# Patient Record
Sex: Male | Born: 1935 | Race: White | Hispanic: No | Marital: Married | Smoking: Never smoker
Health system: Southern US, Community
[De-identification: ages and names within clinical notes are randomized; demographics above are authoritative.]

## PROBLEM LIST (undated history)

## (undated) DIAGNOSIS — M1990 Unspecified osteoarthritis, unspecified site: Secondary | ICD-10-CM

## (undated) DIAGNOSIS — D649 Anemia, unspecified: Secondary | ICD-10-CM

## (undated) DIAGNOSIS — I82409 Acute embolism and thrombosis of unspecified deep veins of unspecified lower extremity: Secondary | ICD-10-CM

## (undated) DIAGNOSIS — N189 Chronic kidney disease, unspecified: Secondary | ICD-10-CM

## (undated) DIAGNOSIS — I679 Cerebrovascular disease, unspecified: Secondary | ICD-10-CM

## (undated) DIAGNOSIS — Z87442 Personal history of urinary calculi: Secondary | ICD-10-CM

## (undated) DIAGNOSIS — R7301 Impaired fasting glucose: Secondary | ICD-10-CM

## (undated) DIAGNOSIS — Z7901 Long term (current) use of anticoagulants: Secondary | ICD-10-CM

## (undated) DIAGNOSIS — I2699 Other pulmonary embolism without acute cor pulmonale: Secondary | ICD-10-CM

## (undated) DIAGNOSIS — I482 Chronic atrial fibrillation, unspecified: Secondary | ICD-10-CM

## (undated) DIAGNOSIS — E785 Hyperlipidemia, unspecified: Secondary | ICD-10-CM

## (undated) DIAGNOSIS — I639 Cerebral infarction, unspecified: Secondary | ICD-10-CM

## (undated) DIAGNOSIS — K219 Gastro-esophageal reflux disease without esophagitis: Secondary | ICD-10-CM

## (undated) DIAGNOSIS — I499 Cardiac arrhythmia, unspecified: Secondary | ICD-10-CM

## (undated) DIAGNOSIS — I10 Essential (primary) hypertension: Secondary | ICD-10-CM

## (undated) DIAGNOSIS — B029 Zoster without complications: Secondary | ICD-10-CM

## (undated) HISTORY — DX: Anemia, unspecified: D64.9

## (undated) HISTORY — DX: Cerebrovascular disease, unspecified: I67.9

## (undated) HISTORY — PX: EYE SURGERY: SHX253

## (undated) HISTORY — PX: HEMORRHOID SURGERY: SHX153

## (undated) HISTORY — PX: JOINT REPLACEMENT: SHX530

## (undated) HISTORY — DX: Impaired fasting glucose: R73.01

## (undated) HISTORY — DX: Long term (current) use of anticoagulants: Z79.01

## (undated) HISTORY — PX: RENAL ARTERY STENT: SHX2321

## (undated) HISTORY — DX: Chronic kidney disease, unspecified: N18.9

## (undated) HISTORY — PX: HIP FRACTURE SURGERY: SHX118

## (undated) HISTORY — DX: Essential (primary) hypertension: I10

## (undated) HISTORY — PX: APPENDECTOMY: SHX54

## (undated) HISTORY — PX: CHOLECYSTECTOMY: SHX55

## (undated) HISTORY — DX: Chronic atrial fibrillation, unspecified: I48.20

## (undated) HISTORY — PX: COLONOSCOPY: SHX174

## (undated) HISTORY — PX: WISDOM TOOTH EXTRACTION: SHX21

## (undated) HISTORY — DX: Hyperlipidemia, unspecified: E78.5

## (undated) HISTORY — DX: Zoster without complications: B02.9

---

## 2001-03-09 ENCOUNTER — Emergency Department (HOSPITAL_COMMUNITY): Admission: EM | Admit: 2001-03-09 | Discharge: 2001-03-09 | Payer: Self-pay | Admitting: Emergency Medicine

## 2001-03-12 ENCOUNTER — Emergency Department (HOSPITAL_COMMUNITY): Admission: EM | Admit: 2001-03-12 | Discharge: 2001-03-12 | Payer: Self-pay | Admitting: Internal Medicine

## 2001-03-17 ENCOUNTER — Emergency Department (HOSPITAL_COMMUNITY): Admission: EM | Admit: 2001-03-17 | Discharge: 2001-03-17 | Payer: Self-pay | Admitting: *Deleted

## 2001-03-22 ENCOUNTER — Emergency Department (HOSPITAL_COMMUNITY): Admission: EM | Admit: 2001-03-22 | Discharge: 2001-03-22 | Payer: Self-pay | Admitting: *Deleted

## 2002-08-01 ENCOUNTER — Encounter: Payer: Self-pay | Admitting: Orthopedic Surgery

## 2002-08-06 ENCOUNTER — Inpatient Hospital Stay (HOSPITAL_COMMUNITY): Admission: RE | Admit: 2002-08-06 | Discharge: 2002-08-08 | Payer: Self-pay | Admitting: Orthopedic Surgery

## 2002-09-27 ENCOUNTER — Other Ambulatory Visit: Admission: RE | Admit: 2002-09-27 | Discharge: 2002-09-27 | Payer: Self-pay | Admitting: Dermatology

## 2002-11-27 ENCOUNTER — Ambulatory Visit (HOSPITAL_COMMUNITY): Admission: RE | Admit: 2002-11-27 | Discharge: 2002-11-27 | Payer: Self-pay | Admitting: Orthopedic Surgery

## 2002-11-27 ENCOUNTER — Encounter: Payer: Self-pay | Admitting: Orthopedic Surgery

## 2003-02-27 ENCOUNTER — Encounter: Payer: Self-pay | Admitting: Neurosurgery

## 2003-03-01 ENCOUNTER — Inpatient Hospital Stay (HOSPITAL_COMMUNITY): Admission: RE | Admit: 2003-03-01 | Discharge: 2003-03-02 | Payer: Self-pay | Admitting: Neurosurgery

## 2003-03-01 ENCOUNTER — Encounter: Payer: Self-pay | Admitting: Neurosurgery

## 2003-03-21 ENCOUNTER — Encounter: Payer: Self-pay | Admitting: Neurosurgery

## 2003-03-21 ENCOUNTER — Ambulatory Visit (HOSPITAL_COMMUNITY): Admission: RE | Admit: 2003-03-21 | Discharge: 2003-03-21 | Payer: Self-pay | Admitting: Neurosurgery

## 2003-05-01 ENCOUNTER — Inpatient Hospital Stay (HOSPITAL_COMMUNITY): Admission: RE | Admit: 2003-05-01 | Discharge: 2003-05-06 | Payer: Self-pay | Admitting: Orthopedic Surgery

## 2003-05-06 ENCOUNTER — Inpatient Hospital Stay (HOSPITAL_COMMUNITY)
Admission: RE | Admit: 2003-05-06 | Discharge: 2003-05-13 | Payer: Self-pay | Admitting: Physical Medicine & Rehabilitation

## 2003-07-01 ENCOUNTER — Encounter (HOSPITAL_COMMUNITY): Admission: RE | Admit: 2003-07-01 | Discharge: 2003-07-31 | Payer: Self-pay | Admitting: Orthopedic Surgery

## 2003-09-16 ENCOUNTER — Ambulatory Visit (HOSPITAL_COMMUNITY): Admission: RE | Admit: 2003-09-16 | Discharge: 2003-09-16 | Payer: Self-pay | Admitting: *Deleted

## 2003-10-03 ENCOUNTER — Ambulatory Visit (HOSPITAL_COMMUNITY): Admission: RE | Admit: 2003-10-03 | Discharge: 2003-10-03 | Payer: Self-pay | Admitting: *Deleted

## 2003-10-27 ENCOUNTER — Emergency Department (HOSPITAL_COMMUNITY): Admission: EM | Admit: 2003-10-27 | Discharge: 2003-10-27 | Payer: Self-pay | Admitting: Emergency Medicine

## 2003-11-05 ENCOUNTER — Ambulatory Visit (HOSPITAL_COMMUNITY): Admission: RE | Admit: 2003-11-05 | Discharge: 2003-11-05 | Payer: Self-pay | Admitting: Ophthalmology

## 2003-12-26 ENCOUNTER — Ambulatory Visit (HOSPITAL_COMMUNITY): Admission: RE | Admit: 2003-12-26 | Discharge: 2003-12-26 | Payer: Self-pay | Admitting: Internal Medicine

## 2004-03-16 ENCOUNTER — Ambulatory Visit (HOSPITAL_BASED_OUTPATIENT_CLINIC_OR_DEPARTMENT_OTHER): Admission: RE | Admit: 2004-03-16 | Discharge: 2004-03-16 | Payer: Self-pay | Admitting: Urology

## 2004-05-18 ENCOUNTER — Ambulatory Visit: Payer: Self-pay | Admitting: *Deleted

## 2004-06-15 ENCOUNTER — Ambulatory Visit: Payer: Self-pay | Admitting: Internal Medicine

## 2004-07-01 ENCOUNTER — Ambulatory Visit: Payer: Self-pay | Admitting: *Deleted

## 2004-07-20 ENCOUNTER — Inpatient Hospital Stay (HOSPITAL_COMMUNITY): Admission: RE | Admit: 2004-07-20 | Discharge: 2004-07-24 | Payer: Self-pay | Admitting: Orthopedic Surgery

## 2004-07-23 ENCOUNTER — Ambulatory Visit: Payer: Self-pay | Admitting: Physical Medicine & Rehabilitation

## 2004-07-24 ENCOUNTER — Inpatient Hospital Stay
Admission: RE | Admit: 2004-07-24 | Discharge: 2004-08-05 | Payer: Self-pay | Admitting: Physical Medicine & Rehabilitation

## 2004-08-18 ENCOUNTER — Ambulatory Visit: Payer: Self-pay | Admitting: *Deleted

## 2004-09-02 ENCOUNTER — Ambulatory Visit: Payer: Self-pay | Admitting: *Deleted

## 2004-09-17 ENCOUNTER — Ambulatory Visit: Payer: Self-pay | Admitting: *Deleted

## 2004-10-16 ENCOUNTER — Ambulatory Visit: Payer: Self-pay | Admitting: Cardiovascular Disease

## 2004-11-16 ENCOUNTER — Ambulatory Visit: Payer: Self-pay | Admitting: *Deleted

## 2004-12-01 ENCOUNTER — Ambulatory Visit: Payer: Self-pay | Admitting: Internal Medicine

## 2004-12-11 ENCOUNTER — Ambulatory Visit: Payer: Self-pay | Admitting: *Deleted

## 2005-01-08 ENCOUNTER — Ambulatory Visit: Payer: Self-pay | Admitting: *Deleted

## 2005-01-25 ENCOUNTER — Ambulatory Visit: Payer: Self-pay | Admitting: *Deleted

## 2005-03-03 ENCOUNTER — Ambulatory Visit: Payer: Self-pay | Admitting: *Deleted

## 2005-03-31 ENCOUNTER — Ambulatory Visit: Payer: Self-pay | Admitting: *Deleted

## 2005-04-28 ENCOUNTER — Ambulatory Visit: Payer: Self-pay | Admitting: *Deleted

## 2005-05-31 ENCOUNTER — Ambulatory Visit: Payer: Self-pay | Admitting: Cardiology

## 2005-06-29 ENCOUNTER — Ambulatory Visit: Payer: Self-pay | Admitting: *Deleted

## 2005-07-22 ENCOUNTER — Encounter (HOSPITAL_COMMUNITY): Admission: RE | Admit: 2005-07-22 | Discharge: 2005-08-21 | Payer: Self-pay | Admitting: Orthopedic Surgery

## 2005-08-03 ENCOUNTER — Ambulatory Visit: Payer: Self-pay | Admitting: *Deleted

## 2005-08-31 ENCOUNTER — Ambulatory Visit: Payer: Self-pay | Admitting: *Deleted

## 2005-10-05 ENCOUNTER — Ambulatory Visit: Payer: Self-pay | Admitting: *Deleted

## 2005-11-04 ENCOUNTER — Ambulatory Visit: Payer: Self-pay | Admitting: *Deleted

## 2005-12-07 ENCOUNTER — Ambulatory Visit: Payer: Self-pay | Admitting: *Deleted

## 2006-01-05 ENCOUNTER — Ambulatory Visit: Payer: Self-pay | Admitting: Cardiology

## 2006-02-08 ENCOUNTER — Ambulatory Visit: Payer: Self-pay | Admitting: *Deleted

## 2006-02-18 ENCOUNTER — Ambulatory Visit: Payer: Self-pay | Admitting: Cardiology

## 2006-02-21 ENCOUNTER — Inpatient Hospital Stay (HOSPITAL_COMMUNITY): Admission: RE | Admit: 2006-02-21 | Discharge: 2006-02-25 | Payer: Self-pay | Admitting: Orthopedic Surgery

## 2006-02-21 ENCOUNTER — Ambulatory Visit: Payer: Self-pay | Admitting: Internal Medicine

## 2006-03-03 ENCOUNTER — Encounter (INDEPENDENT_AMBULATORY_CARE_PROVIDER_SITE_OTHER): Payer: Self-pay | Admitting: *Deleted

## 2006-03-03 LAB — CONVERTED CEMR LAB
BUN: 17 mg/dL
CO2: 27 meq/L
Calcium: 8.8 mg/dL
Chloride: 100 meq/L
Creatinine, Ser: 0.95 mg/dL
Glucose, Bld: 95 mg/dL
Potassium: 4.1 meq/L
Sodium: 139 meq/L

## 2006-03-24 ENCOUNTER — Ambulatory Visit: Payer: Self-pay | Admitting: Cardiology

## 2006-04-07 ENCOUNTER — Ambulatory Visit: Payer: Self-pay | Admitting: Cardiology

## 2006-04-21 ENCOUNTER — Ambulatory Visit: Payer: Self-pay | Admitting: Cardiology

## 2006-05-05 ENCOUNTER — Emergency Department (HOSPITAL_COMMUNITY): Admission: EM | Admit: 2006-05-05 | Discharge: 2006-05-05 | Payer: Self-pay | Admitting: Emergency Medicine

## 2006-05-24 ENCOUNTER — Ambulatory Visit: Payer: Self-pay | Admitting: Cardiovascular Disease

## 2006-06-23 ENCOUNTER — Ambulatory Visit: Payer: Self-pay | Admitting: Cardiology

## 2006-07-28 ENCOUNTER — Ambulatory Visit: Payer: Self-pay | Admitting: Cardiology

## 2006-09-02 ENCOUNTER — Ambulatory Visit: Payer: Self-pay | Admitting: Cardiology

## 2006-09-13 ENCOUNTER — Inpatient Hospital Stay (HOSPITAL_COMMUNITY): Admission: AD | Admit: 2006-09-13 | Discharge: 2006-09-19 | Payer: Self-pay | Admitting: Pulmonary Disease

## 2006-09-14 ENCOUNTER — Ambulatory Visit: Payer: Self-pay | Admitting: Cardiology

## 2006-09-20 ENCOUNTER — Encounter (HOSPITAL_COMMUNITY): Admission: RE | Admit: 2006-09-20 | Discharge: 2006-10-20 | Payer: Self-pay | Admitting: Pulmonary Disease

## 2006-09-20 ENCOUNTER — Ambulatory Visit (HOSPITAL_COMMUNITY): Payer: Self-pay | Admitting: Pulmonary Disease

## 2006-09-29 ENCOUNTER — Ambulatory Visit: Payer: Self-pay | Admitting: Cardiology

## 2006-10-07 ENCOUNTER — Ambulatory Visit: Payer: Self-pay | Admitting: Cardiology

## 2006-11-04 ENCOUNTER — Ambulatory Visit: Payer: Self-pay | Admitting: Cardiology

## 2006-11-18 ENCOUNTER — Ambulatory Visit: Payer: Self-pay | Admitting: Internal Medicine

## 2006-12-20 ENCOUNTER — Ambulatory Visit: Payer: Self-pay | Admitting: Cardiology

## 2007-01-03 ENCOUNTER — Ambulatory Visit: Payer: Self-pay | Admitting: Cardiovascular Disease

## 2007-01-20 ENCOUNTER — Emergency Department (HOSPITAL_COMMUNITY): Admission: EM | Admit: 2007-01-20 | Discharge: 2007-01-20 | Payer: Self-pay | Admitting: Emergency Medicine

## 2007-02-03 ENCOUNTER — Ambulatory Visit: Payer: Self-pay | Admitting: Cardiology

## 2007-03-07 ENCOUNTER — Ambulatory Visit: Payer: Self-pay | Admitting: Cardiology

## 2007-04-04 ENCOUNTER — Ambulatory Visit: Payer: Self-pay | Admitting: Cardiovascular Disease

## 2007-05-08 ENCOUNTER — Ambulatory Visit: Payer: Self-pay | Admitting: Cardiology

## 2007-06-05 ENCOUNTER — Ambulatory Visit: Payer: Self-pay | Admitting: Cardiology

## 2007-07-03 ENCOUNTER — Ambulatory Visit: Payer: Self-pay | Admitting: Cardiology

## 2007-08-01 ENCOUNTER — Ambulatory Visit: Payer: Self-pay | Admitting: Cardiology

## 2007-08-15 ENCOUNTER — Ambulatory Visit: Payer: Self-pay | Admitting: Cardiovascular Disease

## 2007-08-23 ENCOUNTER — Ambulatory Visit (HOSPITAL_COMMUNITY): Admission: RE | Admit: 2007-08-23 | Discharge: 2007-08-24 | Payer: Self-pay | Admitting: General Surgery

## 2007-08-28 ENCOUNTER — Emergency Department (HOSPITAL_COMMUNITY): Admission: EM | Admit: 2007-08-28 | Discharge: 2007-08-28 | Payer: Self-pay | Admitting: Emergency Medicine

## 2007-09-05 ENCOUNTER — Ambulatory Visit: Payer: Self-pay | Admitting: Cardiology

## 2007-10-03 ENCOUNTER — Ambulatory Visit: Payer: Self-pay | Admitting: Cardiology

## 2007-11-01 ENCOUNTER — Ambulatory Visit: Payer: Self-pay | Admitting: Cardiology

## 2007-11-29 ENCOUNTER — Ambulatory Visit: Payer: Self-pay | Admitting: Cardiology

## 2008-01-01 ENCOUNTER — Ambulatory Visit: Payer: Self-pay | Admitting: Cardiology

## 2008-01-26 ENCOUNTER — Ambulatory Visit: Payer: Self-pay | Admitting: Cardiology

## 2008-02-22 ENCOUNTER — Ambulatory Visit: Payer: Self-pay | Admitting: Cardiology

## 2008-03-07 ENCOUNTER — Ambulatory Visit: Payer: Self-pay | Admitting: Cardiology

## 2008-03-28 ENCOUNTER — Ambulatory Visit: Payer: Self-pay | Admitting: Cardiology

## 2008-04-11 ENCOUNTER — Ambulatory Visit: Payer: Self-pay | Admitting: Cardiology

## 2008-04-25 ENCOUNTER — Ambulatory Visit: Payer: Self-pay | Admitting: Cardiology

## 2008-05-27 ENCOUNTER — Ambulatory Visit: Payer: Self-pay | Admitting: Cardiology

## 2008-06-24 ENCOUNTER — Ambulatory Visit: Payer: Self-pay | Admitting: Cardiology

## 2008-06-27 ENCOUNTER — Inpatient Hospital Stay (HOSPITAL_COMMUNITY): Admission: EM | Admit: 2008-06-27 | Discharge: 2008-07-02 | Payer: Self-pay | Admitting: Emergency Medicine

## 2008-07-01 ENCOUNTER — Ambulatory Visit: Payer: Self-pay | Admitting: Internal Medicine

## 2008-07-02 ENCOUNTER — Ambulatory Visit: Payer: Self-pay | Admitting: Gastroenterology

## 2008-07-03 ENCOUNTER — Ambulatory Visit (HOSPITAL_COMMUNITY): Admission: RE | Admit: 2008-07-03 | Discharge: 2008-07-03 | Payer: Self-pay | Admitting: Gastroenterology

## 2008-07-04 ENCOUNTER — Ambulatory Visit: Payer: Self-pay | Admitting: Gastroenterology

## 2008-07-18 ENCOUNTER — Ambulatory Visit: Payer: Self-pay | Admitting: Cardiology

## 2008-07-25 ENCOUNTER — Ambulatory Visit: Payer: Self-pay | Admitting: Cardiology

## 2008-07-26 ENCOUNTER — Ambulatory Visit: Payer: Self-pay | Admitting: Cardiology

## 2008-08-22 ENCOUNTER — Ambulatory Visit: Payer: Self-pay | Admitting: Cardiology

## 2008-09-23 ENCOUNTER — Ambulatory Visit: Payer: Self-pay | Admitting: Cardiology

## 2008-10-21 ENCOUNTER — Ambulatory Visit: Payer: Self-pay | Admitting: Cardiology

## 2008-11-07 ENCOUNTER — Ambulatory Visit: Payer: Self-pay | Admitting: Cardiology

## 2008-12-05 ENCOUNTER — Ambulatory Visit: Payer: Self-pay | Admitting: Cardiology

## 2008-12-24 LAB — CONVERTED CEMR LAB
ALT: 13 units/L
AST: 17 units/L
Albumin: 3.7 g/dL
Alkaline Phosphatase: 105 units/L
BUN: 21 mg/dL
CO2: 28 meq/L
Calcium: 9.2 mg/dL
Chloride: 104 meq/L
Creatinine, Ser: 1.57 mg/dL
Glucose, Bld: 86 mg/dL
HCT: 33.6 %
Hemoglobin: 11.3 g/dL
MCV: 96.8 fL
Platelets: 152 10*3/uL
Potassium: 4.3 meq/L
Sodium: 143 meq/L
Total Protein: 6.4 g/dL
WBC: 6.7 10*3/uL

## 2009-01-02 ENCOUNTER — Ambulatory Visit: Payer: Self-pay | Admitting: Cardiology

## 2009-02-05 ENCOUNTER — Encounter: Payer: Self-pay | Admitting: Cardiology

## 2009-02-05 ENCOUNTER — Ambulatory Visit: Payer: Self-pay | Admitting: Cardiology

## 2009-02-10 ENCOUNTER — Encounter: Payer: Self-pay | Admitting: *Deleted

## 2009-02-19 ENCOUNTER — Ambulatory Visit: Payer: Self-pay

## 2009-02-19 LAB — CONVERTED CEMR LAB: POC INR: 2.9

## 2009-03-19 ENCOUNTER — Ambulatory Visit: Payer: Self-pay | Admitting: Cardiology

## 2009-03-19 LAB — CONVERTED CEMR LAB: POC INR: 3.2

## 2009-04-17 ENCOUNTER — Ambulatory Visit: Payer: Self-pay | Admitting: Cardiology

## 2009-04-17 LAB — CONVERTED CEMR LAB: POC INR: 1.5

## 2009-04-30 ENCOUNTER — Ambulatory Visit: Payer: Self-pay | Admitting: Cardiology

## 2009-04-30 LAB — CONVERTED CEMR LAB: POC INR: 3

## 2009-05-29 ENCOUNTER — Ambulatory Visit: Payer: Self-pay | Admitting: Cardiology

## 2009-05-29 LAB — CONVERTED CEMR LAB: POC INR: 2.9

## 2009-06-16 DIAGNOSIS — N498 Inflammatory disorders of other specified male genital organs: Secondary | ICD-10-CM

## 2009-06-16 DIAGNOSIS — K449 Diaphragmatic hernia without obstruction or gangrene: Secondary | ICD-10-CM | POA: Insufficient documentation

## 2009-06-16 DIAGNOSIS — M109 Gout, unspecified: Secondary | ICD-10-CM

## 2009-06-17 ENCOUNTER — Encounter (INDEPENDENT_AMBULATORY_CARE_PROVIDER_SITE_OTHER): Payer: Self-pay | Admitting: *Deleted

## 2009-06-18 ENCOUNTER — Ambulatory Visit: Payer: Self-pay | Admitting: Cardiology

## 2009-06-18 DIAGNOSIS — B029 Zoster without complications: Secondary | ICD-10-CM | POA: Insufficient documentation

## 2009-06-18 LAB — CONVERTED CEMR LAB
ALT: 11 units/L (ref 0–53)
AST: 18 units/L (ref 0–37)
Albumin: 4.2 g/dL (ref 3.5–5.2)
Alkaline Phosphatase: 89 units/L (ref 39–117)
BUN: 24 mg/dL — ABNORMAL HIGH (ref 6–23)
Basophils Absolute: 0 10*3/uL (ref 0.0–0.1)
Basophils Relative: 0 % (ref 0–1)
CO2: 30 meq/L (ref 19–32)
Calcium: 9.8 mg/dL (ref 8.4–10.5)
Chloride: 103 meq/L (ref 96–112)
Creatinine, Ser: 1.46 mg/dL (ref 0.40–1.50)
Eosinophils Absolute: 0.1 10*3/uL (ref 0.0–0.7)
Eosinophils Relative: 2 % (ref 0–5)
Glucose, Bld: 70 mg/dL (ref 70–99)
HCT: 38.5 % — ABNORMAL LOW (ref 39.0–52.0)
Hemoglobin: 12.4 g/dL — ABNORMAL LOW (ref 13.0–17.0)
Lymphocytes Relative: 29 % (ref 12–46)
Lymphs Abs: 2.3 10*3/uL (ref 0.7–4.0)
MCHC: 32.2 g/dL (ref 30.0–36.0)
MCV: 100 fL (ref 78.0–100.0)
Monocytes Absolute: 0.9 10*3/uL (ref 0.1–1.0)
Monocytes Relative: 11 % (ref 3–12)
Neutro Abs: 4.6 10*3/uL (ref 1.7–7.7)
Neutrophils Relative %: 58 % (ref 43–77)
Platelets: 186 10*3/uL (ref 150–400)
Potassium: 4.8 meq/L (ref 3.5–5.3)
RBC: 3.85 M/uL — ABNORMAL LOW (ref 4.22–5.81)
RDW: 14.8 % (ref 11.5–15.5)
Sodium: 143 meq/L (ref 135–145)
Total Bilirubin: 0.5 mg/dL (ref 0.3–1.2)
Total Protein: 7 g/dL (ref 6.0–8.3)
WBC: 7.9 10*3/uL (ref 4.0–10.5)

## 2009-06-19 ENCOUNTER — Encounter (INDEPENDENT_AMBULATORY_CARE_PROVIDER_SITE_OTHER): Payer: Self-pay | Admitting: *Deleted

## 2009-06-24 ENCOUNTER — Encounter (INDEPENDENT_AMBULATORY_CARE_PROVIDER_SITE_OTHER): Payer: Self-pay | Admitting: *Deleted

## 2009-06-24 LAB — CONVERTED CEMR LAB
OCCULT 1: NEGATIVE
OCCULT 2: NEGATIVE
OCCULT 3: NEGATIVE

## 2009-06-28 DIAGNOSIS — B029 Zoster without complications: Secondary | ICD-10-CM

## 2009-06-28 HISTORY — DX: Zoster without complications: B02.9

## 2009-06-30 ENCOUNTER — Ambulatory Visit: Payer: Self-pay | Admitting: Cardiology

## 2009-06-30 LAB — CONVERTED CEMR LAB: POC INR: 1.9

## 2009-07-01 ENCOUNTER — Encounter (INDEPENDENT_AMBULATORY_CARE_PROVIDER_SITE_OTHER): Payer: Self-pay | Admitting: *Deleted

## 2009-07-28 ENCOUNTER — Ambulatory Visit: Payer: Self-pay | Admitting: Cardiology

## 2009-07-28 LAB — CONVERTED CEMR LAB: POC INR: 2.2

## 2009-08-25 ENCOUNTER — Ambulatory Visit: Payer: Self-pay | Admitting: Cardiology

## 2009-08-25 LAB — CONVERTED CEMR LAB: POC INR: 2.4

## 2009-09-22 ENCOUNTER — Ambulatory Visit: Payer: Self-pay | Admitting: Cardiology

## 2009-09-22 ENCOUNTER — Telehealth (INDEPENDENT_AMBULATORY_CARE_PROVIDER_SITE_OTHER): Payer: Self-pay | Admitting: *Deleted

## 2009-09-22 LAB — CONVERTED CEMR LAB: POC INR: 2.1

## 2009-10-20 ENCOUNTER — Ambulatory Visit: Payer: Self-pay | Admitting: Cardiology

## 2009-10-20 LAB — CONVERTED CEMR LAB: POC INR: 2.1

## 2009-11-17 ENCOUNTER — Ambulatory Visit: Payer: Self-pay | Admitting: Cardiology

## 2009-11-17 LAB — CONVERTED CEMR LAB: POC INR: 2.5

## 2009-12-15 ENCOUNTER — Ambulatory Visit: Payer: Self-pay | Admitting: Cardiovascular Disease

## 2010-01-19 ENCOUNTER — Ambulatory Visit: Payer: Self-pay | Admitting: Cardiology

## 2010-01-19 LAB — CONVERTED CEMR LAB: POC INR: 3.3

## 2010-02-16 ENCOUNTER — Ambulatory Visit: Payer: Self-pay | Admitting: Cardiology

## 2010-03-16 ENCOUNTER — Ambulatory Visit: Payer: Self-pay | Admitting: Cardiology

## 2010-04-13 ENCOUNTER — Ambulatory Visit: Payer: Self-pay | Admitting: Cardiology

## 2010-04-13 LAB — CONVERTED CEMR LAB: POC INR: 2.8

## 2010-05-11 ENCOUNTER — Ambulatory Visit: Payer: Self-pay | Admitting: Cardiology

## 2010-05-11 LAB — CONVERTED CEMR LAB: POC INR: 2.6

## 2010-06-08 ENCOUNTER — Ambulatory Visit: Payer: Self-pay | Admitting: Cardiology

## 2010-06-09 ENCOUNTER — Encounter: Payer: Self-pay | Admitting: Adult Health

## 2010-07-06 ENCOUNTER — Ambulatory Visit: Admission: RE | Admit: 2010-07-06 | Discharge: 2010-07-06 | Payer: Self-pay | Source: Home / Self Care

## 2010-07-06 LAB — CONVERTED CEMR LAB: POC INR: 2.3

## 2010-07-28 NOTE — Progress Notes (Signed)
  Phone Note Call from Patient   Caller: Patient Summary of Call: pt states that after he takes his fluid pill and lays back down in the morning, he has alot of difficulty voiding, it takes a long time to start a stream but then he goes about 800cc, it help if he stands up too.  He stated he saw Dr. Rito Ehrlich years ago for a cyst.  He was taking a whole 40mg  of furosemide but we had cut it in half last year but it was not documented in the December ov.  He is not having a problem with fluid just flow. Initial call taken by: Teressa Lower RN,  September 22, 2009 10:43 AM  Follow-up for Phone Call        Needs evaluation by PMD or Urology.  Goodman Bing, M.D.  Follow-up by: Kathlen Brunswick, MD, [facility],  September 24, 2009 9:08 AM  Additional Follow-up for Phone Call Additional follow up Details #1::        Gave pt information, he verbalized understanding. He has an appt next week with Dr. Juanetta Gosling Additional Follow-up by: Teressa Lower RN,  September 24, 2009 9:22 AM    New/Updated Medications: FUROSEMIDE 40 MG TABS (FUROSEMIDE) take 1/2 tablet by mouth daily

## 2010-07-28 NOTE — Medication Information (Signed)
Summary: ccr-lr  Anticoagulant Therapy  Managed by: Vashti Hey, RN PCP: Dr. Shaune Pollack Supervising MD: Dietrich Pates MD, Molly Maduro Indication 1: Atrial Fibrillation (ICD-427.31) Indication 2: Pulmonary Embolism and Infarction (ICD-415.1) Lab Used: [facility] [place] Site: [place] INR POC 2.4  Dietary changes: no    Health status changes: no    Bleeding/hemorrhagic complications: no    Recent/future hospitalizations: no    Any changes in medication regimen? no    Recent/future dental: no  Any missed doses?: no       Is patient compliant with meds? yes       Allergies: No Known Drug Allergies  Anticoagulation Management History:      The patient is taking warfarin and comes in today for a routine follow up visit.  Positive risk factors for bleeding include an age of 75 years or older.  The bleeding index is 'intermediate risk'.  Positive CHADS2 values include History of HTN and Age > 60 years old.  The start date was 10/21/2003.  Anticoagulation responsible provider: Dietrich Pates MD, Molly Maduro.  INR POC: 2.4.  Cuvette Lot#: [id number].  Exp: 10/11.    Anticoagulation Management Assessment/Plan:      The patient's current anticoagulation dose is Coumadin 5 mg tabs: TAKE AS DIRECTED.  The target INR is 2 - 3.  The next INR is due 09/22/2009.  Anticoagulation instructions were given to patient.  Results were reviewed/authorized by Vashti Hey, RN.  He was notified by Vashti Hey RN.         Prior Anticoagulation Instructions: INR 2.2 Continue coumadin 2.5mg  once daily except 5mg  on Mondays  Current Anticoagulation Instructions: INR 2.4 Continue coumadin 2.5mg  once daily except 5mg  on Mondays

## 2010-07-28 NOTE — Medication Information (Signed)
Summary: ccr-lr  Anticoagulant Therapy  Managed by: Vashti Hey, RN PCP: Dr. Shaune Pollack Supervising MD: Dietrich Pates MD, Molly Maduro Indication 1: Atrial Fibrillation (ICD-427.31) Indication 2: Pulmonary Embolism and Infarction (ICD-415.1) Lab Used: [facility] [place] Site: [place] INR POC 2.2  Dietary changes: no    Health status changes: no    Bleeding/hemorrhagic complications: no    Recent/future hospitalizations: no    Any changes in medication regimen? no    Recent/future dental: no  Any missed doses?: no       Is patient compliant with meds? yes       Allergies: No Known Drug Allergies  Anticoagulation Management History:      The patient is taking warfarin and comes in today for a routine follow up visit.  Positive risk factors for bleeding include an age of 75 years or older.  The bleeding index is 'intermediate risk'.  Positive CHADS2 values include History of HTN and Age > 108 years old.  The start date was 10/21/2003.  Anticoagulation responsible provider: Dietrich Pates MD, Molly Maduro.  INR POC: 2.2.  Cuvette Lot#: [id number].  Exp: 10/11.    Anticoagulation Management Assessment/Plan:      The patient's current anticoagulation dose is Coumadin 5 mg tabs: TAKE AS DIRECTED.  The target INR is 2 - 3.  The next INR is due 08/25/2009.  Anticoagulation instructions were given to patient.  Results were reviewed/authorized by Vashti Hey, RN.  He was notified by Vashti Hey RN.         Prior Anticoagulation Instructions: INR 1.9 Take coumadin 1 1/2 tablets today then resume 1/2 tablet once daily except 1 tablet on Mondays  Current Anticoagulation Instructions: INR 2.2 Continue coumadin 2.5mg  once daily except 5mg  on Mondays

## 2010-07-28 NOTE — Miscellaneous (Signed)
Summary: stool cards 06/24/2009  Clinical Lists Changes  Observations: Added new observation of HEMOCCULT 3: neg (06/24/2009 10:45) Added new observation of HEMOCCULT 2: neg (06/24/2009 10:45) Added new observation of HEMOCCULT 1: neg (06/24/2009 10:45)

## 2010-07-28 NOTE — Medication Information (Signed)
Summary: ccr-lr  Anticoagulant Therapy  Managed by: Weston Brass, PharmD PCP: Dr. Shaune Pollack Supervising MD: Eden Emms MD, Theron Arista Indication 1: Atrial Fibrillation (ICD-427.31) Indication 2: Pulmonary Embolism and Infarction (ICD-415.1) Lab Used: [facility] [place] Site:  INR POC 2.7 INR RANGE 2.0-3.0  Dietary changes: no    Health status changes: no    Bleeding/hemorrhagic complications: no    Recent/future hospitalizations: no    Any changes in medication regimen? no    Recent/future dental: no  Any missed doses?: no       Is patient compliant with meds? yes       Allergies: No Known Drug Allergies  Anticoagulation Management History:      The patient is taking warfarin and comes in today for a routine follow up visit.  Positive risk factors for bleeding include an age of 75 years or older.  The bleeding index is 'intermediate risk'.  Positive CHADS2 values include History of HTN and Age > 1 years old.  The start date was 10/21/2003.  Anticoagulation responsible provider: Eden Emms MD, Theron Arista.  INR POC: 2.7.  Cuvette Lot#: [id number].  Exp: 01/2011.    Anticoagulation Management Assessment/Plan:      The patient's current anticoagulation dose is Coumadin 5 mg tabs: TAKE AS DIRECTED.  The target INR is 2 - 3.  The next INR is due 01/19/2010.  Anticoagulation instructions were given to patient.  Results were reviewed/authorized by Weston Brass, PharmD.  He was notified by Weston Brass PharmD.         Prior Anticoagulation Instructions: INR 2.5 Continue coumadin 2.5mg  once daily except 5mg  on Mondays  Current Anticoagulation Instructions: INR 2.7  Continue same dose of 1/2 tablet every day except 1 tablet on Monday.

## 2010-07-28 NOTE — Medication Information (Signed)
Summary: ccr-lr  Anticoagulant Therapy  Managed by: Vashti Hey, RN PCP: Dr. Shaune Pollack Supervising MD: Dietrich Pates MD, Molly Maduro Indication 1: Atrial Fibrillation (ICD-427.31) Indication 2: Pulmonary Embolism and Infarction (ICD-415.1) Lab Used: [facility] [place] Site: [place] INR POC 2.5  Dietary changes: no    Health status changes: no    Bleeding/hemorrhagic complications: no    Recent/future hospitalizations: no    Any changes in medication regimen? no    Recent/future dental: no  Any missed doses?: no       Is patient compliant with meds? yes       Allergies: No Known Drug Allergies  Anticoagulation Management History:      The patient is taking warfarin and comes in today for a routine follow up visit.  Positive risk factors for bleeding include an age of 75 years or older.  The bleeding index is 'intermediate risk'.  Positive CHADS2 values include History of HTN and Age > 40 years old.  The start date was 10/21/2003.  Anticoagulation responsible provider: Dietrich Pates MD, Molly Maduro.  INR POC: 2.5.  Cuvette Lot#: [id number].  Exp: 10/11.    Anticoagulation Management Assessment/Plan:      The patient's current anticoagulation dose is Coumadin 5 mg tabs: TAKE AS DIRECTED.  The target INR is 2 - 3.  The next INR is due 12/15/2009.  Anticoagulation instructions were given to patient.  Results were reviewed/authorized by Vashti Hey, RN.  He was notified by Vashti Hey RN.         Prior Anticoagulation Instructions: INR 2.1 Continue coumadin 2.5mg  once daily except 5mg  on Mondays  Current Anticoagulation Instructions: INR 2.5 Continue coumadin 2.5mg  once daily except 5mg  on Mondays

## 2010-07-28 NOTE — Medication Information (Signed)
Summary: rov/sp  Anticoagulant Therapy  Managed by: Vashti Hey, RN PCP: Dr. Shaune Pollack Supervising MD: Dietrich Pates MD, Molly Maduro Indication 1: Atrial Fibrillation (ICD-427.31) Indication 2: Pulmonary Embolism and Infarction (ICD-415.1) Lab Used: [facility]  Site: [place] INR POC 3.3 INR RANGE 2.0-3.0  Dietary changes: no    Health status changes: no    Bleeding/hemorrhagic complications: no    Recent/future hospitalizations: no    Any changes in medication regimen? no    Recent/future dental: no  Any missed doses?: no       Is patient compliant with meds? yes       Allergies: No Known Drug Allergies  Anticoagulation Management History:      The patient is taking warfarin and comes in today for a routine follow up visit.  Positive risk factors for bleeding include an age of 19 years or older.  The bleeding index is 'intermediate risk'.  Positive CHADS2 values include History of HTN and Age > 76 years old.  The start date was 10/21/2003.  Anticoagulation responsible provider: Dietrich Pates MD, Molly Maduro.  INR POC: 3.3.  Cuvette Lot#: [id number].  Exp: 01/2011.    Anticoagulation Management Assessment/Plan:      The patient's current anticoagulation dose is Coumadin 5 mg tabs: TAKE AS DIRECTED.  The target INR is 2 - 3.  The next INR is due 02/16/2010.  Anticoagulation instructions were given to patient.  Results were reviewed/authorized by Vashti Hey, RN.  He was notified by Vashti Hey RN.         Prior Anticoagulation Instructions: INR 2.7  Continue same dose of 1/2 tablet every day except 1 tablet on Monday.   Current Anticoagulation Instructions: INR 3.3 Hold coumadin tonight then resume 1/2 tablet once daily except 1 tablet on Mondays

## 2010-07-28 NOTE — Medication Information (Signed)
Summary: ccr-lr  Anticoagulant Therapy  Managed by: Vashti Hey, RN PCP: Dr. Shaune Pollack Supervising MD: Daleen Squibb MD, Maisie Fus Indication 1: Atrial Fibrillation (ICD-427.31) Indication 2: Pulmonary Embolism and Infarction (ICD-415.1) Lab Used: [facility] [place] Site: [place] INR POC 2.7 INR RANGE 2.0-3.0  Dietary changes: no    Health status changes: no    Bleeding/hemorrhagic complications: no    Recent/future hospitalizations: no    Any changes in medication regimen? no    Recent/future dental: no  Any missed doses?: no       Is patient compliant with meds? yes       Allergies: No Known Drug Allergies  Anticoagulation Management History:      The patient is taking warfarin and comes in today for a routine follow up visit.  Positive risk factors for bleeding include an age of 75 years or older.  The bleeding index is 'intermediate risk'.  Positive CHADS2 values include History of HTN and Age > 51 years old.  The start date was 10/21/2003.  Anticoagulation responsible provider: Daleen Squibb MD, Maisie Fus.  INR POC: 2.7.  Cuvette Lot#: [id number].  Exp: 01/2011.    Anticoagulation Management Assessment/Plan:      The patient's current anticoagulation dose is Coumadin 5 mg tabs: TAKE AS DIRECTED.  The target INR is 2 - 3.  The next INR is due 03/16/2010.  Anticoagulation instructions were given to patient.  Results were reviewed/authorized by Vashti Hey, RN.  He was notified by Vashti Hey RN.         Prior Anticoagulation Instructions: INR 3.3 Hold coumadin tonight then resume 1/2 tablet once daily except 1 tablet on Mondays  Current Anticoagulation Instructions: INR 2.7 Continue coumadin 2.5mg  once daily except 5mg  on Mondays

## 2010-07-28 NOTE — Medication Information (Signed)
Summary: ccr-lr  Anticoagulant Therapy  Managed by: Vashti Hey, RN PCP: Dr. Shaune Pollack Supervising MD: Diona Browner MD, Remi Deter Indication 1: Atrial Fibrillation (ICD-427.31) Indication 2: Pulmonary Embolism and Infarction (ICD-415.1) Lab Used: [facility] [place] Site: [place] INR POC 2.8 INR RANGE 2.0-3.0  Dietary changes: no    Health status changes: no    Bleeding/hemorrhagic complications: no    Recent/future hospitalizations: no    Any changes in medication regimen? no    Recent/future dental: no  Any missed doses?: yes     Details: missed 1/2 dose coumadin 2 weeks ago  Is patient compliant with meds? yes       Allergies: No Known Drug Allergies  Anticoagulation Management History:      The patient is taking warfarin and comes in today for a routine follow up visit.  Positive risk factors for bleeding include an age of 74 years or older.  The bleeding index is 'intermediate risk'.  Positive CHADS2 values include History of HTN and Age > 56 years old.  The start date was 10/21/2003.  Anticoagulation responsible provider: Diona Browner MD, Remi Deter.  INR POC: 2.8.  Cuvette Lot#: [id number].  Exp: 01/2011.    Anticoagulation Management Assessment/Plan:      The patient's current anticoagulation dose is Coumadin 5 mg tabs: TAKE AS DIRECTED.  The target INR is 2 - 3.  The next INR is due 05/11/2010.  Anticoagulation instructions were given to patient.  Results were reviewed/authorized by Vashti Hey, RN.  He was notified by Vashti Hey RN.         Prior Anticoagulation Instructions: INR 2.8 Continue coumadin 2.5mg  once daily except 5mg  on Mondays  Current Anticoagulation Instructions: Same as Prior Instructions.

## 2010-07-28 NOTE — Medication Information (Signed)
Summary: ccr-lr  Anticoagulant Therapy  Managed by: Vashti Hey, RN PCP: Dr. Shaune Pollack Supervising MD: Dietrich Pates MD, Molly Maduro Indication 1: Atrial Fibrillation (ICD-427.31) Indication 2: Pulmonary Embolism and Infarction (ICD-415.1) Lab Used: [facility] [place] Site: [place] INR POC 2.8 INR RANGE 2.0-3.0  Dietary changes: no    Health status changes: no    Bleeding/hemorrhagic complications: no    Recent/future hospitalizations: no    Any changes in medication regimen? no    Recent/future dental: no  Any missed doses?: no       Is patient compliant with meds? yes       Allergies: No Known Drug Allergies  Anticoagulation Management History:      The patient is taking warfarin and comes in today for a routine follow up visit.  Positive risk factors for bleeding include an age of 75 years or older.  The bleeding index is 'intermediate risk'.  Positive CHADS2 values include History of HTN and Age > 22 years old.  The start date was 10/21/2003.  Anticoagulation responsible provider: Dietrich Pates MD, Molly Maduro.  INR POC: 2.8.  Cuvette Lot#: [id number].  Exp: 01/2011.    Anticoagulation Management Assessment/Plan:      The patient's current anticoagulation dose is Coumadin 5 mg tabs: TAKE AS DIRECTED.  The target INR is 2 - 3.  The next INR is due 04/13/2010.  Anticoagulation instructions were given to patient.  Results were reviewed/authorized by Vashti Hey, RN.  He was notified by Vashti Hey RN.         Prior Anticoagulation Instructions: INR 2.7 Continue coumadin 2.5mg  once daily except 5mg  on Mondays  Current Anticoagulation Instructions: INR 2.8 Continue coumadin 2.5mg  once daily except 5mg  on Mondays

## 2010-07-28 NOTE — Medication Information (Signed)
Summary: ccr-lr  Anticoagulant Therapy  Managed by: Vashti Hey, RN PCP: Dr. Shaune Pollack Supervising MD: Diona Browner MD, Remi Deter Indication 1: Atrial Fibrillation (ICD-427.31) Indication 2: Pulmonary Embolism and Infarction (ICD-415.1) Lab Used: [facility] [place] Site: [place] INR POC 1.9  Dietary changes: no    Health status changes: no    Bleeding/hemorrhagic complications: no    Recent/future hospitalizations: no    Any changes in medication regimen? no    Recent/future dental: no  Any missed doses?: no       Is patient compliant with meds? yes       Allergies: No Known Drug Allergies  Anticoagulation Management History:      The patient is taking warfarin and comes in today for a routine follow up visit.  Positive risk factors for bleeding include an age of 75 years or older.  The bleeding index is 'intermediate risk'.  Positive CHADS2 values include History of HTN and Age > 80 years old.  The start date was 10/21/2003.  Anticoagulation responsible provider: Diona Browner MD, Remi Deter.  INR POC: 1.9.  Cuvette Lot#: [id number].  Exp: 10/11.    Anticoagulation Management Assessment/Plan:      The patient's current anticoagulation dose is Coumadin 5 mg tabs: TAKE AS DIRECTED.  The target INR is 2 - 3.  The next INR is due 07/28/2009.  Anticoagulation instructions were given to patient.  Results were reviewed/authorized by Vashti Hey, RN.  He was notified by Vashti Hey RN.         Prior Anticoagulation Instructions: INR 2.9 Continue coumadin 2.5mg  once daily except 5mg  on Mondays  Current Anticoagulation Instructions: INR 1.9 Take coumadin 1 1/2 tablets today then resume 1/2 tablet once daily except 1 tablet on Mondays

## 2010-07-28 NOTE — Medication Information (Signed)
Summary: ccr-lr  Anticoagulant Therapy  Managed by: Vashti Hey, RN PCP: Dr. Shaune Pollack Supervising MD: Diona Browner MD, Remi Deter Indication 1: Atrial Fibrillation (ICD-427.31) Indication 2: Pulmonary Embolism and Infarction (ICD-415.1) Lab Used: [facility] [place] Site: [place] INR POC 2.1  Dietary changes: no    Health status changes: no    Bleeding/hemorrhagic complications: no    Recent/future hospitalizations: no    Any changes in medication regimen? no    Recent/future dental: no  Any missed doses?: no       Is patient compliant with meds? yes       Allergies: No Known Drug Allergies  Anticoagulation Management History:      The patient is taking warfarin and comes in today for a routine follow up visit.  Positive risk factors for bleeding include an age of 75 years or older.  The bleeding index is 'intermediate risk'.  Positive CHADS2 values include History of HTN and Age > 39 years old.  The start date was 10/21/2003.  Anticoagulation responsible provider: Diona Browner MD, Remi Deter.  INR POC: 2.1.  Cuvette Lot#: [id number].  Exp: 10/11.    Anticoagulation Management Assessment/Plan:      The patient's current anticoagulation dose is Coumadin 5 mg tabs: TAKE AS DIRECTED.  The target INR is 2 - 3.  The next INR is due 11/17/2009.  Anticoagulation instructions were given to patient.  Results were reviewed/authorized by Vashti Hey, RN.  He was notified by Vashti Hey RN.         Prior Anticoagulation Instructions: INR 2.1 Continue coumadin 2.5mg  once daily except 5mg  on Mondays  Current Anticoagulation Instructions: Same as Prior Instructions.

## 2010-07-28 NOTE — Medication Information (Signed)
Summary: ccr-lr  Anticoagulant Therapy  Managed by: Vashti Hey, RN PCP: Dr. Shaune Pollack Supervising MD: Dietrich Pates MD, Molly Maduro Indication 1: Atrial Fibrillation (ICD-427.31) Indication 2: Pulmonary Embolism and Infarction (ICD-415.1) Lab Used: [facility] [place] Site: [place] INR POC 2.6 INR RANGE 2.0-3.0  Dietary changes: no    Health status changes: no    Bleeding/hemorrhagic complications: no    Recent/future hospitalizations: no    Any changes in medication regimen? no    Recent/future dental: no  Any missed doses?: no       Is patient compliant with meds? yes       Allergies: No Known Drug Allergies  Anticoagulation Management History:      The patient is taking warfarin and comes in today for a routine follow up visit.  Positive risk factors for bleeding include an age of 75 years or older.  The bleeding index is 'intermediate risk'.  Positive CHADS2 values include History of HTN and Age > 2 years old.  The start date was 10/21/2003.  Anticoagulation responsible provider: Dietrich Pates MD, Molly Maduro.  INR POC: 2.6.  Cuvette Lot#: [id number].  Exp: 01/2011.    Anticoagulation Management Assessment/Plan:      The patient's current anticoagulation dose is Coumadin 5 mg tabs: TAKE AS DIRECTED.  The target INR is 2 - 3.  The next INR is due 06/08/2010.  Anticoagulation instructions were given to patient.  Results were reviewed/authorized by Vashti Hey, RN.  He was notified by Vashti Hey RN.         Prior Anticoagulation Instructions: INR 2.8 Continue coumadin 2.5mg  once daily except 5mg  on Mondays  Current Anticoagulation Instructions: INR 2.6 Continue coumadin 2.5mg  once daily except 5mg  on Mondays

## 2010-07-28 NOTE — Medication Information (Signed)
Summary: ccr-lr  Anticoagulant Therapy  Managed by: Vashti Hey, RN PCP: Dr. Shaune Pollack Supervising MD: Dietrich Pates MD, Molly Maduro Indication 1: Atrial Fibrillation (ICD-427.31) Indication 2: Pulmonary Embolism and Infarction (ICD-415.1) Lab Used: [facility] [place] Site: [place] INR POC 2.1  Dietary changes: no    Health status changes: no    Bleeding/hemorrhagic complications: no    Recent/future hospitalizations: no    Any changes in medication regimen? no    Recent/future dental: no  Any missed doses?: no       Is patient compliant with meds? yes       Allergies: No Known Drug Allergies  Anticoagulation Management History:      The patient is taking warfarin and comes in today for a routine follow up visit.  Positive risk factors for bleeding include an age of 75 years or older.  The bleeding index is 'intermediate risk'.  Positive CHADS2 values include History of HTN and Age > 90 years old.  The start date was 10/21/2003.  Anticoagulation responsible provider: Dietrich Pates MD, Molly Maduro.  INR POC: 2.1.  Cuvette Lot#: [id number].  Exp: 10/11.    Anticoagulation Management Assessment/Plan:      The patient's current anticoagulation dose is Coumadin 5 mg tabs: TAKE AS DIRECTED.  The target INR is 2 - 3.  The next INR is due 10/20/2009.  Anticoagulation instructions were given to patient.  Results were reviewed/authorized by Vashti Hey, RN.  He was notified by Vashti Hey RN.         Prior Anticoagulation Instructions: INR 2.4 Continue coumadin 2.5mg  once daily except 5mg  on Mondays  Current Anticoagulation Instructions: INR 2.1 Continue coumadin 2.5mg  once daily except 5mg  on Mondays

## 2010-07-30 NOTE — Medication Information (Signed)
Summary: ccr-lr  Anticoagulant Therapy  Managed by: Vashti Hey, RN PCP: Dr. Shaune Pollack Supervising MD: Dietrich Pates MD, Molly Maduro Indication 1: Atrial Fibrillation (ICD-427.31) Indication 2: Pulmonary Embolism and Infarction (ICD-415.1) Lab Used: [facility] [place] Site: [place] INR POC 2.3 INR RANGE 2.0-3.0  Dietary changes: no    Health status changes: no    Bleeding/hemorrhagic complications: no    Recent/future hospitalizations: no    Any changes in medication regimen? no    Recent/future dental: no  Any missed doses?: no       Is patient compliant with meds? yes       Allergies: No Known Drug Allergies  Anticoagulation Management History:      The patient is taking warfarin and comes in today for a routine follow up visit.  Positive risk factors for bleeding include an age of 75 years or older.  The bleeding index is 'intermediate risk'.  Positive CHADS2 values include History of HTN and Age > 15 years old.  The start date was 10/21/2003.  Anticoagulation responsible provider: Dietrich Pates MD, Molly Maduro.  INR POC: 2.3.  Cuvette Lot#: [id number].  Exp: 01/2011.    Anticoagulation Management Assessment/Plan:      The patient's current anticoagulation dose is Coumadin 5 mg tabs: TAKE AS DIRECTED.  The target INR is 2 - 3.  The next INR is due 08/03/2010.  Anticoagulation instructions were given to patient.  Results were reviewed/authorized by Vashti Hey, RN.  He was notified by Vashti Hey RN.         Prior Anticoagulation Instructions: INR 2.3 Continue coumadin 2.5mg  once daily exccept 5mg  on Mondays  Current Anticoagulation Instructions: INR 2.3 Continue coumadin 2.5mg  once daily except 5mg  on Mondays

## 2010-07-30 NOTE — Medication Information (Signed)
Summary: ccr-lr  Anticoagulant Therapy  Managed by: Vashti Hey, RN PCP: Dr. Shaune Pollack Supervising MD: Daleen Squibb MD, Maisie Fus Indication 1: Atrial Fibrillation (ICD-427.31) Indication 2: Pulmonary Embolism and Infarction (ICD-415.1) Lab Used: [facility] [place] Site: [place] INR POC 2.3 INR RANGE 2.0-3.0  Dietary changes: no    Health status changes: no    Bleeding/hemorrhagic complications: no    Recent/future hospitalizations: no    Any changes in medication regimen? no    Recent/future dental: no  Any missed doses?: no       Is patient compliant with meds? yes       Allergies: No Known Drug Allergies  Anticoagulation Management History:      The patient is taking warfarin and comes in today for a routine follow up visit.  Positive risk factors for bleeding include an age of 75 years or older.  The bleeding index is 'intermediate risk'.  Positive CHADS2 values include History of HTN and Age > 74 years old.  The start date was 10/21/2003.  Anticoagulation responsible provider: Daleen Squibb MD, Maisie Fus.  INR POC: 2.3.  Cuvette Lot#: [id number].  Exp: 01/2011.    Anticoagulation Management Assessment/Plan:      The patient's current anticoagulation dose is Coumadin 5 mg tabs: TAKE AS DIRECTED.  The target INR is 2 - 3.  The next INR is due 07/06/2010.  Anticoagulation instructions were given to patient.  Results were reviewed/authorized by Vashti Hey, RN.  He was notified by Vashti Hey RN.         Prior Anticoagulation Instructions: INR 2.6 Continue coumadin 2.5mg  once daily except 5mg  on Mondays  Current Anticoagulation Instructions: INR 2.3 Continue coumadin 2.5mg  once daily exccept 5mg  on Mondays

## 2010-07-30 NOTE — Assessment & Plan Note (Signed)
Summary: 1 YR F/U PER CHECKOUT ON 06/18/09/TG   Visit Type:  Follow-up Primary [place]:  Dr. Shaune Pollack  CC:  no cardiology complaints.  History of Present Illness: [name] is a pleasant 77 y/o CM we are following for annual evaluation of permanent atrial fibrillation, hypercholesterolemia.  He is without cardiac complaint at this time.  He is followed in our coumadin clinic in [place].  He states shortly after seeing Dr. Dietrich Pates one year ago, he was hospitalized for a GI bleed requiring GI eval. The source of the bleeding was not found, but his wife states that he may have ruptured a blood vessel.  He has had no new allergies or diagnosis since last being seen.  Current Medications (verified): 1)  Metoprolol Tartrate 50 Mg Tabs (Metoprolol Tartrate) .... Take 1 Tab Two Times A Day 2)  Theophylline Cr 100 Mg Xr12h-Tab (Theophylline) .... Take 2 Tabs Two Times A Day 3)  Daily Multiple Vitamins  Tabs (Multiple Vitamin) .... Take 1 Tab Daily 4)  Lisinopril 40 Mg Tabs (Lisinopril) .... Take 1 Tab Daily 5)  Potassium Chloride Cr 10 Meq Cr-Caps (Potassium Chloride) .... Take 1 Tab Daily 6)  Simvastatin 40 Mg Tabs (Simvastatin) .... Take 1 Tab Daily 7)  Prilosec 20 Mg Cpdr (Omeprazole) .... Take 1 Tab Two Times A Day 8)  Ocuvite Preservision  Tabs (Multiple Vitamins-Minerals) .... Take 1 Tab Daily 9)  Doxazosin Mesylate 8 Mg Tabs (Doxazosin Mesylate) .... Take 1/2 Tab Daily 10)  Furosemide 40 Mg Tabs (Furosemide) .... Take 1/2 Tablet By Mouth Daily 11)  Coumadin 5 Mg Tabs (Warfarin Sodium) .... Take As Directed 12)  Slow Fe 160 (50 Fe) Mg Cr-Tabs (Ferrous Sulfate Dried) .... Take 1 Tab Daily 13)  Allopurinol 300 Mg Tabs (Allopurinol) .... Take 1 Tab Daily 14)  Polyethylene Glycol 8000  Powd (Polyethylene Glycol 8000) .... Take As Directed  Allergies: No Known Drug Allergies  Comments:  Nurse/Medical Assistant: no meds no list patient states all meds are correct from previous ov  [facility] is patients pharmacy  Past History:  Past medical, surgical, family and social histories (including risk factors) reviewed, and no changes noted (except as noted below).  Past Medical History: Reviewed history from 06/18/2009 and no changes required. ATRIAL FIBRILLATION, CHRONIC (ICD-427.31)since 2005; anticoagulation; pharmacologic stress nuclear negative in 4/05; ejection fraction of 66% ANEMIA (ICD-285.9)-H./H. of 10/30 CHRONIC KIDNEY DISEASE STAGE I (ICD-585.1) DYSLIPIDEMIA (ICD-272.4) HYPERTENSION (ICD-40 SCROTAL ABSCESS (ICD-608.4) History of pulmonary embolism-1991 GOUT, UNSPECIFIED (ICD-274.9) Gastroesophageal reflux disease-hiatal hernia Degenerative joint disease  Past Surgical History: Reviewed history from 06/18/2009 and no changes required. Renal stenosis repair-2009 ORIF for hip fracture-1996 cataract surgery appendectomy cholecystectomy  Family History: Reviewed history from 06/16/2009 and no changes required. Father:deceased age 20 natural causes Mother:deceased age 24 breast cancer Siblings:4 brothers deceased cause unknown  2 brothers alive   Social History: Reviewed history from 06/16/2009 and no changes required. Retired  Married  Tobacco Use - No.  Alcohol Use - no Regular Exercise - no Drug Use - no  Review of Systems       All other systems have been reviewed and are negative unless stated above.   Vital Signs:  Patient profile:   77 year old male Weight:      206 pounds BMI:     34.40 O2 Sat:      92 % on Room air Pulse rate:   77 / minute BP sitting:   135 / 78  (right arm)  Vitals Entered By: James Saa, CNA (June 08, 2010 1:11 PM)  O2 Flow:  Room air  Physical Exam  General:  normal appearance and healthy appearing.   Head:  normocephalic and atraumatic Eyes:  PERRLA/EOM intact; conjunctiva and lids normal. Lungs:  Clear bilaterally to auscultation and percussion. Heart:  Irregular with 1/6  systolic murmur without Rubs or Gallops. Abdomen:  Bowel sounds positive; abdomen soft and non-tender without masses, organomegaly, or hernias noted. No hepatosplenomegaly.Obese Msk:  Back normal, normal gait. Muscle strength and tone normal. Pulses:  pulses normal in all 4 extremities Extremities:  No clubbing or cyanosis. Neurologic:  Alert and oriented x 3. Psych:  Normal affect.   EKG  Procedure date:  06/08/2010  Findings:      Atrial fibrillation with a controlled ventricular response rate of: 70 bpm  Impression & Recommendations:  Problem # 1:  ATRIAL FIBRILLATION, CHRONIC (ICD-427.31) He is stable from a cardiac standpoint.  He is rate controlled and follows the coumadin regime.  He has had no further bleedling since Dec of 2010.  His updated medication list for this problem includes:    Metoprolol Tartrate 50 Mg Tabs (Metoprolol tartrate) .Marland Kitchen... Take 1 tab two times a day    Coumadin 5 Mg Tabs (Warfarin sodium) .Marland Kitchen... Take as directed  Problem # 2:  DYSLIPIDEMIA (ICD-272.4) He is followed by prmary care physician for this and will continue to do so.  No complaints of myalgias. His updated medication list for this problem includes:    Simvastatin 40 Mg Tabs (Simvastatin) .Marland Kitchen... Take 1 tab daily  Patient Instructions: 1)  Your physician recommends that you schedule a follow-up appointment in: 1 year 2)  Your physician recommends that you continue on your current medications as directed. Please refer to the Current Medication list given to you today.  Appended Document: 1 YR F/U PER CHECKOUT ON 06/18/09/TG  Reviewed Juanito Doom, MD

## 2010-08-03 ENCOUNTER — Encounter: Payer: Self-pay | Admitting: Cardiology

## 2010-08-03 ENCOUNTER — Encounter (INDEPENDENT_AMBULATORY_CARE_PROVIDER_SITE_OTHER): Payer: BC Managed Care – PPO

## 2010-08-03 DIAGNOSIS — Z7901 Long term (current) use of anticoagulants: Secondary | ICD-10-CM

## 2010-08-03 DIAGNOSIS — I4891 Unspecified atrial fibrillation: Secondary | ICD-10-CM

## 2010-08-03 LAB — CONVERTED CEMR LAB: POC INR: 2.4

## 2010-08-13 NOTE — Medication Information (Signed)
Summary: ccr-lr [place]  Anticoagulant Therapy Managed by: Vashti Hey, RN Patient Assessment Part 2:  Have you MISSED ANY DOSES or CHANGED TABLETS?  0  Have you had any BRUISING or BLEEDING ( nose or gum bleeds,blood in urine or stool)?  Have you STARTED or STOPPED any MEDICATIONS, including OTC meds,herbals or supplements?  Have you CHANGED your DIET, especially green vegetables,or ALCOHOL intake?  Have you had any ILLNESSES or HOSPITALIZATIONS?  Have you had any signs of CLOTTING?(chest discomfort,dizziness,shortness of breath,arms tingling,slurred speech,swelling or redness in leg)       Regimen Out:    Total Weekly: 20 mg mg  Next INR Due: 09/02/2010      Allergies: No Known Drug Allergies  Anticoagulant Therapy  Managed by: Vashti Hey, RN PCP: Dr. Shaune Pollack Supervising MD: Dietrich Pates MD, Molly Maduro Indication 1: Atrial Fibrillation (ICD-427.31) Indication 2: Pulmonary Embolism and Infarction (ICD-415.1) Lab Used: [facility] [place] Site: [place] INR POC 2.4 INR RANGE 2.0-3.0  Dietary changes: no    Health status changes: no    Bleeding/hemorrhagic complications: no    Recent/future hospitalizations: no    Any changes in medication regimen? no    Recent/future dental: no  Any missed doses?: no       Is patient compliant with meds? yes         Anticoagulation Management History:      The patient is taking warfarin and comes in today for a routine follow up visit.  Positive risk factors for bleeding include an age of 75 years or older.  The bleeding index is 'intermediate risk'.  Positive CHADS2 values include History of HTN and Age > 70 years old.  The start date was 10/21/2003.  Anticoagulation responsible provider: Dietrich Pates MD, Molly Maduro.  INR POC: 2.4.  Cuvette Lot#: [id number].  Exp: 01/2011.    Anticoagulation Management Assessment/Plan:      The patient's current anticoagulation dose is Coumadin 5 mg tabs: TAKE AS DIRECTED.   The target INR is 2 - 3.  The next INR is due 09/02/2010.  Anticoagulation instructions were given to patient.  Results were reviewed/authorized by Vashti Hey, RN.  He was notified by Vashti Hey RN.         Prior Anticoagulation Instructions: INR 2.3 Continue coumadin 2.5mg  once daily except 5mg  on Mondays  Current Anticoagulation Instructions: INR 2.4 Continue coumadin 2.5mg  once daily except 5mg  on Mondays

## 2010-09-02 ENCOUNTER — Encounter: Payer: Self-pay | Admitting: Cardiovascular Disease

## 2010-09-02 ENCOUNTER — Encounter (INDEPENDENT_AMBULATORY_CARE_PROVIDER_SITE_OTHER): Payer: BC Managed Care – PPO

## 2010-09-02 DIAGNOSIS — I4891 Unspecified atrial fibrillation: Secondary | ICD-10-CM

## 2010-09-02 DIAGNOSIS — Z7901 Long term (current) use of anticoagulants: Secondary | ICD-10-CM

## 2010-09-02 LAB — CONVERTED CEMR LAB: POC INR: 2.9

## 2010-09-08 NOTE — Medication Information (Signed)
Summary: ccr-lr  Anticoagulant Therapy  Managed by: Vashti Hey, RN PCP: Dr. Shaune Pollack Supervising MD: Eden Emms MD, Theron Arista Indication 1: Atrial Fibrillation (ICD-427.31) Indication 2: Pulmonary Embolism and Infarction (ICD-415.1) Lab Used: [facility] [place] Site: [place] INR POC 2.9 INR RANGE 2.0-3.0  Dietary changes: no    Health status changes: no    Bleeding/hemorrhagic complications: no    Recent/future hospitalizations: no    Any changes in medication regimen? no    Recent/future dental: no  Any missed doses?: no       Is patient compliant with meds? yes       Allergies: No Known Drug Allergies  Anticoagulation Management History:      The patient is taking warfarin and comes in today for a routine follow up visit.  Positive risk factors for bleeding include an age of 75 years or older.  The bleeding index is 'intermediate risk'.  Positive CHADS2 values include History of HTN and Age > 75 years old.  The start date was 10/21/2003.  Anticoagulation responsible provider: Eden Emms MD, Theron Arista.  INR POC: 2.9.  Exp: 01/2011.    Anticoagulation Management Assessment/Plan:      The patient's current anticoagulation dose is Coumadin 5 mg tabs: TAKE AS DIRECTED.  The target INR is 2 - 3.  The next INR is due 09/30/2010.  Anticoagulation instructions were given to patient.  Results were reviewed/authorized by Vashti Hey, RN.  He was notified by Vashti Hey RN.         Prior Anticoagulation Instructions: INR 2.4 Continue coumadin 2.5mg  once daily except 5mg  on Mondays  Current Anticoagulation Instructions: INR 2.9 Continue coumadin 2.5mg  once daily except 5mg  on Mondays

## 2010-09-29 ENCOUNTER — Encounter: Payer: Self-pay | Admitting: Cardiology

## 2010-09-29 DIAGNOSIS — Z7901 Long term (current) use of anticoagulants: Secondary | ICD-10-CM

## 2010-09-29 DIAGNOSIS — I4891 Unspecified atrial fibrillation: Secondary | ICD-10-CM

## 2010-09-30 ENCOUNTER — Ambulatory Visit (INDEPENDENT_AMBULATORY_CARE_PROVIDER_SITE_OTHER): Payer: BC Managed Care – PPO | Admitting: *Deleted

## 2010-09-30 DIAGNOSIS — I4891 Unspecified atrial fibrillation: Secondary | ICD-10-CM

## 2010-09-30 DIAGNOSIS — Z7901 Long term (current) use of anticoagulants: Secondary | ICD-10-CM

## 2010-10-12 LAB — CBC
HCT: 23.9 % — ABNORMAL LOW (ref 39.0–52.0)
HCT: 27.4 % — ABNORMAL LOW (ref 39.0–52.0)
HCT: 29.7 % — ABNORMAL LOW (ref 39.0–52.0)
Hemoglobin: 9.6 g/dL — ABNORMAL LOW (ref 13.0–17.0)
Hemoglobin: 9.8 g/dL — ABNORMAL LOW (ref 13.0–17.0)
MCHC: 32.9 g/dL (ref 30.0–36.0)
MCHC: 34.3 g/dL (ref 30.0–36.0)
MCV: 93.6 fL (ref 78.0–100.0)
MCV: 94.6 fL (ref 78.0–100.0)
MCV: 95 fL (ref 78.0–100.0)
Platelets: 120 10*3/uL — ABNORMAL LOW (ref 150–400)
Platelets: 123 10*3/uL — ABNORMAL LOW (ref 150–400)
Platelets: 124 10*3/uL — ABNORMAL LOW (ref 150–400)
RBC: 3.02 MIL/uL — ABNORMAL LOW (ref 4.22–5.81)
RBC: 3.07 MIL/uL — ABNORMAL LOW (ref 4.22–5.81)
RDW: 20.5 % — ABNORMAL HIGH (ref 11.5–15.5)
WBC: 7.6 10*3/uL (ref 4.0–10.5)
WBC: 9.6 10*3/uL (ref 4.0–10.5)
WBC: 9.6 10*3/uL (ref 4.0–10.5)

## 2010-10-12 LAB — BASIC METABOLIC PANEL
BUN: 14 mg/dL (ref 6–23)
BUN: 20 mg/dL (ref 6–23)
BUN: 28 mg/dL — ABNORMAL HIGH (ref 6–23)
CO2: 24 mEq/L (ref 19–32)
CO2: 26 mEq/L (ref 19–32)
Calcium: 8.7 mg/dL (ref 8.4–10.5)
Chloride: 107 mEq/L (ref 96–112)
Creatinine, Ser: 1.28 mg/dL (ref 0.4–1.5)
GFR calc Af Amer: >60 mL/min (ref >60)
GFR calc non Af Amer: 46 mL/min — ABNORMAL LOW (ref >60)
GFR calc non Af Amer: 54 mL/min — ABNORMAL LOW (ref >60)
Glucose, Bld: 101 mg/dL — ABNORMAL HIGH (ref 70–99)
Glucose, Bld: 135 mg/dL — ABNORMAL HIGH (ref 70–99)
Potassium: 3.8 mEq/L (ref 3.5–5.1)
Potassium: 3.9 mEq/L (ref 3.5–5.1)

## 2010-10-12 LAB — HEMOGLOBIN AND HEMATOCRIT, BLOOD
HCT: 28.8 % — ABNORMAL LOW (ref 39.0–52.0)
Hemoglobin: 8.6 g/dL — ABNORMAL LOW (ref 13.0–17.0)
Hemoglobin: 9.4 g/dL — ABNORMAL LOW (ref 13.0–17.0)

## 2010-10-12 LAB — DIFFERENTIAL
Basophils Absolute: 0 10*3/uL (ref 0.0–0.1)
Basophils Absolute: 0 10*3/uL (ref 0.0–0.1)
Basophils Relative: 0 % (ref 0–1)
Eosinophils Absolute: 0 10*3/uL (ref 0.0–0.7)
Eosinophils Relative: 0 % (ref 0–5)
Eosinophils Relative: 1 % (ref 0–5)
Lymphocytes Relative: 11 % — ABNORMAL LOW (ref 12–46)
Lymphocytes Relative: 17 % (ref 12–46)
Lymphs Abs: 1.1 10*3/uL (ref 0.7–4.0)
Lymphs Abs: 1.1 10*3/uL (ref 0.7–4.0)
Lymphs Abs: 1.3 10*3/uL (ref 0.7–4.0)
Monocytes Absolute: 1.1 10*3/uL — ABNORMAL HIGH (ref 0.1–1.0)
Monocytes Absolute: 1.3 10*3/uL — ABNORMAL HIGH (ref 0.1–1.0)
Monocytes Relative: 10 % (ref 3–12)
Monocytes Relative: 12 % (ref 3–12)
Monocytes Relative: 14 % — ABNORMAL HIGH (ref 3–12)
Neutro Abs: 7.4 10*3/uL (ref 1.7–7.7)
Neutrophils Relative %: 69 % (ref 43–77)

## 2010-10-12 LAB — URINALYSIS, ROUTINE W REFLEX MICROSCOPIC
Glucose, UA: NEGATIVE mg/dL
Ketones, ur: NEGATIVE mg/dL
Protein, ur: NEGATIVE mg/dL

## 2010-10-12 LAB — PROTIME-INR
INR: 1.7 — ABNORMAL HIGH (ref 0.00–1.49)
Prothrombin Time: 20.6 seconds — ABNORMAL HIGH (ref 11.6–15.2)

## 2010-10-28 ENCOUNTER — Ambulatory Visit (INDEPENDENT_AMBULATORY_CARE_PROVIDER_SITE_OTHER): Payer: BC Managed Care – PPO | Admitting: *Deleted

## 2010-10-28 DIAGNOSIS — I4891 Unspecified atrial fibrillation: Secondary | ICD-10-CM

## 2010-10-28 DIAGNOSIS — Z7901 Long term (current) use of anticoagulants: Secondary | ICD-10-CM

## 2010-11-10 NOTE — Op Note (Signed)
NAME:  James [name], VOILES NO.:  [id number]   MEDICAL RECORD NO.:  [id number]          PATIENT TYPE:  INP   LOCATION:  A313                          FACILITY:  [facility]   PHYSICIAN:  R. Roetta Sessions, M.D. DATE OF BIRTH:  [birth year]   DATE OF PROCEDURE:  DATE OF DISCHARGE:                               OPERATIVE REPORT   INDICATIONS FOR PROCEDURE:  An 77 year old gentleman in the hospital  with acute on chronic anemia with black stool and blood per rectum in  the setting of ongoing diclofenac and Coumadin therapy.  He came in  coagulopathic at 3.7.  Over the past 4 days, he received 4 units of  packed red blood cells, and FFP. INR is down to 9.5.  Hemoglobin has  been stable in 9 range.  Last colonoscopy was slightly over 4-1/2 years  ago and found to have diverticulosis.  EEG has now been done.  Risks,  benefits, alternatives, and limitations have been reviewed and questions  were answered.  He is agreeable.  Please see the documentation in the  medical record.   PROCEDURE NOTE:  O2 saturation, blood pressure, pulse, and respirations  were monitored in the entire procedure.   CONSCIOUS SEDATION:  Versed 3 mg IV and Demerol 75 mg IV in divided  doses.   INSTRUMENT:  Pentax video chip system.  EGD.  Cetacaine spray for  topical pharyngeal anesthesia.   FINDINGS:  Examination of the tubular esophagus revealed noncritical  Schatzki ring, otherwise normal esophagus.  Normal EGD junction is  easily traversed.  Stomach:  Gastric cavity was emptied and insufflated  well with air.  Thorough examination of the gastric mucosa including the  retroflexed proximal stomach esophagogastric junction demonstrated only  a small hiatal hernia.  Pylorus was patent and easily traversed.  Examination of the bulb and second portion revealed no abnormalities.  Therapeutic/diagnostic maneuvers performed:  None.   The patient tolerated the procedure well and was reactive to Endoscopy.   IMPRESSION:  Noncritical Schatzki ring, not manipulated, normal  esophagus, small hiatal hernia, normal stomach, D1, D2.   RECOMMENDATIONS:  Since it has been the better part of 5 years since he  last had his lower GI tract image, we are going to offer him a  colonoscopy on July 02, 2008.  Risks, benefits, alternatives and  limitations have been reviewed.  Mr. Star understands Dr. Clarene Duke will  be performing the procedure.  If this study is unrevealing, we will  consider imaging of the small intestine.  Further recommendations to  follow.      Jonathon Bellows, M.D.  Electronically Signed     RMR/MEDQ  D:  07/01/2008  T:  07/02/2008  Job:  [id number]   cc:   Ramon Dredge L. Juanetta Gosling, M.D.  Fax: [phone]

## 2010-11-10 NOTE — Discharge Summary (Signed)
NAME:  James [name], James [name] NO.:  [id number]   MEDICAL RECORD NO.:  [id number]          PATIENT TYPE:  INP   LOCATION:  A313                          FACILITY:  [facility]   PHYSICIAN:  Edward L. Juanetta Gosling, M.D.DATE OF BIRTH:  1921/11/11   DATE OF ADMISSION:  06/27/2008  DATE OF DISCHARGE:  01/05/2010LH                               DISCHARGE SUMMARY   FINAL DISCHARGE DIAGNOSES:  1. Acute gastrointestinal bleeding, source unknown.  2. Anemia secondary to acute gastrointestinal bleeding.  3. Hypertension.  4. Severe osteoarthritis.  5. Atrial fibrillation.  6. Chronic Coumadin therapy.  7. Chronic obstructive pulmonary disease.  8. Gout.  9. Gastroesophageal reflux disease.  10.Hyperlipidemia.  11.Over-anticoagulation.   HISTORY:  James [name] is a 77 year old with a long known history of  severe arthritis, hypertension and atrial fibrillation who came to the  emergency room because of bleeding.  He had been on chronic Coumadin  therapy because of his atrial fibrillation.  He was noted to be anemic  and because of the amount of blood that he seemed to have, he was  admitted.  He has been on long-term Arthrotec for arthritis.  He has  been getting that from the [facility].  He has not had any abdominal  pain.  He has not had any other symptoms.  He has had no chest pain or  any other symptoms associated with his bleeding.   PHYSICAL EXAMINATION:  GENERAL:  On admission shows that he was  hemodynamically stable.  ABDOMEN:  Soft without masses.  His stool was grossly heme positive.  CHEST:  Fairly clear.  HEART:  Irregular with chronic atrial fibrillation.   HOSPITAL COURSE:  He was given IV fluids.  He was transfused and he  eventually got a total of 4 units of packed red blood cells.  His INR  was elevated at 3.4.  He received vitamin K and because he was bleeding,  he received fresh frozen plasma.  He stabilized over the next several  days.  Consultation was obtained  with the GI team and he underwent EGD  and colonoscopy neither of which showed a definite cause of his  bleeding.  It is felt that this may be related to his diclofenac, so he  is going to discontinue that.  He is going to be on iron over-the-  counter 1 daily.  His Prilosec, which has been 20 mg daily is going to  be 20 mg b.i.d. and he is to have a capsule study to see if another  source of bleeding could be identified since we did not identify  definite source during this admission.  He will be restarted on Coumadin  after we get a better idea of his bleeding source and after we are sure  that his hemoglobin is stable.  I discussed his situation after the  colonoscopy with Dr. Kassie Mends and she felt that he could be  discharged safely.  He is discharged home on potassium chloride 750 mg  daily, Altace it is listed as 40 mg, but I  believe the dose is  actually 10 mg daily, theophylline 200 mg b.i.d.,  Lopressor 50 mg b.i.d., Lasix 40 mg daily, allopurinol 300 mg daily,  Prilosec 20 mg b.i.d., Cardura 2 mg at bedtime, simvastatin 40 mg daily  and he is going to remain on his vitamin.  I will see back in my office  in several days.  He is going to follow with Dr. Cira Servant as well.      Edward L. Juanetta Gosling, M.D.  Electronically Signed     ELH/MEDQ  D:  07/02/2008  T:  07/03/2008  Job:  [id number]

## 2010-11-10 NOTE — Consult Note (Signed)
NAME:  James [name], James [name] NO.:  [id number]   MEDICAL RECORD NO.:  [id number]          PATIENT TYPE:  AMB   LOCATION:  DAY                           FACILITY:  [facility]   PHYSICIAN:  Noralyn Pick. Eden Emms, MD, FACCDATE OF BIRTH:  05/03/1922   DATE OF CONSULTATION:  08/23/2007  DATE OF DISCHARGE:                                 CONSULTATION   James [name] is an 77 year old patient we are asked to see for Dr. Malvin Johns  regarding atrial fibrillation and Coumadin therapy.   The patient has had problems with his bowel movements. He has had to use  suppositories chronically over the last month or two to have a bowel  movement. At one point, he could barely fit the suppository in.  He is  status post rectal surgery for anal stenosis.   The patient's Coumadin has been held prior to surgery.  The patient has  permanent atrial fibrillation.  He has had been on Coumadin for quite  some time.   There is also a history of DVT with recurrent pulmonary emboli in 1991  and 1996.  However, he has not had any complications being off his  Coumadin and has not had to be overlapped on Lovenox or heparin in the  past.   The patient currently is stable postop.  His intraoperative course has  been benign.  here is no hypotension, no chest pain, no palpitations.  Postoperatively, he has some anal pain but is not having any significant  shortness of breath, palpitations, PND, orthopnea.   He is resting comfortably in the PACU when I saw him.   REVIEW OF SYSTEMS:  Otherwise negative.   MEDICATIONS:  1. Potassium 20 a day.  2. Lisinopril 40 a day.  3. Theophylline 200 b.i.d.  4. Misoprostol 200 three times a day.  5. Diflucan 75 three times a day  6. Metoprolol 50 b.i.d.  7. Lasix 40 a day.  8. Allopurinol 300 a day,  9. Omeprazole 20 a day.  10.Doxazosin 4 mg a day.  11.Coumadin 2.5 a day and 5 on Friday.   ALLERGIES:  OXYCODONE and SUPRAX.   PAST MEDICAL HISTORY:  1. Atrial fibrillation  on Coumadin therapy.  2. Nonischemic Myoview in 2005.  He has good LV function with an EF of      50-55% by previous echocardiogram.  He has moderate MR.  3. COPD.  4. Mild renal insufficiency.  5. History of DVT with PE.  6. Osteoarthritis.  7. He is status post left total knee replacement.  8. He has hypertension.  9. Hiatal hernia.  10.Gout mild.  11.Mild chronic lower extremity edema.  12.History of hemorrhoids.   PAST SURGICAL HISTORY:  1. Appendectomy.  2. Cholecystectomy.  3. Previous back surgery.  4. Circumcision in 2005.   SOCIAL HISTORY:  The patient lives in [place] with his wife.  He has  four children, two daughters and a son who live with an hollering  distance. 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   He does not smoke or drink.  He is quite active in regards to riding a tractor and doing his garden.   FAMILY HISTORY:  Noncontributory.   PHYSICAL EXAMINATION:  GENERAL:  Remarkable for an elderly white male in  no distress recovering in the PACU.  VITAL SIGNS:  O2 saturation 100% on 2 liters.  He is in atrial  fibrillation at a rate of 70, respiratory rate 16, blood pressure  100/50, afebrile.  HEENT:  Unremarkable.  NECK:  Carotids normal upstroke without bruit.  No lymphadenopathy,  thyromegaly, JVP elevation.  LUNGS:  Clear.  Good diaphragmatic motion and no wheezing.  CARDIAC:  S1-S2 with an MR murmur at the apex.  PMI normal.  ABDOMEN:  Benign. Bowel sounds positive.  No AAA.  No hepatosplenomegaly  or hepatojugular reflux.  EXTREMITIES:  Distal pulses intact with no edema.  NEUROLOGIC:  Nonfocal.  SKIN:  Warm and dry.  MUSCULOSKELETAL:  No muscular weakness.  She is status post left total  knee replacement.  RECTAL:  Status post anal surgery.   His labs are currently remarkable for a hematocrit of 34.  BUN 39,  creatinine 1.8. INR 1.5.   IMPRESSION:  1. Atrial fibrillation, rate controlled.  Check EKG.  Continue current       medications. Lovenox to be started 12 hours after surgery. First      Coumadin dose to be given tomorrow.  2. Prerenal azotemia.  Hydrate the patient. This will hopefully take      care of his mild hyperkalemia as well.  There is a history of mild      renal insufficiency.  We will hold his potassium and lisinopril for      now and recheck his electrolytes in the morning.  3. Hypertension, currently well controlled, mildly hypotensive postop      likely due to anesthesia. Check hemoglobin and hematocrit in the      morning.  4. History of chronic obstructive pulmonary disease.  Saturation is      100% on 2 liters, currently breathing comfortably.  Check chest x-      ray in the morning, particularly given his history of previous      pulmonary embolus.   Overall, the patient appears stable postoperatively.  He will have  Lovenox started in 12 hours and Coumadin started in the morning.      Noralyn Pick. Eden Emms, MD, [facility]  Electronically Signed     PCN/MEDQ  D:  08/23/2007  T:  08/23/2007  Job:  [id number]

## 2010-11-10 NOTE — Group Therapy Note (Signed)
NAME:  James [name], James [name] NO.:  [id number]   MEDICAL RECORD NO.:  [id number]          PATIENT TYPE:  INP   LOCATION:  A313                          FACILITY:  [facility]   PHYSICIAN:  Edward L. Juanetta Gosling, M.D.DATE OF BIRTH:  05-Jul-1921   DATE OF PROCEDURE:  DATE OF DISCHARGE:                                 PROGRESS NOTE   James [name] is admitted with GI bleeding.  He seems more stable.  Although, he says he still had some dark stool.  This morning, his heart  rate has gone up, and yesterday I putting back on his metoprolol, but  not at full dose, because of his concern about lowering his blood  pressure.  His pulse rate and blood pressure are up now, so I think we  can go back to full dose.  I am also going to give him some Lasix,  because he has had significant amount of IV fluids.   PHYSICAL EXAMINATION:  VITAL SIGNS:  Today shows temperature is 98.2,  pulse 122, respirations 24, blood pressure 158/94, and O2 sats 97% on 3  L.  CHEST:  Relatively clear.  HEART:  Irregular.  He is in chronic atrial fibrillation, but he has  more rapid response now.  ABDOMEN:  Actually very soft and he looks comfortable.   His prothrombin time is 20.6 with an INR of 1.7.  I am still not ready  to get him back on Coumadin.  BMET shows his BUN is 20, creatinine is  1.28, and potassium is 3.8.  His CBC shows hemoglobin is 9.6.   ASSESSMENT:  He has more rapid atrial fibrillation, so I am going to  have him increase his metoprolol back to 50 mg b.i.d.  I am also going  to give him some Lasix since he has been getting substantial amounts of  intravenous fluids.  He is going to have a hemoglobin/hematocrit of  about 1600 and if his hemoglobin is less than 9, he will need another  unit of packed red blood cells.  Otherwise, plan to continue with his  treatments as before and he will have a Gastrointestinal consult on  July 02, 2007.      Edward L. Juanetta Gosling, M.D.  Electronically  Signed     ELH/MEDQ  D:  06/29/2008  T:  06/29/2008  Job:  [id number]

## 2010-11-10 NOTE — Group Therapy Note (Signed)
NAME:  James [name], MAFFEI                 ACCOUNT NO.:  [id number]   MEDICAL RECORD NO.:  [id number]          PATIENT TYPE:  OIB   LOCATION:  A314                          FACILITY:  [facility]   PHYSICIAN:  Edward L. Juanetta Gosling, M.D.DATE OF BIRTH:  01/09/1922   DATE OF PROCEDURE:  08/24/2007  DATE OF DISCHARGE:  08/24/2007                                 PROGRESS NOTE   Patient of Dr. Malvin Johns.   Mr. Molstad is better and says he wants to go home.  He has no new  complaints.   PHYSICAL EXAM:  Shows temperature is 97.9, pulse 88, respirations 20,  blood pressure 130/73, O2 sat 100% on room air.  His chest shows some rhonchi more on the right than on the left.  His heart is regular.  Abdomen is soft and he looks pretty comfortable.   Electrolytes were normal.  H&H 10.5 and 31.2 respectively.   ASSESSMENT:  I think he is doing well.   PLAN:  He may be able to go home.      Edward L. Juanetta Gosling, M.D.  Electronically Signed     ELH/MEDQ  D:  08/24/2007  T:  08/24/2007  Job:  [id number]

## 2010-11-10 NOTE — Letter (Signed)
July 26, 2008    Edward L. Juanetta Gosling, MD  [address]  [place], [place] [address]   RE:  James, [name]  MRN:  [id number]  /  DOB:  [birth year]   Dear Ed:   James [name] returns to the office early after recent admission to [facility] for substantial GI bleed requiring transfusion of  approximately 6 units of packed red blood cells.  He was found to have a  Schatzki ring, rectal fissure, and diverticular disease, but the source  of his major GI bleeding was not definitively identified.  He had  previously been treated with nonsteroidals, which were discontinued.  Coumadin was held for a week or two and then resume.  Since that time,  he has noted no melena and no bright red blood per rectum.   CURRENT MEDICATIONS:  Unchanged from his last visit except for  discontinuation of diclofenac.  He had been taking misoprostol, which  was also discontinued.  He continues on omeprazole.   PHYSICAL EXAMINATION:  GENERAL:  Pleasant older gentleman in no acute  distress.  VITAL SIGNS:  The weight is 189, 5 pounds less than in June.  Blood  pressure 125/70, heart rate 78 and somewhat irregular, respirations 12  and unlabored.  NECK:  No jugular venous distention; no carotid bruits.  LUNGS:  Clear.  CARDIAC:  Normal first and second heart sounds.  ABDOMEN:  Soft and nontender; no masses; no organomegaly.  EXTREMITIES:  Trace edema.   INR - 2.3.   IMPRESSION:  James [name] has had a life-threatening gastrointestinal  bleed, but has significant risk for thromboembolism related to chronic  atrial fibrillation.  He will require close monitoring of  anticoagulation and maintenance of an INR less than 3.  We will check a  CBC now and in 6 weeks as well as stool for Hemoccult testing.  I will  reassess this nice gentleman in 3 months.    Sincerely,      Gerrit Friends. Dietrich Pates, MD, [facility]  Electronically Signed    RMR/MedQ  DD: 07/26/2008  DT: 07/27/2008  Job #: [id number]   CC:    Kassie Mends, M.D.

## 2010-11-10 NOTE — Consult Note (Signed)
NAME:  James [name], James [name] NO.:  [id number]   MEDICAL RECORD NO.:  [id number]          PATIENT TYPE:  EMS   LOCATION:  ED                            FACILITY:  [facility]   PHYSICIAN:  Barbaraann Barthel, M.D. DATE OF BIRTH:  07-15-1921   DATE OF CONSULTATION:  08/28/2007  DATE OF DISCHARGE:                                 CONSULTATION   Surgery was asked to see this 77 year old white male who is  approximately a week or so status post left lateral sphincterotomy for  anal stenosis.  He came to the emergency room with a blood clot near his  anal area.  This patient is on Coumadin and was treated perioperatively  with Lovenox.  His clotting studies show an INR 1.8 and a PTT of 21.3.  He is doing no active bleeding at the present time.  His wound was  inspected.  The clot was evacuated.  There is no active bleeding.  I  have discussed with the family that he needs to use continuous warm  soaks as needed and dress this wound with Xeroform or Vaseline gauze and  ABD pad or Kotex dressing.  He needs to continue to follow with Dr.  Dietrich Pates regarding his clotting studies which seem adequate at the  present time and continue his stool softeners.  SITZ baths are to be  p.r.n. and after each bowel movement.  This was all explained to his  wife.  I will see again as needed perioperatively and we have already  made arrangements with the family to see him after surgery.  I urged the  patient to check in with Dr. Dietrich Pates today prior to his being  discharged from the emergency room to see if there are any instructions  from his point of view.      Barbaraann Barthel, M.D.  Electronically Signed     WB/MEDQ  D:  08/28/2007  T:  08/28/2007  Job:  [id number]   cc:   Ramon Dredge L. Juanetta Gosling, M.D.  Fax: [phone]   Gerrit Friends. Dietrich Pates, MD, [facility]  [address]  [place], [place] [address]

## 2010-11-10 NOTE — Op Note (Signed)
NAME:  James, [name] NO.:  [id number]   MEDICAL RECORD NO.:  [id number]          PATIENT TYPE:  INP   LOCATION:  A313                          FACILITY:  [facility]   PHYSICIAN:  Kassie Mends, M.D.      DATE OF BIRTH:  1921-10-03   DATE OF PROCEDURE:  07/02/2008  DATE OF DISCHARGE:                               OPERATIVE REPORT   REFERRING PHYSICIAN:  Edward L. Juanetta Gosling, M.D.   PROCEDURE:  Ileocolonoscopy.   INDICATION FOR EXAM:  [name] is an 77 year old male who presented  with black tarry stools and bright red blood per rectum.  He was on  diclofenac, Coumadin, and Prilosec.  He had an upper endoscopy yesterday  which revealed no source for his black tarry stools or his bright red  blood per rectum.  His initial hemoglobin was 7.7 and has been  9.2-9.8  since being transfused.  His INR is now normal.   FINDINGS:  1. Frequent sigmoid colon and descending colon diverticula.  No old      blood or fresh blood seen in the colon.  No polyps, masses,      inflammatory changes or AVMs seen.  2. Normal distal terminal ileum, approximately 10-20 cm visualized.      No old blood or fresh blood seen in the distal terminal ileum.  3. Small internal hemorrhoids.  Prominent anal papillae.  4. Anal stenosis with two anal fissures.   DIAGNOSES:  1. No source for melena identified.  2. Bright red blood per rectum, likely secondary to anal fissures and      hemorrhoids while on Coumadin with a supratherapeutic INR.   RECOMMENDATIONS:  1. Stop the diclofenac.  Hold his Coumadin until his GI evaluation is      complete.  Consider capsule endoscopy on Wednesday or Thursday of      this week.  2. Advance his diet.  3. Daily PPI.   MEDICATIONS:  1. Demerol 75 mg IV.  2. Versed 4 mg IV.   PROCEDURE TECHNIQUE:  Physical exam was performed.  Informed consent was  obtained from the patient after explaining the benefits, risks and  alternatives to the procedure.  The patient was  connected to the monitor  and placed in the left lateral position.  Continuous oxygen was provided  by nasal cannula.  The IV medicine administered through an indwelling  cannula.  After administration of sedation and rectal exam, the  patient's rectum was intubated  and the scope was advanced under direct visualization to the distal  terminal ileum.  The scope was removed slowly by carefully examining the  color, texture, anatomy and integrity of the mucosa on the way out.  The  patient was recovered in endoscopy and discharged to the floor in  satisfactory condition.      Kassie Mends, M.D.  Electronically Signed     SM/MEDQ  D:  07/02/2008  T:  07/02/2008  Job:  [id number]   cc:   Ramon Dredge L. Juanetta Gosling, M.D.  Fax: [phone]

## 2010-11-10 NOTE — Group Therapy Note (Signed)
NAME:  James [name], BAMBER NO.:  [id number]   MEDICAL RECORD NO.:  [id number]          PATIENT TYPE:  INP   LOCATION:  A313                          FACILITY:  [facility]   PHYSICIAN:  Edward L. Juanetta Gosling, M.D.DATE OF BIRTH:  10-01-1921   DATE OF PROCEDURE:  DATE OF DISCHARGE:                                 PROGRESS NOTE   Ms. Dissinger has not had another bowel movement since last night and had no  further bleeding.  He appears to be more stable.  He received 2 units of  packed red blood cells.  He inadvertently received a regular breakfast  this morning rather than liquids.   PHYSICAL EXAMINATION:  VITAL SIGNS:  Now shows his temperature is 97.9,  pulse 101, respirations 16, blood pressure 150/85, and O2 sats 94% on  room air.  CHEST:  Clear without wheezes.  HEART:  Regular.  ABDOMEN:  Very soft.   ASSESSMENT:  His gastrointestinal bleeding may be on the basis of  Voltaren.  He does have severe osteoarthritis.  He has atrial  fibrillation.  I am going to cautiously restart some of his medications  to control his atrial fibrillation.  I think, his gastrointestinal  bleeding probably has stopped.  His last hemoglobin and hematocrit 8.6  and 26 respectively and it was 8.2 earlier.  His renal function looks  better.  I suspect some of that was from his blood in the gut.  Assessment then, he has gastrointestinal bleeding, which I think is  stopped.  He has anemia from that.  I am going to give one more unit of  packed red blood cells.  He has atrial fibrillation and he is responsive  coming up a little bit, so I am going to re-add his Lopressor.  I do not  plan to change anything else at this point.  I may be able to advance  his diet tomorrow.  We will recheck his labs tomorrow.  Check his  hemoglobin 2 hours after the blood is given and if his hemoglobin level  is less than 9, I will probably give him one more unit at that point.  His PT/INR is still therapeutic, but I am  going to hold his Coumadin.      Edward L. Juanetta Gosling, M.D.  Electronically Signed     ELH/MEDQ  D:  06/28/2008  T:  06/28/2008  Job:  [id number]

## 2010-11-10 NOTE — Letter (Signed)
November 29, 2007    James [name], M.D.  [address]  [place], [place] [address]   RE:  James [name]  MRN:  [id number]  /  DOB:  03-02-1922   Dear Ed;   James [name] returns to the office as scheduled for continued assessment  treatment of chronic atrial fibrillation requiring anticoagulation.  Since I last saw him, he underwent surgery for repair of anal stenosis a  few months ago.  This proceeded without complications.  Otherwise he has  done generally well.  He reports excessive fatigue and impaired exercise  capacity.   CURRENT MEDICATIONS:  1. Omeprazole 20 mg daily.  2. Furosemide 40 mg daily.  3. Theophylline 100 mg b.i.d.  4. Allopurinol 300 mg daily.  5. Doxazosin 4 mg daily.  6. Warfarin as directed, with stable and therapeutic anticoagulation.  7. Simvastatin 40 mg daily.  8. Lisinopril 40 mg daily.  9. Metoprolol 50 mg b.i.d.  10.Kay Ciel 10 mEq daily.  11.Misoprostol 200 mcg t.i.d.  12.Diclofenac 75 mg b.i.d.  13.MiraLax 17 grams daily.   PHYSICAL EXAMINATION:  A very pleasant gentleman who is probably  somewhat hard of hearing and in no acute distress.  The weight is 194, 8  pounds less than last year.  Blood pressure 90/60, heart rate 66 and  irregular, respirations 15.  NECK:  No jugular venous distention; normal carotid upstrokes without  bruits.  LUNGS:  Clear.  CARDIAC:  Irregular rhythm; normal first and second heart sounds.  ABDOMEN:  Soft and nontender; no bruits; normal bowel sounds; no  organomegaly.  EXTREMITIES:  Trace edema; distal pulses intact.   INR -- 2.6.   Recent laboratories reveal mild chronic kidney disease with a creatinine  of 1.55 and a moderate anemia with hemoglobin of 10.2, hematocrit of  29.8 and a normal MCV of 98.   IMPRESSION:  James [name] is doing generally well.  His anemia may be  symptomatic.  We will check a TSH level as well as an anemia profile and  a repeat CBC.  I will send the results to you for further management  of  his anemia.  It could be related to renal insufficiency, but his  creatinine is fairly low to account for a hemoglobin at this level.  We  will check stool for hemoccult testing to rule out occult GI bleeding.  James [name] reports colonoscopy just 2-3 years ago, but has not had upper  endoscopy.  We will continue to manage warfarin dosing in the  anticoagulation clinic and plan a formal return visit in 1 year.    Sincerely,      Gerrit Friends. Dietrich Pates, MD, [facility]  Electronically Signed    RMR/MedQ  DD: 11/29/2007  DT: 11/29/2007  Job #: [id number]

## 2010-11-10 NOTE — Group Therapy Note (Signed)
NAME:  SALAH, NAKAMURA NO.:  [id number]   MEDICAL RECORD NO.:  [id number]          PATIENT TYPE:  INP   LOCATION:  A313                          FACILITY:  [facility]   PHYSICIAN:  Edward L. Juanetta Gosling, M.D.DATE OF BIRTH:  Aug 20, 1921   DATE OF PROCEDURE:  DATE OF DISCHARGE:                                 PROGRESS NOTE   Mr. Prats is I think a little better.  He is still complaining of being  weak.  He was concerned about a transfusion reaction yesterday, but he  appears not to have had a significant hemolytic transfusion reaction.   His exam this morning, he is awake and alert.  He looks more  comfortable.  His chest is clear.  Heart is still somewhat irregular.  Temperature is 98.7, pulse 101, respirations 16, and blood pressure  159/87.  He looks much more comfortable.  His abdomen is soft.  The rest  of his exam is he seems to be improving and he does not have any new  problems.  He has not had anymore blood per rectum by his history.   Lab work shows, white blood count 9700, hemoglobin is 9.8, and platelets  123.  His PT 18.6, with an INR of 1.5.      Edward L. Juanetta Gosling, M.D.  Electronically Signed     ELH/MEDQ  D:  07/01/2008  T:  07/01/2008  Job:  [id number]

## 2010-11-10 NOTE — Group Therapy Note (Signed)
NAME:  James [name], SWITZER NO.:  [id number]   MEDICAL RECORD NO.:  [id number]          PATIENT TYPE:  INP   LOCATION:  A313                          FAC[facility]:  [facility]   PHYSICIAN:  Edward L. Juanetta Gosling, M.D.DATE OF BIRTH:  1921/10/05   DATE OF PROCEDURE:  DATE OF DISCHARGE:                                 PROGRESS NOTE   Mr. Vasco is overall doing, I think, a little bit better.  He stated he  still does not feel as well as he would like.  He has no new complaints  except his throat is a little sore.   PHYSICAL EXAMINATION:  GENERAL:  He has what looks like some swelling in  the back of his throat.  He does not have any chest pain.  No more  episodes of elevated heart rate.  VITAL SIGNS:  Temperature is 98.7, pulse 93, respirations 18, blood  pressure 160/97, and O2 sats 96%.  THROAT:  His throat is slightly erythematous.  CHEST:  Pretty clear.   His hemoglobin dropped a bit to 9.2 and I am going to go ahead and give  him another unit of packed red blood cells.  I am going to hold him  n.p.o. except meds after midnight tonight and asked for GI consultation.  I suspect, his bleeding maybe the result of his anti-inflammatory  medications.  Then, we may need to start him back on Coumadin, but I am  going to wait until we see the results of his GI evaluation, before I  make any sort of decision about that.  He does state that he has had a  little bit of red blood in his stool today.      Edward L. Juanetta Gosling, M.D.  Electronically Signed     ELH/MEDQ  D:  06/30/2008  T:  06/30/2008  Job:  [id number]

## 2010-11-10 NOTE — Letter (Signed)
October 21, 2008    Edward L. Juanetta Gosling, M.D.  [address]  [place], [place] [address]   RE:  VIRGIL, LIGHTNER  MRN:  [id number]  /  DOB:  1922-01-01   Dear Ed:   Mr. Carandang returns to the office for continued assessment and treatment  of atrial fibrillation requiring anticoagulation and multiple additional  medical issues.  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  He has had pain with this, but  no significant pruritus.  He was just beginning to recover from his GI  bleed, but now again notes weakness, malaise, and fatigue.   Medications are unchanged from his last visit except for discontinuation  of bisoprolol and diclofenac and addition of Valtrex t.i.d.   Mr. Pickar notes no palpitations.  His chronic kidney disease is  asymptomatic.  His arthritis is not currently causing significant  difficulties.   On exam, pleasant overweight gentleman in no acute distress.  The weight  is 193, 4 pounds more than at his last visit.  Blood pressure 130/70,  heart rate 60 and fairly regular, respirations 14 and unlabored.  Neck:  No jugular venous distention; normal carotid upstrokes without bruits.  Lungs:  Clear.  Cardiac:  Irregular rhythm; normal first and second  heart sounds; modest systolic murmur over the left precordium.  Extremities:  Edema 1+.  Skin:  Crusting herpetic rash over the right  lower abdomen and flank and upper right thigh.   Rhythm strip:  Atrial fibrillation with controlled ventricular response;  heart rate is 80.   Most recent laboratories from last month.  Hemoglobin has improved from  a previous value of 9.8 in January to a current value of 11.3.  MCV is  normal.  Creatinine has also improved from a value of 1.86 in February  to a recent value of 1.42.   IMPRESSION:  Mr. Odriscoll is doing well from a cardiovascular standpoint.  His heart rate and atrial fibrillation is controlled, and he does not  appear to be  symptomatic from this condition.  His anticoagulation has  been variable with a previous INR of 4 and a current INR of 1.6.  We are  asking him to increase the amount of vitamin K in his diet, which  sometimes allows for more stable warfarin dosing.  Creatinine is  slightly improved relative to last year and substantially improved from  earlier this year.  We have adjusted his dose of warfarin and will  continue to follow him in anticoagulation clinic.    Sincerely,      Gerrit Friends. Dietrich Pates, MD, [facility]  Electronically Signed    RMR/MedQ  DD: 10/21/2008  DT: 10/22/2008  Job #: [id number]

## 2010-11-10 NOTE — Letter (Signed)
July 26, 2008    Edward L. Juanetta Gosling, M.D.  [address]  [place]  [place] [address]   RE:  James [name], James [name]  MRN:  [id number]  /  DOB:  12-Jul-1921   Dear Ed:   Mr. Yurchak returns to the office early after recent admission to [facility] with substantial GI bleeding.  He was transfused multiple  units and underwent a complete GI workup.  He was found to have a rectal  fissure.   INCOMPLETE DICTATION.    Sincerely,      Gerrit Friends. Dietrich Pates, MD, [facility]    RMR/MedQ  DD: 07/26/2008  DT: 07/27/2008  Job #: [id number]   CC:    A. Jeralene Peters., M.D.

## 2010-11-10 NOTE — H&P (Signed)
NAME:  James [name], STUEVE NO.:  [id number]   MEDICAL RECORD NO.:  [id number]          PATIENT TYPE:  INP   LOCATION:  A313                          FACILITY:  [facility]   PHYSICIAN:  Edward L. Juanetta Gosling, M.D.DATE OF BIRTH:  03-04-1922   DATE OF ADMISSION:  06/27/2008  DATE OF DISCHARGE:  LH                              HISTORY & PHYSICAL   REASON FOR ADMISSION:  GI bleeding.   HISTORY:  Mr. Whitter is an 77 year old who came to the emergency room  after having noted GI bleeding with bright red blood per rectum for the  last 24 hours or so.  He says he had been feeling well and he is not  having any abdominal pain or any other complaints.  He does not feel  particularly weak.  He just had some bleeding.  He is on Coumadin for  chronic atrial fibrillation.  When he came to the emergency room, his  INR had gone up from his last check, now at 30s, protime of 37, INR of  3.4.  He has not had any previous episode of GI bleeding.  He has had  another stool with blood in it this morning.   PAST MEDICAL HISTORY:  Positive for hypertension and history of  hemorrhoids.  He has had a hernia.  He has had atrial fibrillation and  he has had some fluid retention in the past.  He has also had an MRSA  infection of his scrotum.  He has had multiple orthopedic abnormalities  with multiple surgeries.  He has had a cholecystectomy, a  hemorrhoidectomy, a hip replacement and knee replacement, and a hip  fracture.   FAMILY HISTORY:  Positive for osteoarthritis.  He has had a brother who  had Down syndrome who died of complication of that.   SOCIAL HISTORY:  He lives at home with his wife.  He does not use  alcohol, tobacco, or illicit drugs.   LABORATORY DATA:  He is allergic to SUPRAX, ANTIBIOTIC, and OXYCONTIN.   MEDICATIONS AT HOME:  1. Potassium chloride 750 mg daily.  2. Altace as listed 40 mg daily, but I think that is incorrect.  3. Theophylline 200 mg b.i.d.  4. Misoprostol 200 mg  3 times a day.  5. Diclofenac 75 mg 3 times a day.  6. Lopressor 50 mg b.i.d.  7. Coumadin 5 mg daily.  8. Lasix 40 mg daily.  9. Allopurinol 300 mg daily.  10.Prilosec 20 mg daily.  11.Cardura 4 mg one-half tablet at bedtime.  12.Simvastatin 40 mg nightly.   REVIEW OF SYSTEMS:  Except as mentioned is negative.   PHYSICAL EXAMINATION:  VITAL SIGNS:  His blood pressure is in the 140/70  range and pulse is in the 70s.  GENERAL:  He looks fairly comfortable.  He is in no acute distress.  HEENT:  His pupils are reactive.  His mucous membranes are minimally  dry.  NECK:  Supple without masses.  No jugular venous distention.  CHEST:  Clear without wheezes, rales, or rhonchi.  HEART:  Regular without murmur, gallop, or rub.  ABDOMEN:  Soft.  No masses felt.  No tenderness.  EXTREMITIES:  No edema.  He has multiple surgical scars.  CENTRAL NERVOUS SYSTEM:  Grossly intact.   LABORATORY WORK:  His hemoglobin is 7.7, earlier it was 8.7.  He has  been receiving some IV fluids.  His PT 37 with an INR of 3.4 as  mentioned, metabolic profile shows BUN of 43, creatinine 1.83, glucose  of 102, and albumin is 3.4.   ASSESSMENT:  He has a lower gastrointestinal bleed, it could be a rapid  transit upper gastrointestinal bleed.  He is going to receive fresh  frozen plasma.  He is going to receive 2 units of packed red blood  cells.  We are going to try to stay 2 units ahead just in case.  He is  going to have q.4 h. hemoglobin and hematocrits for the next 24 hours.  I have discussed his case with Dr. Roetta Sessions.  Dr. Jena Gauss is a GI  consultant, but GI is not going to be available tomorrow, the first, the  second, or the third.  Dr. Jena Gauss suggests that we go ahead and give him  the fresh frozen plasma, give him the blood, give him some vitamin K,  and reassess him frequently if he continues to have significant  bleeding.  In particular if has any hemodynamic instability at all which  at this  point, he has none, we would suggest transfer to [facility].  I have discussed all of this with his family and with  Mr. Goth.      Edward L. Juanetta Gosling, M.D.  Electronically Signed     ELH/MEDQ  D:  06/27/2008  T:  06/28/2008  Job:  [id number]

## 2010-11-10 NOTE — Letter (Signed)
Nov 04, 2006    Ramon Dredge L. Juanetta Gosling, M.D.  [address]  [place], [place] [address]   RE:  IVAN, LACHER  MRN:  [id number]  /  DOB:  12/06/1921   Dear Renae Fickle,   Mr. Dasch returns to the office for continued assessment and treatment  of atrial fibrillation, requiring chronic anticoagulation.  As you know,  he was in the hospital some months ago for a scrotal abscess.  This  proved difficult to treat, but after multiple courses of antibiotics, he  has been discharged from the care of his urologist with apparent cure.  He reports no particular problems at present.  He has no dyspnea nor  chest pain.  He notes occasional palpitations.  He has had no light-  headedness or syncope.  He has chronic edema that is adequately managed  with conservative measures.   CURRENT MEDICATIONS INCLUDE:  1. Furosemide 40 mg daily.  2. Theophylline 200 mg b.i.d.  3. Diclofenac 75 mg t.i.d. p.r.n.  4. Doxazosin 4 mg daily.  5. Misoprostol 200 mg t.i.d. p.r.n.  6. Allopurinol 300 mg daily.  7. Warfarin as directed with somewhat variable anticoagulation.  8. Metoprolol 50 mg b.i.d.  9. Omeprazole 20 mg daily.  10.Lisinopril 40 mg daily.  11.KCL 10 mEq daily.  12.Simvastatin 40 mg daily.   ON EXAM:  A pleasant, older gentleman, in no acute distress.  The weight  is 202, eight pounds more than in August 2007.  Blood pressure 135/80,  heart rate 78 and irregular, respirations 16.  NECK:  No jugular venous distention, no carotid bruits.  LUNGS:  Clear.  CARDIAC:  Normal first and second heart sounds.  Grade 2/6 holosystolic  murmur, best heard at the apex.  ABDOMEN:  Soft and nontender.  No organomegaly.  EXTREMITIES:  One-plus pitting edema.  Distal pulses intact.   IMPRESSION:  Mr. Byington is doing generally well.  His atrial fibrillation  is asymptomatic.  We will continue anticoagulation and obtain a CBC and  stool for Hemoccult testing to monitor same.  His mitral valve disease  has also been  asymptomatic.  I will plan to obtain an echocardiogram at  his next visit to verify that left ventricular size is not increasing  nor function decreasing.  I will plan to see this nice gentleman again  in one year.    Sincerely,      Gerrit Friends. Dietrich Pates, MD, [facility]  Electronically Signed    RMR/MedQ  DD: 11/04/2006  DT: 11/04/2006  Job #: [id number]

## 2010-11-10 NOTE — Op Note (Signed)
NAME:  James [name], James [name] NO.:  [id number]   MEDICAL RECORD NO.:  [id number]          PATIENT TYPE:  AMB   LOCATION:  DAY                           FACILITY:  [facility]   PHYSICIAN:  Barbaraann Barthel, M.D. DATE OF BIRTH:  [birth year]   DATE OF PROCEDURE:  08/23/2007  DATE OF DISCHARGE:                               OPERATIVE REPORT   SURGEON:  Barbaraann Barthel, M.D.   PREOPERATIVE DIAGNOSIS:  Severe anal stenosis.   POSTOPERATIVE DIAGNOSIS:  Severe anal stenosis.   PROCEDURE:  1. Examination under anesthesia.  2. Rigid proctoscopy.  3. Left lateral internal sphincterotomy.   NOTE:  This is an 77 year old white male who had no previous surgery in  his perianal area and had last colonoscopy performed in 2006. Since that  time, he had progressive anal stenosis and he came to my office and  literally one had difficulty really examining him perianally and  certainly could not do anymore than insert the little finger in the anal  orifice due to severe stenosis.  There were no perianal lesions and  there was no history of inflammatory bowel disease or other problems in  that regard.  We had planned to take care of him as an outpatient and  examine him further under anesthesia and perform internal sphincterotomy  upon him.  We discussed the complications not limited to but including  bleeding, infection and the possibility that he may have a little  incontinence for awhile, however, this was preferable to his inability  to really pass any stool with any ease.  We, preoperatively, had him  checked by the cardiology department and Dr. Dietrich Pates who will follow  his Coumadin levels perioperatively and also Dr. Juanetta Gosling will continue  to follow him medically, as well.   TECHNIQUE:  The patient was placed in the jackknife prone position after  the adequate administration of spinal anesthesia.  We were unable to  really place a proctoscope due to the anal stenosis even under  anesthesia.  I did not want to force anything of that nature so we went  ahead and I digitally examined him and then performed a left lateral  internal sphincter sphincterotomy.  Then, of course, we were able to  easily place the anoscope and the rigid proctoscope which I examined him  up to about 20 cm, there were no other abnormalities noted in the anal  area.  After checking for hemostasis, which was controlled with the  cautery device, I then performed a perianal block placing 0.5%  Sensorcaine around the anal orifice for postoperative comfort and then  placed a viscous Xylocaine dressing over the anal area.  Prior to  closure, all sponge, needle, and instrument counts were found to be  correct.  Estimated blood loss was minimal.  The patient tolerated the  procedure well and was taken to the recovery room in satisfactory  condition.   Dr. Marvel Plan office has been informed of the need to follow up on his  Coumadin and I also called Dr. Juanetta Gosling to the let him know the  situation, as well.  Barbaraann Barthel, M.D.  Electronically Signed     WB/MEDQ  D:  08/23/2007  T:  08/23/2007  Job:  [id number]   cc:   Ramon Dredge L. Juanetta Gosling, M.D.  Fax: [phone]   Gerrit Friends. Dietrich Pates, MD, [facility]  [address]  [place], [place] [address]

## 2010-11-10 NOTE — Op Note (Signed)
NAME:  James [name], DOOLAN NO.:  [id number]   MEDICAL RECORD NO.:  [id number]          PATIENT TYPE:  AMB   LOCATION:  DAY                           FACILITY:  [facility]   PHYSICIAN:  Kassie Mends, M.D.      DATE OF BIRTH:  Jan 23, 1922   DATE OF PROCEDURE:  DATE OF DISCHARGE:                                PROCEDURE NOTE   PRIMARY GASTROENTEROLOGIST:  Jonathon Bellows, M.D.   PRIMARY PHYSICIAN:  Edward L. Juanetta Gosling, M.D.   PROCEDURE:  Givens capsule endoscopy.   INDICATION FOR EXAM:  Mr. Darden is an 77 year old male who presented  with black stools and bright red blood per rectum.  He had acute on  chronic anemia.  His anemia was transfusion dependent.  He had an upper  endoscopy which showed a noncritical Schatzki's ring and a small hiatal  hernia.  No source for his anemia, black stools or bright red blood per  rectum were identified.  The following day, he had an ileal colonoscopy.  He had frequent sigmoid colon diverticula.  He also was noted to have  anal stenosis with two anal fissures, as well as small internal  hemorrhoids.  No source for his melena was identified, but the bright  red blood per rectum was felt secondary to anal fissures.  He was asked  to stop the diclofenac and scheduled for a capsule endoscopy to evaluate  his obscure overt GI bleed.   PATIENT DATA:  Height 66 inches, weight 200 pounds, BMI 32.3 (obese),  gastric passage time 15 minutes, small bowel passage time 5 hours and 52  minutes.   FINDINGS:  1. Limited views of the gastric mucosa due to retained contents.  2. Occasional limited views of the small bowel mucosa.  Otherwise, no      evidence of AVMs, mass or ulcers seen.  No old blood or fresh blood      seen in the small intestines.  Limited views of the colonic mucosa      due to retained content.   DIAGNOSIS:  No high-risk lesion identified.  Mr. Essick has a history of  A fib, which has been managed as an outpatient with Coumadin.  No  source  for anemia identified. A small bowel ulcer from diclofenac may have been  missed on the small bowel capsule endoscopy.   RECOMMENDATIONS:  1. Start Anusol HC one pr q12h for 14 days. Continue Miralax.  2. Would restart his Coumadin and discontinue the use of diclofenac.      His GI evaluation is complete.  3. Follow up with Dr. Jena Gauss in one month, RE: anal fissure and rectal      bleeding.    Discussed with Dr. Juanetta Gosling and Mr. Minney.      Kassie Mends, M.D.  Electronically Signed     SM/MEDQ  D:  07/04/2008  T:  07/04/2008  Job:  [id number]   cc:   Ramon Dredge L. Juanetta Gosling, M.D.  Fax: [phone]

## 2010-11-10 NOTE — Consult Note (Signed)
NAME:  James [name], James [name]                 ACCOUNT NO.:  [id number]   MEDICAL RECORD NO.:  [id number]          PATIENT TYPE:  OIB   LOCATION:  A314                          FACILITY:  [facility]   PHYSICIAN:  Edward L. Juanetta Gosling, M.D.DATE OF BIRTH:  08-Aug-1921   DATE OF CONSULTATION:  08/23/2007  DATE OF DISCHARGE:                                 CONSULTATION   REASON FOR CONSULTATION:  Medical care.   HISTORY:  Mr. Cloward is an 77 year old who I have followed for some time  with multiple medical problems and who has had rectal surgery today.  He  also had an abscess on his scalp that was drained.   PAST MEDICAL HISTORY:  Is positive for:  1. Hypertension.  2. Gout.  3. Chronic obstructive pulmonary disease.  4. Pulmonary emboli.  5. Chronic bilateral lower extremity edema.  6. Atrial fibrillation.  7. Hemorrhoids.   He has had an episode of an abscess in his scrotum.  He says he is  feeling well.  He has no new complaints.  He has been on chronic  Coumadin therapy as well.   At home, he has been on:  1. Potassium chloride 10 mEq two daily.  2. Lisinopril 40 mg daily.  3. Theophylline 100 mg 2 tablets twice a day.  4. Misoprostol 200 mg three times a day.  5. Diclofenac 75 mg three times a day.  6. Lasix 40 mg daily.  7. Allopurinol 300 mg daily.  8. Omeprazole 20 mg daily.  9. Cardura 8 mg one-half daily.  10.Coumadin.   PAST SURGICAL HISTORY:  1. Has had total arthroplasty both knees.  2. Appendectomy.  3. Cholecystectomy.  4. A back surgery for a disc in 2004.  5. Circumcision 2005.  6. Hip arthroplasty in 2004.   SOCIAL HISTORY:  Does not use any tobacco, he does not use any alcohol,  does not use any illicit drugs.  He is married and lives at home with  his wife.   FAMILY HISTORY:  His mother died at 82 with breast cancer which was  metastatic.  His father in his 23s of unknown cause.  He has two living  brothers, both of them have severe osteoarthritis and he had a  brother  died of leukemia who was mentally retarded with Down syndrome.   REVIEW OF SYSTEMS:  Other than as mentioned. pretty much negative.   PHYSICAL EXAMINATION:  Shows a well-developed, well-nourished male who  appears to be in no acute distress.  He appears to be comfortable.  Has  a dressing on his head with a drain. His temperature is 100.5, pulse  108.  Respirations 20, blood pressure 139/55.  His pupils are equal, round and reactive to light and accommodation.  His nose and throat are clear.  His neck is supple without masses.  His chest is clear without wheezes, rales or rhonchi.  Heart is irregular.  His abdomen soft.  Extremities showed no edema.   ASSESSMENT:  He seems to be doing well.  He does have atrial  fibrillation and has been on chronic Coumadin  therapy and at this point,  I think we should have him continue with his treatments.  Cardiology has  seen him as well and I do not plan any other changes.  Thanks for  allowing me to see him with you.      Edward L. Juanetta Gosling, M.D.  Electronically Signed     ELH/MEDQ  D:  08/23/2007  T:  08/24/2007  Job:  [id number]

## 2010-11-10 NOTE — Consult Note (Signed)
NAME:  James [name], James [name] NO.:  [id number]   MEDICAL RECORD NO.:  [id number]          PATIENT TYPE:  INP   LOCATION:  A313                          FACILITY:  [facility]   PHYSICIAN:  R. Roetta Sessions, M.D. DATE OF BIRTH:  [birth year]   DATE OF CONSULTATION:  07/02/2007  DATE OF DISCHARGE:                                 CONSULTATION   REASON FOR CONSULTATION:  GI bleed.   HISTORY OF PRESENT ILLNESS:  Mr. Poch is an 77 year old Caucasian male.  He tells me approximately 6 days ago he awakened with epistaxis.  Two  days later, he awakened to incontinence, and he tells me he was covered  in melena.  He has noticed several episodes of melena since that time.  He has not noticed any bleeding in the last 2 days.  The last episode  was more of a bright red blood.  He has received 4 units of packed RBCs  and 2 units of plasma.  He was previously on Coumadin and diclofenac  t.i.d.  He takes Coumadin for atrial fibrillation and history of PE and  DVT.  He denies any nausea, vomiting or abdominal pain.  He has had some  anorexia, although his weight has remained stable.  He denies any  dysphagia, odynophagia or early satiety.  He denies any constipation or  diarrhea.  He has had some hemorrhoidal bleeding before but never melena  per his report.  He does think he had an EGD several years ago by Dr.  Karilyn Cota.  I cannot obtain those records at this time.  We will review the  chart at the office.   PAST MEDICAL AND SURGICAL HISTORY:  1. History of colonoscopy by Dr. Jena Gauss on December 26, 2003.  2. Anal papilla.  3. Internal hemorrhoids.  4. Left-sided diverticula.  5. History of anal stenosis, status post repair by Dr. Malvin Johns.  6. History of atrial fibrillation on chronic Coumadin.  7. History of pulmonary embolus.  8. DVT.  9. Hypertension.  10.Appendectomy.  11.Cholecystectomy.  12.Lower extremity edema.  13.MRSA.  14.Scrotal abscess.  15.Mild renal insufficiency.  16.COPD.  17.Osteoarthritis.  18.Status post bilateral knee replacements and right hip replacement.      Last surgery has been over 2 years ago.  19.History of hiatal hernia.  20.Gout.   MEDICATIONS PRIOR TO ADMISSION:  1. Potassium chloride 750 mg daily.  2. Altace 40 mg daily.  3. Theophylline 200 mg b.i.d.  4. Misoprostol 200 mg t.i.d.  5. Diclofenac 75 mg t.i.d.  6. Lopressor 50 mg b.i.d.  7. Coumadin 5 mg daily.  8. Lasix 40 mg daily.  9. Allopurinol 300 mg daily.  10.Prilosec 20 mg daily.  11.Cardura 20 mg q.h.s.  12.Simvastatin 40 mg q.h.s.  13.Multivitamin daily.  14.PreserVision daily.   ALLERGIES:  1. SUPRAX.  2. OXYCONTIN.   FAMILY HISTORY:  There is no known family history of colorectal  carcinoma, liver or chronic GI problems.  Mother deceased at 39  secondary to metastatic breast cancer.  He did lose a brother to  leukemia.  He had a  total of 7 siblings with history significant for  diabetes mellitus.  Loss of father at 60 due to old age.   SOCIAL HISTORY:  He is a retired Visual merchandiser.  He has been married for 57  years.  He denies any tobacco, alcohol or drug use.   REVIEW OF SYSTEMS:  HEENT:  He has noted a couple of episodes of  epistaxis last week.  PULMONARY:  He has had some shortness of breath.  Denies any cough or hemoptysis.  GI:  See HPI.  Denies any known history  of liver disease.  CARDIOVASCULAR:  Denies any chest pain or  palpitations.   PHYSICAL EXAMINATION:  VITAL SIGNS:  Temperature 98.7, pulse 101,  respirations 16, blood pressure 159/87, O2 saturation 98% on 3 liters.  GENERAL:  Ms. Torok is a pale-appearing elderly Caucasian male who is  alert, oriented, pleasant, cooperative, in no acute distress.  HEENT.  Sclerae clear, nonicteric.  Conjunctivae pale.  Oropharynx pink  and moist without any lesions.  NECK:  Supple without masses or thyromegaly.  CHEST:  Heart rate is irregularly regular.  LUNGS:  Decreased breath sounds bilaterally but no acute  distress.  ABDOMEN:  Protuberant with positive bowel sounds x4.  No bruits  auscultated.  Soft, nontender, nondistended.  The liver is palpable two  fingerbreadths below the right costal margin.  Unable to palpate  splenomegaly.  There is no rebound tenderness or guarding.  EXTREMITIES:  With 1+ pitting edema bilaterally.   LABORATORY STUDIES:  Hemoglobin 9.8, hematocrit 29.7, white blood cell  count 9.7 and platelet count of 123.  Pro time 18.6, INR 1.5.  Sodium  137, potassium 3.9, chloride 107, CO2 of 24, BUN 14, creatinine 1.26,  calcium 8.6 and glucose 135.  Urinalysis is positive for trace blood.   IMPRESSION:  Mr. Linker an 78 year old Caucasian male with acute GI bleed  admitted 4 days ago with melena.  He is status post 4 units of packed  RBCs and 2 units of plasma.  His hemoglobin is stable.  He has had no  bleeding in the last few days per his report.  I suspect upper GI bleed  secondary to nonsteroidal anti-inflammatory drugs as the culprit in  combination with Coumadin.  Cannot rule out small bowel etiology such as  AVM or less likely lower GI source.  INR is 1.5 today.  He does have  thrombocytopenia with no known history of liver disease.  On exam, he  does have mild hepatomegaly and may have an underlying hepatic problem.   PLAN:  1. EGD with Dr. Jena Gauss as soon as possible.  2. Agree with PPI and transfusion to keep hemoglobin and hematocrit      stable around 9 grams.  3. Recommendations for Coumadin and resumption of NSAIDs for his      significant osteoarthritis.  Will follow pending his procedure.  4. Will defer use of prophylactic antibiotics for EGD, given his      history of joint replacements, to Dr. Jena Gauss.  It has been greater      than 2 years since his last surgery.   I would like to thank Dr. Juanetta Gosling for allowing [place] to participate in the  care of Mr. Apodaca.      Lorenza Burton, N.P.      Jonathon Bellows, M.D.  Electronically Signed    KJ/MEDQ   D:  07/01/2008  T:  07/01/2008  Job:  [id number]   cc:   Ramon Dredge L.  Juanetta Gosling, M.D.  Fax: [phone]

## 2010-11-10 NOTE — Group Therapy Note (Signed)
NAME:  James [name], James [name] NO.:  [id number]   MEDICAL RECORD NO.:  [id number]          PATIENT TYPE:  INP   LOCATION:  A313                          FACILITY:  [facility]   PHYSICIAN:  Edward L. Juanetta Gosling, M.D.DATE OF BIRTH:  May 27, 1922   DATE OF PROCEDURE:  DATE OF DISCHARGE:  07/02/2008                                 PROGRESS NOTE   Mr. Nauert had his EGD done yesterday and he states that he is feeling a  little bit better.  He also is set for colonoscopy today.  He states he  does not have any new complaints.  He has not noticed anymore blood.  His blood pressure was high after his endoscopy yesterday, but came down  fairly quickly.   PHYSICAL EXAMINATION:  GENERAL:  This morning shows that he is awake and  alert.  CHEST:  Clear.  VITAL SIGNS:  Temperature 98.6, pulse 99, respirations 22, blood  pressure 153/90, and O2 sats 97% on 2 L and he is set, as mentioned, for  colonoscopy.   His EGD showed a noncritical Schatzki ring, small hiatal hernia, normal  stomach and duodenum, and so he is going to go ahead and have a  colonoscopy.      Edward L. Juanetta Gosling, M.D.  Electronically Signed     ELH/MEDQ  D:  07/02/2008  T:  07/02/2008  Job:  [id number]

## 2010-11-13 NOTE — H&P (Signed)
NAME:  James [name], James [name]                 ACCOUNT NO.:  [id number]   MEDICAL RECORD NO.:  [id number]          PATIENT TYPE:  INP   LOCATION:  NA                           FACILITY:  [facility]   PHYSICIAN:  James [name], M.D.  DATE OF BIRTH:  03/25/1922   DATE OF ADMISSION:  07/20/2004  DATE OF DISCHARGE:                                HISTORY & PHYSICAL   James [name] is an 77 year old white male with bilateral knee pain, right  greater than left, for approximately 10-12 years.  Pain and stiffness is  worse over the past three years.  The patient's right knee pain limits his  ambulation.  There is pain and stiffness.  He has a difficult time rising  from a sitting position.  Patient denies any mechanical symptoms.  No rest  pain.  The patient uses a cane to ambulate outside of his home.  He has  failed conservative treatment, which included cortisone injections.  X-rays  of the right knee show severe varus deformity of the knee with bone-on-bone.   Patient is to undergo a right total knee arthroplasty by Dr. Priscille [name] with  possible use of computer navigation on July 20, 2004.   ALLERGIES:  OXYCONTIN, which causes itching.  Note:  The patient tolerates  Percocet.   MEDICATIONS:  1.  Potassium chloride 10 mEq 2 tablets b.i.d.  2.  Lisinopril 40 mg 1 tablet q.a.m.  3.  Theophylline 100 mg 2 tablets b.i.d.  4.  Arthrotec 200 mg 1 tablet t.i.d.  5.  Lopressor 50 mg tabs 1 tablet b.i.d.  6.  Lasix 40 mg 1 tablet q.d.  7.  Allopurinol 300 mg 1 tab q.d.  8.  Prilosec 20 mg 1 capsule q.d.  9.  Cardura 8 mg 1/2 tablet by mouth q.d.  10. Multivitamin 1 q.d.  11. Coumadin 5 mg on Monday and Friday, 2.5 mg on Tuesday, Wednesday,      Thursday, Saturday, and Sunday.  12. Docusate 100 mg b.i.d.   PAST MEDICAL HISTORY:  1.  Hypertension.  2.  Hiatal hernia.  3.  History of gout.  4.  Heart murmur.  5.  COPD.  6.  Bilateral lower extremity edema.  7.  History of pulmonary emboli in 1991.  8.   Irregular heartbeat (A fib).  9.  Hemorrhoids.   PAST SURGICAL HISTORY:  1.  Appendectomy in 1937.  2.  Cholecystectomy in 1978.  3.  Surgery for broken ________ in 1996.  4.  Back surgery in 2004.  5.  Right total hip arthroplasty in 2004.  6.  Circumcision in 2005.   Patient denies complications of anesthesia with any of the above procedures.   SOCIAL HISTORY:  Patient denies tobacco or alcohol use.  He is married and  has three grown children.   PRIMARY CARE PHYSICIAN:  Dr. Kari [name], phone number [phone].   CARDIOLOGIST:  Dr. Dorethea [name] at [facility].   Patient lives in a one-story home with a ramp into the usual entrance.  Patient is a retired Visual merchandiser.   FAMILY HISTORY:  Patient's mother  deceased at age 75 with breast cancer with  metastasis to bone.  Patient's father deceased at age 68, unknown cause of  death.  He has two living brothers, both have osteoarthritis. One has  diabetes mellitus and heart disease.  He has four deceased brothers, one who  passed away at age 19 due to complications with strep throat, one in his 30s  due to lymphoma, the next brother deceased in his 66s due to leukemia, and  another in his 34s with Down's syndrome.   REVIEW OF SYSTEMS:  The 14-point review of systems reveals no recent cold,  cough, fever, or flu-like symptoms.  Patient denies any chest pain.  He has  shortness of breath with exertion.  He denies PND or orthopnea.  He has  reflux and problems with constipation.  No GU symptoms.  He has lower leg  edema bilaterally.  History of cataracts in both eyes.  He has a history of  gout.  Patient has right shoulder pain.  Left ring finger that triggers.  He  wears bifocals.  Otherwise, the review of systems is negative.   He has no living will.  Power-of-attorney is James [name] and James [name].   PHYSICAL EXAMINATION:  VITAL SIGNS:  GENERAL:  Patient is a well-developed and well-nourished male who walks with  a very slow gait.   Patient's mood and affect are appropriate.  He talks  easily with the examiner.  Patient is accompanied by his wife today.  HEENT:  Head is normocephalic and atraumatic without prominent maxillary  sinus tenderness.  Conjunctivae are pink.  PERRLA.  EOMs are intact.  Sclerae are anicteric.  No visible external ear deformities are noted.  TM's  are pearly gray bilaterally.  Nose:  Nasal septum midline.  Nasal mucosa is  pink and moist without polyps.  Tongue and uvula are midline.  Good  dentition.  Pharynx without erythema or exudate.  RESPIRATORY:  Clear to auscultation bilaterally without wheezes, rales or  rhonchi.  CARDIAC:  Heart rate is regular.  The patient has a 3/6 systolic murmur that  is heard best at the fourth and fifth intercostal space near the sternal  border.  ABDOMEN:  Positive bowel sounds throughout.  MUSCULOSKELETAL:  Cervical spine with full range of motion without pain.  Carotids are 2+ without bruits.  No lymphadenopathy noted.  Palpation over  the lumbar and thoracic spine reveals no tenderness.  BREASTS/GENITOURINARY/RECTAL:  Deferred.  NEUROLOGIC:  Patient is alert and oriented x 3.  Cranial nerves II-XII are  grossly intact.  Knee jerks are 2+.  Ankle jerks are absent symmetrically.  Dorsalis pedis pulses are 2+.  EXTREMITIES:  Upper extremities, shoulders, elbows, wrists, and hands are  equal and symmetric bilaterally.  Patient has marked crepitus in the right  shoulder with passive range of motion.  He has decreased range of motion of  the shoulder.  Full flexion is approximately 100-110 degrees, abduction to  90 degrees.  Forward flexion of the left shoulder approximately 160-170  degrees, abduction approximately 160 degrees.  Otherwise, he has full range  of motion of the elbows and wrist.  He does have a triggering finger on the  left hand.  Radial pulses are 2+ bilaterally.  Lower extremities:  Bilateral hips:  Left hip:  External rotation is 35 degrees.   Internal rotation of the  left hip is 10 degrees.  Right hip approximately 20-30 degrees of external  rotation.  Internal rotation is almost nonexistent.  Left  knee is edematous,  but there is no effusion and no erythema.  He has full extension and flexion  to 112 degrees.  He has a 12 degree varus deformity of the left knee.  Anterior drawer is negative.  Valgus-varus stressing reveals no laxity.  Right knee:  No effusion.  Mild edema.  There is no erythema about the knee.  He has full extension and flexes to 110 degrees.  There is approximately 110  degrees of varus deformity.  Anterior drawer is negative.  Valgus-varus  stressing is negative.  Palpation along both mediolateral joint lines  bilaterally causes no pain.  Bilateral edematous lower legs.  Dorsalis pedis  pulses are 2+ bilaterally.   X-RAYS:  X-rays of the bilateral knees shows end-stage osteoarthritis with  severe varus deformity of both knees and bone-on-bone.   IMPRESSION:  1.  End-stage osteoarthritis with varus deformity and bone-on-bone, right      knee pain, greater than left.  2.  History of right total hip arthroplasty.  3.  Hypertension.  4.  Hiatal hernia.  5.  History of irregular heartbeat.  6.  Heart murmur.  7.  History of gout.  8.  Bilateral lower extremity edema.  9.  Chronic obstructive pulmonary disease.  10. History of pulmonary emboli.  11. Left trigger finger.  12. History of cataracts.  13. Hemorrhoids.   PLAN:  Patient is to be admitted to [facility] on July 20, 2004  to undergo a right total knee arthroplasty with computer navigation.  Prior  to surgery, patient will undergo all preoperative testing and labs.  Patient  is to obtain written consent from Dr. Dorethea [name] prior to his upcoming surgery  and have it faxed to our office.  He did receive verbal consent from Dr.  Dorethea [name] for surgical clearance.      GC/MEDQ  D:  07/15/2004  T:  07/15/2004  Job:  [id number]

## 2010-11-13 NOTE — Group Therapy Note (Signed)
NAME:  ELADIO, DENTREMONT NO.:  [id number]   MEDICAL RECORD NO.:  [id number]          PATIENT TYPE:  INP   LOCATION:  A331                          FACILITY:  [facility]   PHYSICIAN:  Edward L. Juanetta Gosling, M.D.DATE OF BIRTH:  Jan 06, 1922   DATE OF PROCEDURE:  DATE OF DISCHARGE:                                 PROGRESS NOTE   Mr. [name] is overall doing fairly well.  He says that his scrotum is not  as inflamed but he still has a very hard lesion.   His temperature is 98.2, pulse 75, respirations 18, blood pressure  145/82.  His exam of his scrotum shows that he still has an abscessed area there,  which is not draining very much.   His Pro Time was 36.5 this morning, INR of 3.4.  His he is off Coumadin.  I do not think he will need Lovenox right now because he is still fully  anticoagulated with Coumadin, so I am not going to add Lovenox at this  point.   PLAN:  To continue with everything else including his antibiotics et  Karie Soda.  I have asked [facility] to se him to see about whether  he needs to bridge his Coumadin or not.      Edward L. Juanetta Gosling, M.D.  Electronically Signed     ELH/MEDQ  D:  09/14/2006  T:  09/14/2006  Job:  [id number]

## 2010-11-13 NOTE — Op Note (Signed)
NAME:  SOU, NOHR NO.:  [id number]   MEDICAL RECORD NO.:  [id number]                   PATIENT TYPE:  INP   LOCATION:  5041                                 FACILITY:  [facility]   PHYSICIAN:  John L. Rendall III, M.D.           DATE OF BIRTH:  06-07-1922   DATE OF PROCEDURE:  05/01/2003  DATE OF DISCHARGE:                                 OPERATIVE REPORT   PREOPERATIVE DIAGNOSIS:  Osteoarthritis, right hip, status post previous  intertrochanteric femur fracture.   POSTOPERATIVE DIAGNOSIS:  Osteoarthritis, right hip, status post previous  intertrochanteric femur fracture.   OPERATION PERFORMED:  Complex total hip replacement.   SURGEON:  John L. Rendall, M.D.   ASSISTANT:  Legrand Pitts. Duffy, P.A.   ANESTHESIA:  General.   PATHOLOGY:  The patient has a healed intertrochanteric subtrochanteric  fracture with screw holes in the proximal femur where plate and screws have  been removed.  There is also a path through which the lag screw went up the  femoral neck into the femoral head and then there is the question of whether  the piriformis fossa lines up with the femoral canal or has healed in an  offset manner.  All of these could lead to femoral fracture during  implantation of the stem.   DESCRIPTION OF PROCEDURE:  Under general anesthesia, the patient was placed  in left lateral decubitus position.  The hip was prepared with DuraPrep and  draped as a sterile field.  The upper portion of his previous hip incision  is incorporated into a posterior total hip incision.  A Charnley retractor  was inserted after splitting the iliotibial band in the line of its fibers.  The short external rotator and hip capsule were taken down from the  posterior femoral neck.  At this point rather than torque the leg, decision  was made to cut the femoral neck with the head in place to avoid any  rotational stress on this femur weakened by previous plate with screw  holes.  In this manner, femoral neck was osteotomized, femoral head was left in the  socket.  The piriformis fossa was exposed. A canal finder was placed down  the canal.  The C-arm was obtained to make sure that straight line reaming  occurs from the piriformis fossa distally.  The canal was then progressively  reamed up to 14.22mm with good cortical contact with the 13.5 and 14.5  reamers.  Great care was taken to make sure there was no stress at the screw  holes.  Once this was complete, the 12 mm narrow rasp was inserted, followed  by the 13.5 followed by the 15.  Due to previous hip surgery, the standard  rasp does not fit the femoral neck.  With the femoral side thus prepared for  a permanent component, the acetabulum was exposed.  Two cobras used  inferiorly, two wing  retractors superiorly.  The labrum was excised, the fat  pad at the base of the acetabular fossa was removed.  The acetabulum was  then reamed with a 44, 50, 52, 54, 55, 56 and 57 reamers.  A trial placement  of a 56 cup bottoms out nicely. A permanent tri-spike 300 cup was then  inserted.  A trial acetabular socket was inserted and trial seating of a  femoral head and neck on a rasp was done.  It was determined that the hip  had a tendency to posterior dislocate.  This was corrected by using an  anteverted trial stem at first, but not enough correction was obtained.  Decision was then made to disimpact the acetabular socket and reorient it  for more posterior coverage.  With it thus done, the acetabulum was reseated  very solidly.  Trial range of motion now revealed excellent fit, alignment  and stability.  It was necessary to remove osteophytes off the medial  greater trochanter where they were causing pressure increasing the chance  for dislocation.  With these all removed, the apex hole eliminator was  inserted.  The +4 10 degree poly for size 36 hip ball was then inserted.  The permanent 15 mm narrow Prodigy stem  was then inserted and the +1.5 36 mm  hip ball was impacted.  Excellent fit, alignment and stability of all  components were obtained.  The hip was stable through normal range of  motion.  At this point the wound was copiously irrigated, it was then closed  in layers, first closed in layers first closing the posterior hip capsule  and short external rotators with #1 Tycron, iliotibial band with #1 Tycron,  subcutaneous with #1 and 2-0 Vicryl and skin clips.  Operative time  approximately an hour and 45 minutes.  The estimated blood loss was less  than .  The patient tolerated the procedure well and returned to  recovery in good condition.                                               John L. Dorothyann Gibbs, M.D.    Renato Gails  D:  05/01/2003  T:  05/01/2003  Job:  [id number]

## 2010-11-13 NOTE — H&P (Signed)
NAME:  James [name], James [name]                           ACCOUNT NO.:  [id number]   MEDICAL RECORD NO.:  [id number]                   PATIENT TYPE:  INP   LOCATION:  NA                                   FACILITY:  [facility]   PHYSICIAN:  John L. Rendall, M.D.               DATE OF BIRTH:  1922-05-06   DATE OF ADMISSION:  05/01/2003  DATE OF DISCHARGE:                                HISTORY & PHYSICAL   CHIEF COMPLAINT:  Right hip pain since 1996.   HISTORY OF PRESENT ILLNESS:  This 77 year old white male patient presented  to Dr. Priscille [name] with history of a right hip fracture that was treated by Dr.  Tresa [name] in February 1996 with compression screw fixation.  He said he has  had some pain in that hip since that time, but since last summer, the pain  had been getting progressively worse.  In February of this year, Dr James [name]  removed the hardware of the hip, and then the patient subsequently underwent  back surgery for a herniated disk in his lumbar spine, but the pain in his  hip has continued and been getting progressively worse day by day.   At this point, the pain in that hip is constant with any weight bearing.  He  describes it as a hurting pain.  The pain seems to be located over the  lateral aspect of the greater trochanter and down the lateral side to the  knee at times.  The pain increases with any weight bearing and decreases if  he gets off the leg.  The pain does not keep him up at night and there are  no paresthesias associated with the pain, but the hip does pop and grind.  He was using a cane for many years, but over the last 4-5 weeks he has had  to progress to a walker because the hip hurt so badly.  He has tried Aleve,  Vicodin, Tylenol for pain and all that provides no real relief.  He has  received cortisone injections in the past in that hip, and that did not  provide any relief.   ALLERGIES:  OXYCODONE, causes pruritus and a rash.   MEDICATIONS:  1. Allopurinol 300 mg one  tablet p.o. q.a.m.  2. Altace 5 mg one tablet p.o. b.i.d.  3. Prilosec 20 mg one tablet p.o. q.a.m.  4. Lasix 40 mg one tablet p.o. q.a.m.  5. Cardura 4 mg one tablet p.o. q.p.m.  6. Lopressor 50 mg one tablet p.o. b.i.d.  7. Arthrotec 75 mg one tablet p.o. b.i.d. p.r.n. pain.   PAST MEDICAL HISTORY:  1. Hypertension diagnosed about 20 years ago.  2. Hiatal hernia diagnosed 25 years ago.  3. He reports he has had an irregular heart beat all of his life.  4. Gout.  5. He had a pulmonary emboli in 1991 which they do not have an  identifiable     cause for this.  He had no had surgery prior to developing that.  He was     treated with Coumadin for quite a long time, but he has been off that for     awhile now.  6. Bilateral lower extremity edema treated with TED hose.  7. Mild COPD.  8. He denies any history of diabetes mellitus, thyroid disease, peptic ulcer     disease, asthma or any other chronic medical condition other than noted     previously.   PAST SURGICAL HISTORY:  1. Appendectomy by Dr. Eunice [name] in 1937.  2. Open cholecystectomy by Dr. Berneda [name] in the 1970's.  3. Right compression screw fixation of a hip fracture by Dr. Vear [name]     in 1996.  4. Removal of hardware, right hip, by Dr. Erasmo [name] February 2004.  5. Lumbar discectomy L4-5 by Dr. Donalee [name] in 2004.  6. Removal cataract, right eye, by Dr. Nile [name].  7. He reports he had phlebitis after his hip surgery in 1996, but no other     complications after surgeries.   SOCIAL HISTORY:  He denies any history of cigarette smoking, alcohol use or  drug use.  He is married and has four children, three who are living.  He  and his wife live in a one story house with a ramp into the main entrance.  He is a retired Visual merchandiser, and he used to farm tobacco, James [name].  His medical doctor is Dr. Shaune [name] in [place], and his phone number is  [phone].  He has given him a clearance letter for surgery.   FAMILY  HISTORY:  Mother died at the age of 5 with breast cancer with  metastases to the bone.  His father died at age 63 with no major medical  problems.  He has two living brothers, age 30 and 4.  Both with  osteoarthritis and one with diabetes.  He had four brothers who passed away,  one at age 26 with tonsillitis and one in his 41s with lymphoma, one in his  67's with leukemia and one in his 59's with Down Syndrome.  His children at  age 55, 62 and 8 and the only medical problems they have are asthma.  He  had one son who died at age 3 with muscular dystrophy or amyotonia  congenita.   REVIEW OF SYSTEMS:  He does have a cataract in his left eye.  He has some  allergy in his ears which is treated with ear drops.  He has had problems  with urinary frequency and urgency after epidural steroids, but that has  resolved, and his urologist is Dr. Vonita [name], and he found no problems in his  most recent visit.  When he does have a gout attack, he gets it in his right  great toe, and his last attack was after his surgery in February, and Dr.  Juanetta [name] has recently placed him on Allopurinol.  He does wear glasses.  He  does not have a living will, and his Power of James [name] is his wife,  James [name].  All other systems are negative and noncontributory.   PHYSICAL EXAMINATION:  GENERAL APPEARANCE:  Well-developed, well-nourished,  mildly overweight white male who walks very slowly with an antalgic gait and  use of a walker.  Mood and affect are appropriate.  Talks easily with  examiner.  Accompanied by his wife.  VITAL SIGNS:  Height 5 feet  6 inches, weight 194 pounds, BMI is 30.  Temperature 97.6, pulse 64, respirations 16, blood pressure 168/96.  HEENT:  Normocephalic, atraumatic without frontal or maxillary sinus  tenderness to palpation.  Conjunctivae are pink.  Sclerae are anicteric.  PERRLA.  Pupils are slightly irregularly shaped, but otherwise, reactive. EOM's intact.  No visible external ear  deformities.  Hearing grossly intact.  Tympanic membranes pearly grey bilaterally with good light reflex.  Nose and  nasal septum midline.  Nasal mucosa pink and moist without exudates or polys  noted.  Nuchal mucosa pink and moist.  Good dentition.  Pharynx without  erythema or exudate.  Tongue and uvula midline.  Tongue without  fasciculation and uvula rises equally with phonation.  NECK:  No visible masses or lesions noted.  Trachea midline.  No palpable  lymphadenopathy or thyromegaly.  Carotids +2 bilaterally without bruits.  He  has full flexion of the neck, but has markedly decreased extension and then  equally decreased lateral bending and rotation to each side.  This causes a  little bit of pain with range of motion, but no pain with palpation along  the cervical spine.  CARDIOVASCULAR:  Heart rate and rhythm are regular.  S1, S2 present with a  grade 2/6 systolic murmur heard best at the left fourth or fifth intercostal  space mid clavicular line.  RESPIRATORY:  Respirations even and unlabored.  Breath sounds clear to  auscultation bilaterally without rales or wheezes noted.  ABDOMEN:  Rounded abdominal contour.  Bowel sounds present times four  quadrants.  Well healed abdominal incision lines.  He is soft, nontender to  palpation without hepatosplenomegaly nor CVA tenderness.  Femoral pulses +2  bilaterally.  Nontender to palpation along the vertebral column.  BREAST/GU/RECTAL:  These exams deferred at this time.  MUSCULOSKELETAL:  He has no obvious deformities bilateral upper extremities  with full range of motion of his elbows, wrists and fingers bilaterally.  Radial pulses are +2.  He has decreased range of motion of both shoulders.  The right a little bit more so than the left.  He only has forward flexion  of both shoulders to about 100 degrees.  Abduction about 90 degrees.  Some  decrease in the internal/external rotation bilaterally, but it seems to be  even.  Right  shoulder, however, has a large amount of crepitus with range of  motion and forward flexion seems to be decreased only to about 80 degrees on  the right.  He has full range of motion of his ankles and toes bilaterally.  DP and PT pulses are +2.  He has +2 mildly pitting edema of both lower  extremities, but does have TED hose in place.  He has no calf pain with  palpation and negative Homan sign bilaterally.  Left hip has full extension  and flexion to 90 degrees.  There is no pain with palpation about the groin  or trochanter.  He has good internal/external rotation of the left hip.  No  crepitus or grinding noted with range of motion of the hip.  He has full  extension of his knee and flexion to 120 degrees, but it does appear to be  in about 5 or 10 degrees of varus.  There is a moderate crepitus with range  of motion of the knee, but no pain with palpation along the joint line at  this time and no effusion.  Stable to varus and valgus stress.  Right hip has full extension  but flexion only to 60 degrees, and this causes pain, and  the hip does externally rotate as you bring the leg up into flexion.  He  also has more pain in relaxing the leg back down into full extension of the  hip after it has been flexed.  He has absolutely no internal/external  rotation of that right hip at this time, and he has only maybe 15 degrees of  abduction of that hip and that does cause pain.  No pain with palpation in  the right groin or over the greater trochanter.  He has a well-healed  lateral thigh incision line on the right hip, and it is intact without  erythema or ecchymosis and no drainage.  Right knee has full extension and  flexion to 90 degrees with a fair amount of crepitus with range of motion.  There is no pain with palpation along the joint line at this time, and there  is no effusion.  He is stable to varus and valgus stress, and that knee also  appears to be in a bit of varus position.   NEUROLOGICAL:  Alert and oriented times three.  Cranial nerves II-XII are  grossly intact.  Strength 5/5 bilateral upper and lower extremities with the  only weakness being right hip.  Flexor strength is probably 4/5 strength.  Deep tendon reflexes are 2+ bilateral upper and lower extremities.   RADIOLOGICAL FINDINGS:  X-rays taken of his right knee and hip in May 2004  showed well-healed intertrochanteric fracture of the hip with large areas of  calcification.  The joint space appeared to be well maintained at that time,  but there were some arthritic changes along the extreme superior border of  the hip.  X-rays of his knee at that time showed severe end-stage  osteoarthritis with bone-on-bone contact in the medial compartment of the  knee and slight compression of the medial compartment.  Repeat x-rays done  of his right hip in October 2004 showed severe deterioration of that right  hip in the joint space.   IMPRESSION:  1. End-stage osteoarthritis/traumatic osteoarthritis, right hip.  2. Osteoarthritis bilateral knees, right worse then left.  3. Hypertension.  4. Hiatal hernia.  5. History of irregular heart beat.  6. Heart murmur.  7. Gout.  8. Bilateral lower extremity edema.  9. Chronic obstructive pulmonary disease.  10.      History of pulmonary emboli treated with anticoagulation in 1991.   PLAN:  Mr. Redd will be admitted to [facility] on May 01, 2003, where he will undergo a right total hip arthroplasty by Dr. Jonny Ruiz L.  Rendall.  He will undergo all the routine preoperative laboratory tests and  studies prior to this procedure.  The patient reports his cardiologist is  Dr. Daleen Squibb, so if we have any medical problems while he is hospitalized  related to his heart, we will consult him.  He will notify Dr. Juanetta [name] of  his blood pressure  reading today along with the readings his wife has taken at home and also  the possibility of a heart murmur, and have him  evaluate whether or not he needs to see a cardiologist prior to surgery.  If we have any medical issues  while the patient is hospitalized, we will consult one of the hospitalists.      Legrand Pitts Duffy, P.A.  John L. James [name], M.D.    KED/MEDQ  D:  04/25/2003  T:  04/25/2003  Job:  [id number]

## 2010-11-13 NOTE — H&P (Signed)
NAME:  James [name], James [name] NO.:  [id number]   MEDICAL RECORD NO.:  [id number]                   PATIENT TYPE:   LOCATION:                                       FACILITY:   PHYSICIAN:  John L. Rendall, M.D.               DATE OF BIRTH:  05-10-1922   DATE OF ADMISSION:  08/06/2002  DATE OF DISCHARGE:                                HISTORY & PHYSICAL   CHIEF COMPLAINT:  Painful right lateral hip.   HISTORY OF PRESENT ILLNESS:  The patient is a 77 year old white male with a  history of hip fracture in 1996, repaired with a compression screw and  plate.  The patient notes always having some discomfort along the incision,  but it has significantly worsened over time.  The patient does have  increased pain with sitting for long periods of time.  X-rays reveal  excellent healing of his right hip.   ALLERGIES:  No known drug allergies.   MEDICATIONS:  1. Cardura 4 mg p.o. daily.  2. Lopressor 50 mg p.o. daily.  3. Altace 5 mg p.o. daily.  4. Lasix 40 mg p.o. daily.  5. Arthrotek two tablets p.o. daily.  6. Prilosec 20 mg p.o. daily.   PAST MEDICAL HISTORY:  1. Hypertension.  2. Hiatal hernia.  3. Mild chronic obstructive pulmonary disease.  4. Cataracts.  5. Pulmonary emboli in 1991.  6. History of kidney stones.   PAST SURGICAL HISTORY:  1. Appendectomy in 1938.  2. Cholecystectomy in 1980.  3. Cataract surgery in 2000.   The patient denies any complications.   SOCIAL HISTORY:  The patient is a healthy-appearing 77 year old white male  with denial of smoking and alcohol use.  The patient is married.  He does  have several grown children.  He lives in a one story house with his wife.  He is a retired Visual merchandiser.   FAMILY PHYSICIAN:  Dr. Kari Baars at [phone].   FAMILY HISTORY:  Mother is deceased from bone and breast cancer.  Father is  deceased from congestive heart failure.  The patient has four brothers  deceased, one from Hodgkin's  disease, one from ACL, one from Down syndrome,  and one from severe tonsillitis at 75 years of age.  The patient has two  brothers alive, one of them with a history of a pacemaker at 75 years of  age.   REVIEW OF SYMPTOMS:  Positive for bronchitis several weeks ago.  Took  antibiotics for two weeks, and currently has no residual symptoms.  He does  wear glasses.  He does have occasional shortness of breath with exertion  which relates to his chronic obstructive pulmonary disease, but no  complaints of chest pain or diaphoresis.   PHYSICAL EXAMINATION:  VITAL SIGNS:  Temperature is 97.7, pulse is 70,  respirations are 16, blood pressure is 160/90.  GENERAL:  This is  a healthy-appearing elderly male physically fit-appearing,  does ambulate with a slight right-sided awkward gait.  He is able to get on  and off the examination table without any difficulty.  HEENT:  Head was normocephalic, atraumatic.  Nontender over maxillary and  frontal sinuses.  Pupils equal, round, reactive, and accommodate to light.  He does have a left-sided cataract.  Conjunctivae is pink and moist.  Sclerae is not icteric.  External ears are without deformities.  Canals  patent.  Tympanic membranes pearly gray, and gross hearing is intact.  Nasal  septum was midline.  Oral buccal mucosa was pink and moist without lesions.  Dentition was in fair repair.  Uvula was midline.  The patient is able to  swallow without any difficulty.  NECK:  Supple, no palpable lymphadenopathy.  Thyroid gland was nontender.  The patient had good range of motion of his cervical spine without any  difficulty or tenderness.  CHEST:  Lung sounds were clear and equal bilaterally.  No wheezes, rhonchi,  or rales noted.  HEART:  Slightly irregular rhythm, sounds like a regular rhythm with  occasional extra beats.  No murmurs, rubs, or gallops noted.  ABDOMEN:  Large, soft, nontender, bowel sounds throughout.  He did have a  large right-sided  ventral hernia which was nontender.  EXTREMITIES:  Upper extremities were symmetrically sized and shaped.  He had  good range of motion of his shoulders, elbows, and wrist.  Motor strength  was 5/5.  Lower extremities:  Right hip had full extension, flexion up to  100 degrees.  He had approximately 15 degrees internal external rotation,  all range of motion causing some slight discomfort along the lateral thigh  along the incision and the posterior proximal femur.  Left hip had full  extension and flexion up to 115 degrees with 20 degrees of internal and  external rotation without any discomfort.  Right knee had a moderate amount  of crepitus, lacked 5 degrees of full extension, no significant instability,  no effusion.  Left knee was normal-appearing, full extension, flexion back  to 120 degrees, no instability, calves were nontender.  Ankles were  symmetrical with good dorsi and plantar flexion.  Peripheral vasculature:  Carotid pulses were 2+ and brisk, radial pulses 2+, posterior tibial and  dorsalis pedis pulses were 1+ with no significant varicosities or edema.  NEUROLOGIC:  The patient was conscious, alert, and appropriate, held an easy  conversation with the examiner.  Cranial nerves II-XII intact.  Deep tendon  reflexes of the upper and lower extremities were symmetrical.  He was  grossly neurologically intact with no deficits.  BREASTS:  Deferred at this time.  RECTAL:  Deferred at this time.  GENITOURINARY:  Deferred at this time.   IMPRESSION:  1. Painful right hip, status post compression screw and plate fixation with     good signs of fracture healing.  2. Hypertension.  3. Hiatal hernia.  4. Mild chronic obstructive pulmonary disease.  5. History of pulmonary emboli in 1991.  6. Left-sided cataract.  7. History of kidney stones.    PLAN:  The patient will be admitted to [facility] on 08/06/02, under the care of Dr. Jonny Ruiz Rendall.  The patient will undergo all  routine labs and  tests prior to having removal of his right hip hardware.     Jamelle Rushing, P.A.  John L. Priscille Kluver, M.D.    RWK/MEDQ  D:  07/26/2002  T:  07/26/2002  Job:  [id number]

## 2010-11-13 NOTE — Discharge Summary (Signed)
NAME:  James [name], James [name] NO.:  [id number]   MEDICAL RECORD NO.:  [id number]          PATIENT TYPE:  ORB   LOCATION:  4503                         FACILITY:  [facility]   PHYSICIAN:  Ranelle Oyster, M.D.DATE OF BIRTH:  08/06/1921   DATE OF ADMISSION:  07/24/2004  DATE OF DISCHARGE:  08/05/2004                                 DISCHARGE SUMMARY   DISCHARGE DIAGNOSES:  1.  Right total knee replacement.  2.  Right femoral deep venous thrombosis.  3.  Chronic obstructive pulmonary disease.  4.  Atrial fibrillation.  5.  Hypertension.  6.  History of gout.   HISTORY OF PRESENT ILLNESS:  James [name] is an 77 year old male with history  of gout, DJD with right knee pain secondary to OA.  He elected to undergo  right total knee replacement on January 23, by Dr. Priscille Kluver.  Postop, he is  weightbearing as tolerated on Coumadin for DVT prophylaxis.  INR was  subtherapeutic at 1.5.  He has reported increased pain in right lower  extremity and bilateral extremity Dopplers done revealed right leg femoral  DVT.  As INR was subtherapeutic, he was started on treatment dose of Lovenox  for cross coverage.  He was noted to have some postop anemia requiring 2  units of packed red blood cells.  Therapy was initiated and the patient was  slow to progress along.  She is currently moderate assist for bed mobility,  maximum assist for transfers, moderate assist to ambulate 8 feet with  maximum cues.  Moderate to total assist for toileting.  SACU was consulted  for further therapies.   PAST MEDICAL HISTORY:  1.  Gout.  2.  Hiatal hernia.  3.  GERD.  4.  Atrial fibrillation.  5.  Hypertension.  6.  Right total hip replacement in 2004.  7.  Back surgery in 2004.  8.  L3-L4 microdissection.  9.  BPH.  10. Appendectomy.  11. Cholecystectomy.  12. COPD.  13. Peripheral edema.  14. PE.  15. Renal calculi.  16. History of gout right foot.  17. History of DVT in 1991.   ALLERGIES:   OXYCONTIN.   FAMILY HISTORY:  Positive for cancer.   SOCIAL HISTORY:  The patient is married and lives in one-level home with  ramp at entry.  Does not use any alcohol or tobacco.   HOSPITAL COURSE:  James [name] was admitted to subacute on July 24, 2004, for SACU level therapies to consist of PT/OT daily.  Passed admission,  Lovenox was continued throughout July 27, 2004.  Right knee incision was  noted to be healing well without any signs or symptoms of infection.  Labs  done passed admission reveal hemoglobin 10.1, hematocrit 30.5, white count  8.2, platelets 331.  Sodium was 136, potassium 3.7, chloride 102, CO2 28,  BUN 11, creatinine 0.1, glucose 119.  He was noted to have some mild  elevation in LFTs with SGOT 90, SGPT 56.  Pain control was reasonable with  p.r.n. use of Vicodin.  The patient is noted to have some erythema  of right  shin and low-dose Keflex was added on February 1, to continue x5 days.  He  was noted to have decrease in edema on February 6, with resolution.  Blood  pressures were noted to be labile.  Cardura was increased to 6 mg p.o.  q.h.s.  Heart rate has been stable.  Abnormal LFTs had resolved at the time  of discharge.   During his stay on subacute, James [name] progressed to being at supervision  level for transfers, supervision level for ambulating 150 feet with rolling  walker.  He is supervision level for bathing, minimal assist for lower body  dressing.  Further followup therapies to include home health PT/OT by  [facility] with home health to draw INR with results to  [facility].  On August 05, 2004, the patient is discharged to home.   DISCHARGE MEDICATIONS:  1.  Allopurinol 300 mg a day.  2.  Ultram 50 mg q.i.d.  3.  Theophylline 100 mg two p.o. b.i.d.  4.  Lopressor 50 mg b.i.d.  5.  Lasix 40 mg a day.  6.  Protonix 40 mg q.d.  7.  K-Dur 20 mEq a day.  8.  Cardura 4 mg 1-1/2 p.o. per day.  9.  Vicodin one  to two q.4-6h. p.r.n. pain.  10. Coumadin per home protocol 2.5 every day, but 5 mg on Monday and Friday.   ACTIVITY:  As tolerated with the use of walker.   DIET:  Regular.   WOUND CARE:  Wash with soap and water.  Keep area clean and dry.   SPECIAL INSTRUCTIONS:  No alcohol, no smoking, no driving.   FOLLOW UP:  The patient to follow up with Dr. Priscille Kluver for postop check in 2-  3 weeks.  Follow up with Dr. Juanetta Gosling for routine care.  Follow up with Dr.  Riley Kill as needed.      PP/MEDQ  D:  08/05/2004  T:  08/05/2004  Job:  [id number]   cc:   Ramon Dredge L. Juanetta Gosling, M.D.  [address]  [place]  [place] [address]  Fax: [phone]   Carlisle Beers. Rendall III, M.D.  [address] [place]  [place] [address]  Fax: [phone]   [facility]

## 2010-11-13 NOTE — Group Therapy Note (Signed)
NAME:  JORAN, KALLAL NO.:  [id number]   MEDICAL RECORD NO.:  [id number]          PATIENT TYPE:  INP   LOCATION:  A331                          FACILITY:  [facility]   PHYSICIAN:  Edward L. Juanetta Gosling, M.D.DATE OF BIRTH:  Sep 21, 1921   DATE OF PROCEDURE:  DATE OF DISCHARGE:                                 PROGRESS NOTE   Mr. Rockhill says he is feeling a little better.  He had some spontaneous  drainage of the area in his scrotum last night, but he is sitting up and  eating breakfast so I cannot examine it right now.  I will check it  later today.  Otherwise he says he is feeling all right.  Dr. Marvel Plan  help is appreciated.   EXAM:  Shows temperature is 98.6, pulse 73, respirations 20, blood  pressure 117/69, O2 sat 98%.  His chest is clear.  His heart is regular.  I did not examine his groin yet because he is sitting up and eating  breakfast, and requested that I am not do so now.   His prothrombin time is still quite elevated, so he is being postponed  as far as any sort of drainage procedures.   PLAN:  To continue with current meds, and treatments, and follow.  He  does not need Lovenox yet because he is still fully anticoagulated with  Coumadin.      Edward L. Juanetta Gosling, M.D.  Electronically Signed     ELH/MEDQ  D:  09/15/2006  T:  09/15/2006  Job:  [id number]

## 2010-11-13 NOTE — Op Note (Signed)
NAME:  James [name], James [name]                           ACCOUNT NO.:  [id number]   MEDICAL RECORD NO.:  [id number]                   PATIENT TYPE:  AMB   LOCATION:  DAY                                  FACILITY:  [facility]   PHYSICIAN:  R. Roetta Sessions, M.D.              DATE OF BIRTH:  September 09, 1921   DATE OF PROCEDURE:  12/26/2003  DATE OF DISCHARGE:                                 OPERATIVE REPORT   PROCEDURE:  Diagnostic colonoscopy.   INDICATIONS FOR PROCEDURE:  The patient is an 77 year old with intermittent  proctalgia and hematochezia in the setting of constipation.  He is on  Coumadin for atrial fibrillation.  Colonoscopy is now being done.  This  approach has been discussed with the patient at length.  The potential  risks, benefits and alternatives have been reviewed.  His Coumadin was  stopped four days ago, his INR is 2.1 today.  SP prophylaxis was provided  today in the way of ampicillin 2 gm IV, gentamicin 120 mg IV prior to the  procedure.   INSTRUMENT:  Olympus video system.   CONSCIOUS SEDATION:  1. Versed 3 mg IV.  2. Demerol 75 mg IV in divided doses.   FINDINGS:  Digital examination revealed no abnormalities aside from two pea-  sized lesions in the anal canal.   ENDOSCOPIC FINDINGS:  The prep was adequate.   RECTAL:  Examination of the rectal mucosa including a retroflexed view of  the anal verge revealed only two anal papilla and some hemorrhoids.  Otherwise, rectal mucosa appeared normal.   COLON:  Colonic mucosa was surveyed from the rectosigmoid junction to the  left transverse, right colon to the area of the appendiceal orifice,  ileocecal valve and cecum.  The obstructions were well seen, photographed  for the record, from this point the scope was slowly withdrawn.  All  previous __________ were again seen.  The patient was noted to have numerous  left-sided diverticula.  The remainder of the colonic mucosa appeared  normal.  The patient tolerated the procedure  well and was reactive to  endoscopy.   IMPRESSION:  Two anal papilla and internal hemorrhoids, otherwise normal  rectum.  Left-sided diverticula.  Remainder of colonic mucosa appeared  normal.   I suspect the patient's bleeding is from a hemorrhoidal origin.  An occult  anal fissure is not excluded.   RECOMMENDATIONS:  1. Daily Metamucil or Citrucel fiber supplement.  2. Diverticulosis, hemorrhoid literature provided to Mr. Gilbert.  Recommended     daily Metamucil or Citrucel fiber supplement, Colace 100 mg tablet once     daily.  3. Anusol-HC suppositories 1 per rectum at bedtime x10 days.  4. Patient is to resume his Coumadin today.  5. He is to let me know if rectal bleeding and/or artalgia does not resolve.      ___________________________________________  Jonathon Bellows, M.D.   RMR/MEDQ  D:  12/26/2003  T:  12/26/2003  Job:  [id number]   cc:   Ramon Dredge L. Juanetta Gosling, M.D.  [address]  [place]  [place] [address]  Fax: [phone]

## 2010-11-13 NOTE — Op Note (Signed)
NAME:  James [name], James [name] NO.:  [id number]   MEDICAL RECORD NO.:  [id number]          PATIENT TYPE:  INP   LOCATION:  A331                          FACILITY:  [facility]   PHYSICIAN:  Dennie Maizes, M.D.   DATE OF BIRTH:  25-Mar-1922   DATE OF PROCEDURE:  09/17/2006  DATE OF DISCHARGE:                               OPERATIVE REPORT   PREOPERATIVE DIAGNOSIS:  Infected sebaceous cyst of the scrotum/scrotal  abscess.   POSTOPERATIVE DIAGNOSIS:  Infected sebaceous cyst of the scrotum/scrotal  abscess.   OPERATIVE PROCEDURE:  Incision and drainage of scrotal abscess.   ANESTHESIA:  General.   SURGEON:  Dennie Maizes, M.D.   COMPLICATIONS:  None.   ESTIMATED BLOOD LOSS:  None.   DRAINS:  None.   INDICATIONS FOR PROCEDURE:  This 77 year old male was admitted to the  hospital with scrotal pain and swelling, suggestive of infected  sebaceous cyst of the scrotum.  She had spontaneous drainage of pus from  the abscess.  He was treated with IV antibiotics.  Post culture revealed  growth of methicillin-resistant Staphylococcus aureus.  The patient was  taken to the operating room today for incision and drainage of the  scrotal abscess.   DESCRIPTION OF THE PROCEDURE:  General anesthesia was induced and the  patient was placed on the OR table in the dorsolithotomy position.  Examination revealed a small scrotal abscess, about 3 x 2 cm in size, in  the posterior aspect of the left hemiscrotum.  The testes were normal.  The lower abdomen and genitalia were prepped and draped in a sterile  fashion.  An incision was made over the fluctuant part of the abscess.  About 3 mL of pus was drained.  The abscess cavity was then opened with  digital exploration.  Bleeding points were cauterized.  The abscess  cavity was then irrigated with dilute Betadine solution.  The abscess  cavity was packed with Iodoform gauze.  A dressing was applied and the  patient was transferred to the PACU  in a satisfactory condition.      Dennie Maizes, M.D.  Electronically Signed    SK/MEDQ  D:  09/17/2006  T:  09/17/2006  Job:  [id number]

## 2010-11-13 NOTE — Op Note (Signed)
NAME:  [name], James NO.:  [id number]   MEDICAL RECORD NO.:  [id number]          PATIENT TYPE:  INP   LOCATION:  1518                         FACILITY:  [facility]   PHYSICIAN:  John L. Rendall, M.D.  DATE OF BIRTH:  [birth year]   DATE OF PROCEDURE:  02/22/2006  DATE OF DISCHARGE:                                 OPERATIVE REPORT   PREOPERATIVE DIAGNOSIS:  Osteoarthritis, left knee.   SURGICAL PROCEDURES:  Left LCS total knee replacement with computer  navigation assistance.   POSTOPERATIVE DIAGNOSIS:  Osteoarthritis, left knee.   SURGEON:  John L. Rendall, M.D.   ASSISTANT:  Arnoldo Morale, [facility]   ANESTHESIA:  General.   PATHOLOGY:  The patient has bone on bone medially left knee and it is  disabling him from ambulation at this point.  He has a significant varus and  at the time of computer navigation 17 degrees of varus with the 10 degrees  flexion contracture is measured.   PROCEDURE:  Under general anesthesia the left leg was prepared with DuraPrep  and draped as sterile field.  A sterile tourniquet is used on proximal  thigh.  Leg is wrapped out with the Esmarch and the tourniquet is used at  350 mm.  Midline incision was made.  The patella was everted.  The femur was  sized to a large.  At this point the knee is debrided in preparation for  computer mapping.  Two Schanz pins were placed medially in the distal femur  and proximally in the medial tibia.  The arrays were set up.  The femoral  head was found.  The medial and lateral malleoli were found and mapping of  the tibia and femur are completed within 1 mm of anatomic accuracy and  repeatability.  Once this was completed, attention was turned to proximal  tibial resection.  Due to the superior bone loss medially 11 mm is taken  laterally and just a couple of millimeters of most medial portion.  The bone  was so soft laterally that this has to be cut and recut, cutting it  eventually a little lower than  normal but with excellent anatomic alignment.  Balancing was then done and varus and valgus were equalized to within 1  degree of normal longitudinal alignment.  The flexion gap was then measured  and balanced.  Attention was then turned to the femur and the anterior and  posterior flare of the distal femur are resected within 0 degrees of normal  rotation.  The distal femoral cut was then made and the flexion and  extension gaps were felt to be balanced at approximately 15 mm.  Attention  was then turned to the recessing guide and following this, remnants of the  cruciates and menisci were resected and bone fragments are removed here and  there and bone spurs were taken off the back of the femoral condyle.  Once  this was completed, the tibia was sized to a #5, center peg hole with keel  was inserted.  Trial of a 15 bearing and a large femur reveals  slight laxity  to varus valgus and slight hyperextension.  17.5 bearing brings the knee  from original 17 degrees of varus to 2 degrees of varus and full extension.  Schanz pin and arrays were then removed.  Permanent components were cemented  in place.  Once cement was hardened, the tourniquet is let down at an hour  and 25 minutes.  The multiple small vessels were cauterized, medium Hemovac  drain is inserted.  The knee is then closed in layers with #1 Tycron, #1-0  and #2-0 Vicryl and skin clips.  The patient returned to recovery in good  condition.      John L. Rendall, M.D.  Electronically Signed     JLR/MEDQ  D:  02/21/2006  T:  02/22/2006  Job:  [id number]

## 2010-11-13 NOTE — Op Note (Signed)
NAME:  James [name], James [name] NO.:  [id number]   MEDICAL RECORD NO.:  [id number]                   PATIENT TYPE:  INP   LOCATION:  3010                                 FACILITY:  [facility]   PHYSICIAN:  Donalee Citrin, M.D.                     DATE OF BIRTH:  1922-06-04   DATE OF PROCEDURE:  03/01/2003  DATE OF DISCHARGE:                                 OPERATIVE REPORT   PREOPERATIVE DIAGNOSIS:  L3 radiculopathy from an extraforaminal disc  rupture at L3-L4 on the right.   POSTOPERATIVE DIAGNOSIS:  L3 radiculopathy from an extraforaminal disc  rupture at L3-L4 on the right.   PROCEDURE:  Extraforaminal approach to an L3-L4 discectomy with microscopic  dissection of the right L3 nerve root and microdiscectomy.   SURGEON:  Donalee Citrin, M.D.   ASSISTANT:  Tia Alert, MD   ANESTHESIA:  General endotracheal anesthesia.   HISTORY OF PRESENT ILLNESS:  The patient is a very pleasant 77 year old male  who has had long-standing back and right leg pain radiating down the outside  of his thigh above his knee with numbness and tingling in the same  distribution.  The patient has been refractory to conservative treatment,  anti-inflammatories, and steroid injections.  I discussed the risks and  benefits of surgery with him and recommended an extraforaminal approach to  the L3-L4 microdiscectomy on the right to decompress the L3 nerve root after  preoperative imaging confirmed a lateral disc rupture at L3-L4.  The risks  and benefits were explained to the patient, he understood and agreed to  proceed forth.   DESCRIPTION OF PROCEDURE:  The patient was brought to the operating room,  given general anesthesia, positioned prone on the Wilson frame, the back was  prepped and draped in the usual sterile fashion.  Localization needle  confirmed the L2 disc space.  The midline incision was made just inferior to  this with the twin blade scalpel and Bovie electrocautery was  used to  dissect through the subcutaneous tissues and subperiosteal dissection was  carried out at the lamina of L3 and L4 exposing the TP of L3, localization x-  ray confirming the position of the probe at the L3, TP, and pedicle.  Then,  the lateral facet complexes were dissected out using a high speed drill.  The lateral facet complex at L3-L4 was drilled down and using 2 and 3 mm  Kerrison punch, the lateral aspect of the facet complex and lateral pars  just below the L3 TP was under bitten and then the soft tissue and muscle  was dissected free exposing the intertransverse ligament which was under  bitten with the 2 and 3 mm Kerrison punch.  The L3 nerve root was identified  and a blunt nerve hook was used to dissect off partially calcified disc  fragment underneath.  Then, with nerve  root reflected laterally, the disc  space was incised with the 11 blade scalpel.  Pituitary rongeurs were used  to enter the disc space.  The calcified bone was over bitten with a 2 and 3  mm Kerrison punch decompressing the disc space and several fragments of disc  were removed from the lateral compartment as well as the medial compartment  using pituitary rongeurs.  At the end of the discectomy, the nerve root was  noted to be widely decompressed and explored with an angled hockey stick and  coronary dilator both proximally and distally and no further stenosis was  appreciated.  Then, meticulous hemostasis was maintained.  The wound was  copiously irrigated.  Gelfoam was overlaid on top of the nerve root and then  the muscle and fascia were reapproximated with 0  interrupted Vicryl.  The subcutaneous tissue was closed with 2-0 interrupted  Vicryl.  The skin was closed with running subcuticular 4-0.  Benzoin and  Steri-Strips were applied.  The patient went to the recovery room in stable  condition.  At the end of the case, all needle, sponge, and instrument  counts were correct.                                                Donalee Citrin, M.D.    GC/MEDQ  D:  03/01/2003  T:  03/01/2003  Job:  [id number]

## 2010-11-13 NOTE — Procedures (Signed)
NAME:  MARKO, SKALSKI NO.:  [id number]   MEDICAL RECORD NO.:  [id number]                   PATIENT TYPE:  OUT   LOCATION:  RAD                                  FACILITY:  [facility]   PHYSICIAN:  Vida Roller, M.D.                DATE OF BIRTH:  1921-12-13   DATE OF PROCEDURE:  DATE OF DISCHARGE:                                  ECHOCARDIOGRAM   PRIMARY CARE PHYSICIAN:  Edward L. Juanetta Gosling, M.D.   TAPE NUMBER:  [id number], tape count [id number].   INDICATION:  This is a 77 year old man with chronic atrial fibrillation and  hypertension.   TECHNICAL QUALITY:  Limited secondary to the patient being in atrial  fibrillation and body habitus.   M-MODE TRACINGS:  1. The aorta is 39 mm.  2. The left atrium is 45 mm.  3. The septum is 24 mm.  4. The left ventricular posterior wall is 17 mm.  5. The left ventricular diastolic dimension is 30 mm.  6. The left ventricular systolic dimension is 24 mm.   2-D AND DOPPLER IMAGING:  1. The left ventricle is actually normal size.  2. The M-Mode measurements are around  large area of proximal septal     hypertrophy, but the overall systolic function is preserved at 50 to 55%.     There are no obvious wall-motion abnormalities.  Diastolic function     appears to be moderately impaired.  3. The right ventricle is normal size with normal systolic function.  4. Both atria are dilated.  There is no obvious atrial septal defect.  5. The aortic valve is sclerotic and has mild insufficiency.  No stenosis is     seen.  6. The mitral valve is thickened.  There is a moderate, eccentric jet of     mitral regurgitation.  7. The tricuspid valve has mild insufficiency.  8. The pulmonic valve was not well seen.  9. The pericardial structures appeared normal.  10.      The inferior vena cava appears to be normal size.  11.      The ascending aorta is not well seen.      ___________________________________________                                   Vida Roller, M.D.   JH/MEDQ  D:  09/16/2003  T:  09/17/2003  Job:  [id number]

## 2010-11-13 NOTE — Group Therapy Note (Signed)
NAME:  James [name], James [name] NO.:  [id number]   MEDICAL RECORD NO.:  [id number]          PATIENT TYPE:  INP   LOCATION:  A331                          FACILITY:  [facility]   PHYSICIAN:  Edward L. Juanetta Gosling, M.D.DATE OF BIRTH:  [birth year]   DATE OF PROCEDURE:  DATE OF DISCHARGE:                                 PROGRESS NOTE   Mr. Schwanke is doing okay with no new complaints.  He had a lot of  drainage from his groin yesterday, but this morning he has almost  another abscess formation.   His exam shows temperature is 97.8, pulse 65, respirations 20, blood  pressure 148/74.  His pro time  now 20.9 with an INR of 1.7, approaching  the point that we could safely drain it.  I attempted to discuss this  with Dr. Sophronia Simas today and see if maybe we can get him on the schedule  for tomorrow or alternatively for Monday the 24th.  I do not plan to  change anything else.      Edward L. Juanetta Gosling, M.D.  Electronically Signed     ELH/MEDQ  D:  09/16/2006  T:  09/16/2006  Job:  [id number]

## 2010-11-13 NOTE — Consult Note (Signed)
NAME:  James [name], CONSTANTE NO.:  [id number]   MEDICAL RECORD NO.:  [id number]          PATIENT TYPE:  INP   LOCATION:  1518                         [facility]ACILITY:  [facility]   PHYSICIAN:  Unice Cobble, MD     DATE OF BIRTH:  05-25-1922   DATE OF CONSULTATION:  02/21/2006  DATE OF DISCHARGE:                                   CONSULTATION   CARDIOLOGIST:  Farris Has. Dorethea Clan, M.D.   PRIMARY CARE PHYSICIAN:  Edward L. Juanetta Gosling, M.D.   REASON FOR CONSULTATION:  Postoperative hypotension after total knee  arthroplasty.   HISTORY OF PRESENT ILLNESS:  This is an 77 year old white male with a  history of atrial fibrillation which is rate controlled on Coumadin who had  a left total knee arthroplasty today.  The patient has had DVT's and  pulmonary embolisms perioperatively in the past.  Tonight at 1930, his blood  pressure dropped from the 110's systolic to 90/60.  By raising the head of  the bed, his blood pressure dropped to 76/53 and with bed lowering it  increased to 110/53.  His pulse was in the 70's to 80's the entire time.  The patient denies shortness of breath, chest pain, palpitations,  presyncope, PND, orthopnea, and was asymptomatic with clear mentation  throughout the entire process.  He does complain of some mild lower  extremity edema, but this is chronic in nature.   PAST MEDICAL HISTORY:  1. Atrial fibrillation - rate controlled on Coumadin.  2. Dobutamine stress Cardiolite in 2005 with normal LV function without      evidence of ischemia.  3. Chronic obstructive pulmonary disease.  4. History of PE and lower extremity DVT after hip surgery in 1996.  5. History of PE in 1991.  6. Degenerative joint disease.  7. Hypertension.  8. Hiatal hernia.  9. Gout.  10.History of heart murmur.  11.Bilateral lower extremity edema which is chronic.  12.Hemorrhoids.  13.Status post appendectomy.  14.Status post cholecystectomy.  15.Status post back surgery.  16.Status  post hip replacement on the right in 2004.  17.Circumcision in 2005.   ALLERGIES:  The patient has an allergy to OXYCONTIN which causes itching.   MEDICATIONS:  1. Lasix 40 mg daily.  2. Theophylline 200 mg b.i.d.  3. Diclofenac 75 mg t.i.d. p.r.n.  4. Misoprostol 200 mg t.i.d. p.r.n.  5. Doxazosin 4 mg daily.  6. Multivitamin.  7. Allopurinol 300 mg daily.  8. Coumadin 5 mg on Tuesday and Thursday and 2.5 mg on the other days.  9. Metoprolol 50 mg twice a day.  10.Omeprazole 20 mg daily.  11.Lisinopril 40 mg daily.  12.Kay Ciel 10 mEq daily.  13.Vitamin E daily.   SOCIAL HISTORY:  He lives in a one-story home with a ramp with his wife.  He  is a retired Visual merchandiser.  He has three children.  He does not smoke, drink, or  use drugs.  His exercise is limited by knee pain at this time.  No herbal  medicine use and relatively reasonable diet.   FAMILY HISTORY:  His mother  died in her 81's of breast cancer.  His father  died at the age of 26 of unknown causes.  His siblings have no evidence of  cardiac disease.   REVIEW OF SYSTEMS:  He has poor vision and joint swelling at the knees.  No  bright red blood per rectum or melena.  All other systems were reviewed in  total and found to be negative except as mentioned in the HPI.   PHYSICAL EXAMINATION:  VITAL SIGNS:  Temperature of 98.0 with a pulse of 70,  respiratory rate 18, blood pressure 96/57.  His O2 saturation is 100% on 2  liters.  GENERAL:  This is a pleasant white male in no acute distress, lying in bed.  HEENT:  PERRLA, EOMI, MMM.  Adequate dentition.  Normal oropharynx.  NECK:  Supple without lymphadenopathy, bruits, or jugular venous distention.  There is no thyromegaly.  No lymphadenopathy.  CHEST:  Lungs are clear to auscultation bilaterally.  HEART:  Irregular rate and rhythm with a 2/6 systolic ejection murmur best  heard at the left upper sternal border.  SKIN:  No rash or lesions.  ABDOMEN:  Faint bowel sounds.  It  is nontender and nondistended without  rebound or guarding.  EXTREMITIES:  Leg stockings bilaterally without edema.  He has a left knee  dressing with a wound Vac in place.  Neurologically, he is alert and  oriented x3 with grossly intact cranial nerves and grossly intact right  lower extremity nerves.   LABORATORY DATA:  His hemoglobin tonight is 10.1 down from 11.4 earlier  today.  Creatinine is 1.36 from February 18, 2006.  His INR is 1.7 at this  time.   ASSESSMENT:  This is an 77 year old white male with atrial fibrillation and  pulmonary embolism history with hypotension postoperatively.  This is likely  related to dehydration considering the orthostatic nature of his blood  pressures and his asymptomatic presentation.  I recommend fluid bolus of 1  liter normal saline overnight to rehydrate.  Check H&H in the a.m. for  dilutional drop excess.  I would cut his a.m. dose of lisinopril to 20 mg  daily.  Check EKG for ST changes.  Anticoagulate fully as soon as possible  considering DVT history.           ______________________________  Unice Cobble, MD     ACJ/MEDQ  D:  02/21/2006  T:  02/22/2006  Job:  [id number]

## 2010-11-13 NOTE — H&P (Signed)
NAME:  James [name], SCHALLER NO.:  [id number]   MEDICAL RECORD NO.:  [id number]          PATIENT TYPE:  INP   LOCATION:  A331                          FACILITY:  [facility]   PHYSICIAN:  Edward L. Juanetta Gosling, M.D.DATE OF BIRTH:  January 20, 1922   DATE OF ADMISSION:  09/13/2006  DATE OF DISCHARGE:  LH                              HISTORY & PHYSICAL   PROBLEM:  Scrotal abscess.   HISTORY:  James [name] is an 77 year old who came to my office this morning  with complaints of a lump in his groin.  He said that he had noticed it  about 3 days ago and at that time it was about the size he says of an  English pea.  Since then, it is grown, become inflamed and irritated,  and when he came into my office, he had about a 5 cm x 7-8 cm abscess on  his scrotum.  Because of the concerns with his age, etc. that he may get  a Fournier's gangrene, he is being admitted.  He is on Coumadin so he  could not be drained as an outpatient safely.  I have discussed his  situation Dr. Rito Ehrlich.  Dr. Rito Ehrlich has already seen him.   PAST MEDICAL HISTORY:  1. Hypertension.  2. Gout.  3. COPD.  4. History of pulmonary emboli almost 20 years ago.  5. Chronic bilateral lower extremity edema.  6. Atrial fibrillation.  7. Hemorrhoids.   He is going to be off Coumadin for a first short.  I am going to put him  on Lovenox in the meantime until Dr. Rito Ehrlich is going to do his  procedure if he needs it.  Dr. Rito Ehrlich said he would follow him up  tomorrow.  He did not think that he was going to need emergent surgery,  at least.  It appears to be mostly an infected sebaceous cyst.  I am  going to hold off on the Lovenox until tomorrow anyway.   MEDICATIONS AT HOME:  1. Simvastatin 40 mg daily.  2. Potassium 10 mEq daily.  3. Lisinopril 40 mg daily.  4. Theophylline 100 mg b.i.d.  5. Misoprostol 200 mg daily.  6. Diclofenac 75 mg daily.  7. Metoprolol 50 mg b.i.d.  8. Lasix 40 mg daily.  9. Allopurinol 300 mg  daily.  10.Prilosec 20 mg daily.  11.Cardura 8 mg daily.  12.MVI daily.  13.Coumadin 5 mg alternating with 2.5 mg.   PREVIOUS SURGICAL HISTORY:  1. Total knee arthroplasty of both knees.  2. Appendectomy.  3. Cholecystectomy.  4. Back surgery in 2004.  5. Hip arthroplasty in 2004.  6. Circumcision in 2005.   SOCIAL HISTORY:  He does not use tobacco or alcohol.  He is married and  lives at home with his wife.   FAMILY HISTORY:  His mother died at 54 with breast cancer with  metastatic disease to bone, father in his 65s of unknown cause.  Two  living brothers, both with severe osteoarthritis.  He did have a brother  who died of leukemia.   REVIEW OF SYSTEMS:  Other than as mentioned is essentially negative.   PHYSICAL EXAMINATION:  GENERAL:  A well-developed, well-nourished male  who does not appear to be in any acute distress right now.  VITAL SIGNS:  His temperature is 98, pulse 83, respirations 20, blood  pressure 153/65.  Height 56 inches, weight 200 pounds.  HEENT:  Pupils are reactive to light and accommodation.  He is wearing  glasses.  Nose and throat are clear.  He has hearing aids and hears  fairly well with them.  Mucous membranes are moist.  NECK:  Supple without masses, bruits, or jugular venous distension.  CHEST:  Rhonchi bilaterally.  HEART:  Irregular.  ABDOMEN:  Soft.  No masses are felt.  EXTREMITIES:  Trace edema bilaterally.  GU:  His scrotum shows a sebaceous cyst versus abscess that is draining  spontaneously little bit of pus.  CNS:  Grossly intact.   LABORATORY DATA:  His white blood count is 9700, hemoglobin 11.4,  platelets 211.  Electrolytes - BUN 24, creatinine 1.36, glucose of 162.  Liver function normal.  Pro time pending.   PLAN:  Will start him on antibiotics.  Dr. Rito Ehrlich has already seen  him, and his help is appreciated.  He will need to be on probably  Lovenox, but we can probably do that at a later date.  I will see if the   cardiologist feels that he needs that and will ask for consultation.      Edward L. Juanetta Gosling, M.D.  Electronically Signed     ELH/MEDQ  D:  09/13/2006  T:  09/14/2006  Job:  [id number]

## 2010-11-13 NOTE — Discharge Summary (Signed)
NAME:  KHOLTON, COATE NO.:  [id number]   MEDICAL RECORD NO.:  [id number]          PATIENT TYPE:  INP   LOCATION:  1518                         FACILITY:  [facility]   PHYSICIAN:  John L. Rendall, M.D.  DATE OF BIRTH:  [birth year]   DATE OF ADMISSION:  02/21/2006  DATE OF DISCHARGE:  02/25/2006                                 DISCHARGE SUMMARY   ADDENDUM:   HOSPITAL COURSE:  A bed was not available at a skilled facility on August 30  so he remained another day.  They are still searching for a bed at this  time.  Plans are hopeful for discharge later today.  We are going to  maintain his Foley today.  It can be discontinued over the weekend or Monday  or Tuesday per the skilled facility physicians.  Followup and all other  orders unchanged.      Legrand Pitts Duffy, P.A.      John L. Rendall, M.D.  Electronically Signed    KED/MEDQ  D:  02/25/2006  T:  02/25/2006  Job:  [id number]

## 2010-11-13 NOTE — Discharge Summary (Signed)
NAME:  James [name], James [name] NO.:  [id number]   MEDICAL RECORD NO.:  [id number]                   PATIENT TYPE:  INP   LOCATION:  5041                                 FACILITY:  [facility]   PHYSICIAN:  John L. Rendall, M.D.               DATE OF BIRTH:  [birth year]   DATE OF ADMISSION:  05/01/2003  DATE OF DISCHARGE:  05/06/2003                                 DISCHARGE SUMMARY   ADMISSION DIAGNOSES:  1. End-stage osteoarthritis, right hip.  2. End-stage osteoarthritis, bilateral knees, right worse than left.  3. Hypertension.  4. Hiatal hernia.  5. Heart murmur.  6. Gout.  7. Bilateral lower extremity edema.  8. Chronic obstructive pulmonary disease.  9. History of irregular heartbeat.  10.      History of pulmonary embolism in 1991.   DISCHARGE DIAGNOSES:  1. End-stage osteoarthritis, right hip, status post right total hip     arthroplasty.  2. Acute blood loss anemia secondary to surgery.  3. Probable chronic atrial fibrillation.  4. Acute renal insufficiency, now resolved.  5. Mild hypokalemia.  6. Constipation.  7. End-stage osteoarthritis bilateral knees, right worse than left.  8. Hypertension.  9. Hiatal hernia.  10.      Heart murmur.  11.      Gout.  12.      Bilateral lower extremity edema.  13.      Chronic obstructive pulmonary disease.  14.      History of irregular heartbeat.  15.      History of pulmonary emboli in 1991.   PROCEDURE:  On May 01, 2003, the patient underwent a right total hip  arthroplasty by Jonny Ruiz L. Rendall, M.D. and assisted by Legrand Pitts. Duffy, P.A.  They had a Pinnacle 300 series acetabular cup size 58 mm with an apex hole  eliminator and a Pinnacle Marathon acetabular liner +4 10 degree 58 mm outer  diameter 36 mm inner diameter. A hip stem with Redux groove Porocoat Prodigy  15 mm diameter small stature right and an Articulease metal-on-metal femoral  head 36 mm diameter +1.5 and 1214 cone.   COMPLICATIONS:   None.   CONSULTATIONS:  1. [facility] medicine consult on May 01, 2003.  2. Physical therapy and Rehab medicine consult on May 02, 2003.  3. Occupational therapy consult on May 03, 2003.   HISTORY OF PRESENT ILLNESS:  This 77 year old white male patient presented  to Dr. Priscille Kluver with a history of right hip fracture in February of 1996.  He  had pain in that hip since that time and subsequently had the hardware  removed in February of 2004.  Since that time, he continued to have right  hip pain and it has not gotten any better.  The pain at this point is  constant with any weightbearing and it seems to be located over the lateral  aspect of  the greater trochanter and down the side of the knee.  It  increases with any weightbearing and decreases as it gets off the leg.  The  pain has gotten progressively worse.  It is now requiring him to walk with a  walker and he has failed conservative treatment.  Because of that, he is  presenting for a right hip replacement.   HOSPITAL COURSE:  The patient tolerated his surgical procedure well without  immediate postoperative complications.  He was subsequently transferred to  5000.  The internal medicine consult was obtained immediately due to his  significant cardiac and medical history and they followed him throughout his  hospitalization.   On postoperative day #1, he had some confusion the night before, but it had  now cleared.  The pain was well controlled.  TMAX was 101.3, vitals were  stable.  Hemoglobin was 8.4 with hematocrit of 24.3 and his heart rate was  irregular.  He was subsequently transfused with two units of packed red  blood cells.  His leg was otherwise neurovascularly intact.  BUN had  increased to 14 with a creatinine of 1.4 and blood pressure medicines were  held.   On postoperative day #2, TMAX was 102.4, vitals were stable. Hemoglobin was  9.6, hematocrit 28.4.  he did have some decrease in his lung  bases, so  respiratory toilette was encouraged.  He was switched to p.o. pain  medicines.  He was started on Colchicine to help prevent any gout attacks.  His Foley was discontinued.   He continued to do well over the next several days.  His renal function  improved.  Hemoglobin stabilized on November 6, to 9.3 with hematocrit of  26.4.  On November 7, it then dropped to 8.6.  This was just monitored at  that time.  On May 06, 2003, he is doing well.  Pain was controlled with  medicines and his hemoglobin was 8.9 with hematocrit of 26.3, but he was  asymptomatic.  His blood pressure had increased to 162/100 and his blood  pressure medicines were restarted. Right hip incision was wall approximated  with staples. A bed became available for him on rehab at that time and he  was able to be transferred to rehab.   DISCHARGE INSTRUCTIONS:  1. Diet.  Continue his current hospitalization diet with rehab doctors to     adjust as needed.  2. Medications.  Continue current hospitalization medicines with rehab     doctors to adjust as needed.  3. Activity.  Can be out of bed weightbearing as tolerated on the right leg     with use of the walker.  PT and OT per rehab protocols.  4. Wound care.  Please clean the right hip incision with Betadine daily and     apply a dry dressing.  Staples can be removed with Steri-Strips with     Benzoin applied on postoperative day #14.  This can be done in rehab if     he is still there at that time.  Please notify Dr. Priscille Kluver if temperature     greater than 101.5, chills, pain unrelieved by pain medicines, or foul-     smelling drainage from the wound.   FOLLOW UP:  He needs to follow up with Dr. Priscille Kluver in our office on  postoperative day #14, if his staples are still intact when he is discharged  from rehab, otherwise, he can follow up with Dr. Priscille Kluver about a week or  two  after his discharge from rehab.  LABORATORY DATA:  Chest x-ray done on May 03, 2003, showed chronic  changes at the left lung base with interval development of atelectatic  change versus air space disease at the right base.  X-ray of his pelvis  taken on November 3, showed right total hip prosthesis in good position and  alignment.  On October 28, hemoglobin 12.6, hematocrit 36.3.  On November 4,  hemoglobin 8.4, hematocrit 24.3.  On November 5, hemoglobin 9.6, hematocrit  28.4, white count 13.7.  On November 7, white count 6.9, hemoglobin 8.6,  hematocrit 24.8.  On November 8, white count 6.8, hemoglobin 8.9, hematocrit  26.3, and platelets 262.  Glucose ranged from low of 103 on October 28, to a  high of 177 on November 4.  On November 8, glucose was 138.  Calcium ranged  from 8.1 on November 4, to 8.6 on November 8.  Sodium was 134 on November 5  and then potassium was 2.9 on November 7.  On November 8, sodium was 142,  potassium 3.7, chloride 106, CO2 29, glucose 138, BUN 11, creatinine 1.0,  and calcium 8.6.  On November 5, urinalysis showed moderate hemoglobin, 30  mg/dl of protein, 0 to 2 white cells, 3 to 6 red cells, no bacteria, and  negative nitrite and leukocyte esterase.  Urine culture done on November 7,  showed 8000 colonies per mL insignificant growth.  All other laboratory  studies were within normal limits.      Legrand Pitts Duffy, P.A.                      John L. Priscille Kluver, M.D.    KED/MEDQ  D:  05/27/2003  T:  05/27/2003  Job:  [id number]   cc:   Ramon Dredge L. Juanetta Gosling, M.D.  [address]  [place]  [place] [address]  Fax: [phone]

## 2010-11-13 NOTE — Op Note (Signed)
NAME:  AVID, GUILLETTE NO.:  [id number]   MEDICAL RECORD NO.:  [id number]                   PATIENT TYPE:  AMB   LOCATION:  NESC                                 FACILITY:  [facility]   PHYSICIAN:  Maretta Bees. Vonita Moss, M.D.             DATE OF BIRTH:  [birth year]   DATE OF PROCEDURE:  03/16/2004  DATE OF DISCHARGE:                                 OPERATIVE REPORT   PREOPERATIVE DIAGNOSIS:  Recurrent balanitis.   POSTOPERATIVE DIAGNOSIS:  Recurrent balanitis.   PROCEDURE:  Circumcision.   SURGEON:  Maretta Bees. Vonita Moss, M.D.   ANESTHESIA:  General.   INDICATIONS FOR PROCEDURE:  This 77 year old gentleman has had recurrent  balanitis and requests circumcision at this time to prevent recurrent  symptomatic episodes.  The patient requested general anesthesia.  He has  been off his Coumadin for four days.   DESCRIPTION OF PROCEDURE:  The patient was brought to the operating room and  placed in supine position, the external genitalia were prepped and draped in  the usual fashion.  A circumcision was performed using the sleeve technique.  Hemostasis was obtained with the use of electrocautery.  The frenular area  was closed with running 4-0 chromic catgut.  The distal edge of the penile  skin was reapproximated to the residual edge of the mucosal foreskin at 3,  6, 9, and 12 o'clock with 4-0 chromic catgut and 4-0 chromic catgut running  sutures were placed between these quadrant sutures.  Before I close the  circumcision with the chronic catgut, I did inject proximally with Marcaine  for postop analgesia.  The wound was cleaned and dressed with Bacitracin,  Vaseline gauze, dry, sterile gauze, and Coban.  He was taken to the recovery  room in good condition.  Sponge, needle, and instrument counts were correct.  Estimated blood loss essentially 0.  He tolerated the procedure well.      LJP/MEDQ  D:  03/16/2004  T:  03/16/2004  Job:  [id number]

## 2010-11-13 NOTE — Discharge Summary (Signed)
NAME:  James [name], MUSTIN NO.:  [id number]   MEDICAL RECORD NO.:  [id number]          PATIENT TYPE:  INP   LOCATION:  A331                          FACILITY:  [facility]   PHYSICIAN:  Edward L. Juanetta Gosling, M.D.DATE OF BIRTH:  February 07, 1922   DATE OF ADMISSION:  09/13/2006  DATE OF DISCHARGE:  03/24/2008LH                               DISCHARGE SUMMARY   DISCHARGE DIAGNOSES:  1. Infected sebaceous cyst of the scrotum versus scrotal abscess from      methicillin-resistant Staphylococcus aureus.  2. Chronic Coumadin therapy.  3. Hypertension.  4. Gout.  5. Chronic obstructive pulmonary disease.  6. History of pulmonary emboli.  7. Chronic bilateral lower extremity edema.  8. Atrial fibrillation.  9. Hemorrhoids.  10.Hyperlipidemia.   [name] is an 77 year old who came to my office on the morning of  admission with complaints of a lump in his groin.  He said about 3 days  ago he noticed a small area that has grown since.  By the time I saw him  in my office, he had a 5 x 7-8 cm abscess on his scrotum.  I was  concerned about the potential for Fournier gangrene.  He was admitted.  He has been on Coumadin and I discussed this situation with Dr. Rito Ehrlich  who suggested that he be brought into the hospital, taken off of his  Coumadin and then can have his evaluation and treatment.  His exam shows  a well-developed, well-nourished male using a walker.  He did not appear  to be in any acute distress.  He was afebrile at 98, pulse 83,  respirations 20, blood pressure 153/65, height was 56 inches.  Weight  was 200 pounds.  His heart was irregular.  His abdomen was soft.  Scrotum showed a sebaceous cyst versus abscess with draining pus  spontaneously.   His white blood count was 9700, hemoglobin was 11.4, platelets 211.  Creatinine 1.36, BUN 24.  Appeared to have mild chronic renal failure  and he had an elevated prothrombin time.   HOSPITAL COURSE:  He was started on  antibiotics.  His Coumadin was held.  I asked Dr. Dietrich Pates to see him and Dr. Dietrich Pates felt that he did not  need to have been bridging of his Coumadin with Lovenox.  His  prothrombin time eventually came down far enough for him to safely have  surgery and this was done on the 22nd.  He had a PICC line placed on the  24 with anticipation of starting on vancomycin and he is discharged home  to continue vancomycin as an outpatient for 10 more days intravenously.  He is going to continue with his other medications.  Simvastatin 40 mg  daily, potassium 10 mEq daily, lisinopril 40 mg daily, theophylline 100  mg b.i.d., misoprostol 200 mg daily,  diclofenac 75 mg daily, metoprolol 50 mg b.i.d., Lasix 40 mg daily,  allopurinol 300 mg daily, Prilosec 20 mg daily, Cardura 8 mg daily,  multivitamin daily and Coumadin 2.5 alternating with 5.  Prothrombin  time checked in the next 4-5 days and  he will follow up in my office.      Edward L. Juanetta Gosling, M.D.  Electronically Signed     ELH/MEDQ  D:  09/19/2006  T:  09/20/2006  Job:  [id number]

## 2010-11-13 NOTE — Discharge Summary (Signed)
NAME:  James [name], James [name] NO.:  [id number]   MEDICAL RECORD NO.:  [id number]                   PATIENT TYPE:  IPS   LOCATION:  4140                                 FACILITY:  [facility]   PHYSICIAN:  Erick Colace, M.D.           DATE OF BIRTH:  1921/08/13   DATE OF ADMISSION:  05/06/2003  DATE OF DISCHARGE:  05/13/2003                                 DISCHARGE SUMMARY   DISCHARGE DIAGNOSES:  1. Right total hip replacement, May 01, 2003.  2. Anemia.  3. Postoperative hypotension, resolved.  4. Arixtra for deep venous thrombosis prophylaxis.  5. Pain management.  6. Gout.  7. Gastroesophageal reflux disease.  8. History of pulmonary emboli.   HISTORY OF PRESENT ILLNESS:  This is a 77 year old white male with a history  of a right hip fracture in 1996 with compression screw, received inpatient  rehab services, admitted May 01, 2003 with persistent right hip pain x8  months, x-rays with advanced osteoarthritis.  He underwent complex right  total hip replacement, May 01, 2003, per Dr. Jonny Ruiz L. Rendall, placed on  Arixtra for deep venous thrombosis prophylaxis and weightbearing as  tolerated; postoperative anemia, 8.3, and transfused; bouts of hypotension  with Altace and Cardura held, Cardura was resumed May 05, 2003; bout of  hypokalemia of 2.9 and supplemented.  Minimal assistance for transfers,  admitted for a comprehensive rehab program.   PAST MEDICAL HISTORY:  See discharge diagnoses.   PAST SURGICAL HISTORY:  1. A right hip fracture in 1996.  2. Cholecystectomy.  3. Appendectomy.  4. Back surgery.  5. Cataract surgery.   ALLERGIES:  OXYCODONE.   PRIMARY MEDICAL DOCTOR:  Dr.  Ramon Dredge L. Hawkins at [phone].   MEDICATIONS PRIOR TO ADMISSION:  Allopurinol, Altace, Prilosec, Lasix,  Cardura, Lopressor and Arthrotec.   HABITS:  Denies alcohol or tobacco.   SOCIAL HISTORY:  Lives with wife in [place], independent with a  walker  prior to admission, one-level home with a ramp.  Wife and two local sons can  assist.   HOSPITAL COURSE:  Patient with progressive gains while in rehab services,  with therapies administered on a b.i.d. basis.  The following issues were  followed during patient's rehab course:  Pertaining to Mr. Pecina' right  total hip replacement, surgical site healing nicely.  Staples had been  removed, no signs of infection, weightbearing as tolerated with hip  precautions.  The patient would follow up with Dr. Priscille Kluver.  Home health  therapies had been provided by [place].  He completed his course of Arixtra  for deep venous thrombosis prophylaxis.  Postoperative anemia was stable  with latest hemoglobin 8.9, hematocrit 26, no bleeding episodes, he  continued on iron supplement.  His blood pressure medications were slowly  resumed after initial bout of hypotension; he will continue on his Altace,  Cardura and Lopressor and follow up with Dr. Shaune Pollack  Pain management  ongoing with the use of Vicodin and good results.  He had no nausea or  vomiting, no bowel or bladder disturbances throughout his rehab course.  He  was ambulating extended household distances with a walker, essentially  independent to standby assist in all areas of activities of daily living of  dressing, grooming and homemaking.   Latest labs showed a hemoglobin of 8.9, hematocrit 26, platelets 310,000;  sodium 138, potassium 3.4, BUN 9, creatinine 1.0.   DISCHARGE MEDICATIONS:  1. Trinsicon one capsule twice daily.  2. Protonix 40 mg daily.  3. Cardura 4 mg at bedtime.  4. Lopressor 50 mg twice daily.  5. Lasix 40 mg daily.  6. Allopurinol 300 mg daily.  7. Potassium chloride 20 mEq daily.  8. Altace 5 mg twice daily.  9. Vicodin as needed -- pain.   ACTIVITY:  Activity was weightbearing as tolerated with hip precautions.   DIET:  Diet was regular.   WOUND CARE:  Cleanse incision daily with warm water and  soap.   SPECIAL INSTRUCTIONS:  No driving.  Home health physical and occupational  therapy per [place].   FOLLOWUP:  He should follow up with Dr. Priscille Kluver, [phone], orthopedic  services, Dr. Shaune Pollack as advised.      Mariam Dollar, P.A.                     Erick Colace, M.D.    DA/MEDQ  D:  05/13/2003  T:  05/13/2003  Job:  [id number]   cc:   Erick Colace, M.D.  [address] [place]  [place] [address]  Fax: [phone]   Carlisle Beers. Rendall III, M.D.  [address] [place]  [place] [address]  Fax: [phone]   Oneal Deputy. Juanetta Gosling, M.D.  [address]  [place]  [place] [address]  Fax: [phone]

## 2010-11-13 NOTE — Consult Note (Signed)
NAME:  James [name], James [name] NO.:  [id number]   MEDICAL RECORD NO.:  [id number]          PATIENT TYPE:  INP   LOCATION:  A331                          FACILITY:  [facility]   PHYSICIAN:  Dennie Maizes, M.D.   DATE OF BIRTH:  02-Sep-1921   DATE OF CONSULTATION:  09/13/2006  DATE OF DISCHARGE:                                 CONSULTATION   REASON FOR CONSULTATION:  Possible scrotal abscess.   HISTORY OF PRESENT ILLNESS:  This 77 year old male experienced pain over  the left hemiscrotum and he felt a swelling in the scrotum.  The  swelling has been present for the past three days.  The swelling has  been increasing in size and he started having some drainage of pus from  the scrotum.  He was seen by Dr. Ramon Dredge L. Hawkins in the office.  He  has been admitted to hospital for management of a possible scrotal  abscess.  I was asked to  see the patient in consultation.   The patient denied having any voiding difficulty, chills or flank pain.  He has a history of postoperative urinary retention after undergoing a  left knee replacement at [facility] in August 2007.  There is no history  of fever, chills or hematuria at present.   PAST MEDICAL/SURGICAL HISTORY:  1. Hypertension.  2. Chronic atrial fibrillation, on anticoagulant therapy.  3. History of DVT with pulmonary emboli.  4. Chronic obstructive pulmonary disease.  5. Hiatal hernia.  6. Gout.  7. Chronic lower extremity edema.  8. Status post left knee replacement.   CURRENT MEDICATIONS:  1. Simvastatin 40 mg one p.o. daily.  2. Potassium 10 mEq one p.o. daily.  3. Lisinopril 40 mg one p.o. daily.  4. Theophylline 100 mg one p.o. b.i.d.  5. Misoprostol 200 mg one p.o. daily.  6. Diclofenac 75 mg one p.o. daily.  7. Metoprolol 50 mg one p.o. b.i.d.  8. Lasix 40 mg one p.o. daily.  9. Allopurinol 110 mg one p.o. daily.  10.Prilosec 20 mg one p.o. daily.  11.Cardura 8 mg one p.o. daily.  12.Multivitamin one p.o.  daily.  13.Coumadin 5 mg and 2.5 mg p.o. on alternate days.   ALLERGIES:  OXYCONTIN AND SUPRAX.   PHYSICAL EXAMINATION:  ABDOMEN:  Soft, no palpable flank mass.  No  costovertebral angle tenderness.  Bladder is not palpable.  GENITOURINARY:  Penis normal.  Testes are normal on both sides.  There  is a mass in the left hemiscrotum in the subcutaneous area measuring  about 5 mm x 4 mm in size.  There is a draining sinus from the mass.  There is discharge of pus from the mass.  Clinically this is suggestive  of an infected sebaceous cyst.  The mass is firm in consistency and is  not fluctuant at present.  RECTAL:  Rectal examination not done   LABORATORY DATA:  Admission labs reveal the electrolytes within normal  range.  Glucose 162, BUN 24, creatinine 1.36.  CBC:  WBC 9.7, hemoglobin  11.4, hematocrit 34.9.  Urine culture and sensitivity and blood culture  and sensitivity are pending.  Urinalysis is still pending.   IMPRESSION:  Infected sebaceous cyst of the scrotum.   PLAN:  1. The patient is on IV Rocephin.  Will continue antibiotics until the      culture results  are known.  2. Pus for scrotal culture and sensitivity.  3. PT and PTT in a.m.  4. We will stop the Coumadin for possible incision and drainage of the      scrotal abscess.   Thanks for this consultation.      Dennie Maizes, M.D.  Electronically Signed     SK/MEDQ  D:  09/13/2006  T:  09/14/2006  Job:  [id number]

## 2010-11-13 NOTE — Group Therapy Note (Signed)
NAME:  ADISA, LITT NO.:  [id number]   MEDICAL RECORD NO.:  [id number]          PATIENT TYPE:  INP   LOCATION:  A331                          FACILITY:  [facility]   PHYSICIAN:  Edward L. Juanetta Gosling, M.D.DATE OF BIRTH:  04-Jun-1922   DATE OF PROCEDURE:  DATE OF DISCHARGE:                                 PROGRESS NOTE   The patient  has had his surgery for incision and drainage of his  scrotal abscess and seems to be doing okay.  He does have MRSA in the  abscess.  He says he is feeling okay except he is getting constipated.   His exam shows temperature is 98, pulse 81, respirations 20, blood  pressure 164/103.  His abdomen soft.  His chest is clear.  His heart is  regular.  Extremities showed no edema.   ASSESSMENT:  He seems to be doing better.   PLAN:  He is going to continue vancomycin. I am going to start him back  on his Coumadin.  He wants to take GlycoLax which has been on at home  and I will go ahead and order that.  Otherwise I am going to try to work  out getting him treated at home for his scrotal abscess with vancomycin  after he is improved a bit.      Edward L. Juanetta Gosling, M.D.  Electronically Signed     ELH/MEDQ  D:  09/17/2006  T:  09/17/2006  Job:  [id number]

## 2010-11-13 NOTE — Op Note (Signed)
   NAME:  James [name], James [name]                           ACCOUNT NO.:  [id number]   MEDICAL RECORD NO.:  [id number]                   PATIENT TYPE:  INP   LOCATION:  2550                                 FACILITY:  [facility]   PHYSICIAN:  John L. Rendall III, M.D.           DATE OF BIRTH:  [birth year]   DATE OF PROCEDURE:  08/06/2002  DATE OF DISCHARGE:                                 OPERATIVE REPORT   INDICATIONS FOR PROCEDURE:  Right hip pain.   PREOPERATIVE DIAGNOSES:  Painful compression hip screw device, status post  healed intertrochanteric hip fracture, right.   PROCEDURE:  Removal of Ace cannulated compression hip screw, right.   POSTOPERATIVE DIAGNOSES:  Painful compression hip screw device, status post  healed intertrochanteric hip fracture, right.   SURGEON:  John L. Rendall, M.D.   ASSISTANT:  Madilyn Fireman, P.A.-C.   ANESTHESIA:  General.   PATHOLOGY:  The patient is eight years status post Ace cannulated  compression screw, right hip, and is relatively skinny.  He has tenderness  along the lateral femoral cortex where the plate is protruding.  He has  failed to receive relief with a cortisone injection.   DESCRIPTION OF PROCEDURE:  Under general anesthesia, the patient was placed  in the left lateral decubitus position.  The right hip is prepared with  DuraPrep and draped as a sterile field.  The previous surgical incision is  reopened.  The IT band is split in the line of its fibers.  The vastus  lateralis is released slightly posteriorly, exposing the metal of the Ace  cannulated side plate.  The screws are backed out and the side plate is  removed, leaving the sleeve and the large screw up the femoral neck.  The  special screwdriver for this is obtained, and the large hip screw is removed  with moderate difficulty, as it was in very hard ball.  Once all the  apparatus is removed, there is some metal rust reaction noted even around  where the titanium screws had been in the  plate.  This was all debrided.  Once this was completed, the vastus lateralis is closed over the lateral  femur with a running #0 Vicryl stitch.  The IT band is closed with #0 Vicryl  stitch, the subcutaneous with #2-0 Vicryl, and the skin with clips.  The  operative time was less than one hour.  The patient tolerated the procedure well and returned to the recovery room  in good condition.                                                John L. Dorothyann Gibbs, M.D.    Renato Gails  D:  08/06/2002  T:  08/06/2002  Job:  [id number]

## 2010-11-13 NOTE — Letter (Signed)
February 08, 2006     James [name], M.D.  [address]  [place], [place]  [address]   RE:  James, [name]  MRN:  [id number]  /  DOB:  Nov 24, 1921   Dear Dr. Priscille [name],   I understand that you are contemplating doing a knee replacement surgery on  my patient, James [name].  You kindly sent him to me for perioperative  assessment and I would like to provide that for you and for him.  James [name]  is a patient that I follow who has atrial fibrillation as his primary  cardiac issue.  He had a Dobutamine stress Cardiolite done in 2005, which  showed no significant ischemia and preserved LV systolic function with an  ejection fraction of 66%.  He has severe COPD from what we think is dust  exposure.  He has lung disease, but is able to tolerate a beta blocker.  He  is managed on the following medications:  Lasix 40 mg once a day,  theophylline 200 mg b.i.d., Diclofenac 75 mg t.i.d. as needed, doxazosin 4  mg once a day, multivitamin once a day, _______________200 mg t.i.d. p.r.n.  when he takes his Diclofenac, allopurinol 300 mg a day, Coumadin as directed  by our [facility], metoprolol 50 mg b.i.d., Omeprazole 20 mg once a  day, Lisinopril 40 mg once a day, potassium 10 mEq once a day, and he is on  a vitamin for his vision.  He is without any significant symptoms.  No chest  pain, no shortness of breath.  He is limited somewhat in his ambulatory  ability by his lower extremity degenerative joint disease.  Of note, in his  past medical history, is a history of pulmonary embolus and lower extremity  DVT that occurred after a hip surgery in 1996.  He had a pulmonary embolus  prior to that in 1991, so he is at moderate risk for embolic disease, and  this should be made aware to you as you consider how to manage his  postoperative and perioperative Coumadin.   PHYSICAL EXAMINATION:  VITAL SIGNS:  He is 194 pounds which is down about  six pounds from previous.  His blood  pressure is 104/60.  His pulse is 70  and irregular.  CHEST:  Decreased breath sounds at the bases, but otherwise normal air  movement, and slightly increased expiratory phase.  CARDIOVASCULAR:  Irregular.  Normal first and second heart sounds.  I do not  hear a murmur.  His lower extremities have just trace edema.   LABORATORY DATA:  Electrocardiogram performed in my office today shows  atrial fibrillation at a ventricular rate of 70, narrow QRS at 80, no  ischemic ST-T wave changes, and there are no T-waves concerning for an old  myocardial infarction.  QRS duration is 80.  QT corrected is 432.  His INR  today is pending.   ASSESSMENT:  1. Atrial fibrillation.  Rate is well controlled.  He is adequately      anticoagulated.  2. History of pulmonary emboli and lower extremity deep vein thrombosis.      I think is relatively substantial and ought to be considered as a      patient to be done under Coumadin therapy.  I know that you are very      comfortable using Coumadin perioperatively in your patients for deep      venous thrombosis prophylaxis.  This is a gentleman I think is  at      extremely high risk for recurrent deep vein thrombosis and potential      pulmonary embolus.  With regard to his lung disease, I will have to      defer to his primary care James [name], Dr. Juanetta [name], who is a      pulmonologist, to determine whether or not he has any perioperative      concerns regarding that.  It might be very worthwhile for him to see      Dr. Juanetta [name] prior to his surgery.  With regard to his atrial      fibrillation, his rate is well controlled.  Beta blocker seems very      reasonable to continue perioperatively as it imparts significant      positive short and long-term outlook for perioperative patients.  His      hypertension is also well controlled on medications, and it is likely      that he will do well with that.  So, I have no recommendations other      than to continue his  beta blockers perioperatively.  I would encourage      you, however, to contact Dr. Juanetta [name] directly for recommendations      regarding his pulmonary status.    Sincerely,      James Has. Dorethea Clan, MD   JMH/MedQ  DD:  02/08/2006  DT:  02/08/2006  Job #:  [id number]   CC:    James [name]. James Gosling, MD

## 2010-11-13 NOTE — Op Note (Signed)
NAME:  James [name], James [name] NO.:  [id number]   MEDICAL RECORD NO.:  [id number]          PATIENT TYPE:  INP   LOCATION:  0002                         FACILITY:  [facility]   PHYSICIAN:  John L. Rendall III, M.D.DATE OF BIRTH:  03/02/1922   DATE OF PROCEDURE:  07/20/2004  DATE OF DISCHARGE:                                 OPERATIVE REPORT   PREOPERATIVE DIAGNOSIS:  End stage osteoarthritis, right knee.   POSTOPERATIVE DIAGNOSIS:  End stage osteoarthritis, right knee.   OPERATION PERFORMED:  Computer navigation assisted right LCS total knee  replacement.   SURGEON:  John L. Rendall, M.D.   ASSISTANT:  Legrand Pitts. Duffy, P.A.   ANESTHESIA:  General.   PATHOLOGY:  The patient has longstanding history of an arthritic knee now  with chronic severe pain.  At the start of computer navigation, he was  measured to have an 11 degree varus deformity from the anatomic axis and a  14 degree fixed flexion contracture.   DESCRIPTION OF PROCEDURE:  Under general anesthesia, the right leg was  prepared as DuraPrep and draped as a sterile field.  The leg was wrapped out  with the Esmarch and the sterile tourniquet was elevated at 350 mmHg.  A  midline incision was made, the patella was everted, the femur was sized to a  large.  Debridement was done for computer mapping.  Shantz pins were then  inserted in the medial tibial metaphysis and midportion of the tibia.  Once  computer navigation and mapping were done, the tibial guide was then used  for the proximal tibial resection.  This was done within one degree of  anatomic axis.  Once this was completed, the tensioner was inserted and  major release was performed around the medial tibial plateau extending into  the posterior medial tibia and posterior aspect of the femoral condyle.  Once this was completed, the knee was corrected to 0 degrees of varus and  full extension was obtained.  At this point, cuts were done on the femur  also with  computer navigation.  First the anterior and posterior cuts and  then the distal femoral cut again checking them to get them within a degree  of anatomical axis.  Once these were completed, recessing guide was used.  The tibia was then sized at a #5 center peg hole with keel was inserted.  Trial reduction then of a #4 tibia.  A 12.5 bearing and a large femur  reveals excellent fit, alignment and stability; however, in the process of  testing, there seemed to be a little laxity develop.  Permanent components  were obtained of the same size and after preparation of the bony surfaces,  they were cemented in place.  However, at this point the tibial insert  bearing appeared to be too loose and we had to waste the 12.5 mm bearing and  insert a 15 bearing.  The patella was osteotomized and three peg holes were  done in the routine manner prior to cementing in all  components.  Once the cement had hardened, excess was removed.  The  tourniquet was let down at an hour and 15 minutes.  The knee was then closed  in layers after approximately electrocautery and debridement of cement.  Suture used was #1 Tycron and 2-0 Vicryl and skin clips.  Total operative  time approximately an hour and 30 minutes.      JLR/MEDQ  D:  07/20/2004  T:  07/20/2004  Job:  [id number]

## 2010-11-13 NOTE — Group Therapy Note (Signed)
NAME:  James [name], James [name]                 ACCOUNT NO.:  [id number]   MEDICAL RECORD NO.:  [id number]           PATIENT TYPE:  INP   LOCATION:  A331                          FACILITY:  [facility]   PHYSICIAN:  Edward L. Juanetta Gosling, M.D.DATE OF BIRTH:  1921-07-18   DATE OF PROCEDURE:  DATE OF DISCHARGE:                                 PROGRESS NOTE   PROGRESS NOTE   PROBLEMS:  MRSA,  scrotal abscess.   SUBJECTIVE:  James [name] says he is doing okay.  He has no other new  complaints.  He has no new problems.  He does of course also have COPD.  He has coronary disease.  He has severe osteoarthritis with multiple  joint surgeries and replacements.  He is not having any problems  breathing.  He has had no chest pain, and he feels fairly well.   PHYSICAL EXAMINATION:  VITAL SIGNS:  His physical exam shows his  temperature is 98, pulse is 69, respirations 20, blood pressure 144/82.  SCROTAL EXAM:  I did not examine his scrotum today since he is postop.   ASSESSMENT:  He seems to be doing well.  Plan is to have him get a PICC  line tomorrow.  We are working on trying to arrange discharge and  possible IV antibiotics at home.      Edward L. Juanetta Gosling, M.D.  Electronically Signed     ELH/MEDQ  D:  09/18/2006  T:  09/18/2006  Job:  [id number]

## 2010-11-13 NOTE — Discharge Summary (Signed)
NAME:  James [name], LEINER NO.:  [id number]   MEDICAL RECORD NO.:  [id number]          PATIENT TYPE:  INP   LOCATION:  1518                         FACILITY:  [facility]   PHYSICIAN:  John L. Rendall, M.D.  DATE OF BIRTH:  06-Mar-1922   DATE OF ADMISSION:  02/21/2006  DATE OF DISCHARGE:  02/24/2006                                 DISCHARGE SUMMARY   ADMISSION DIAGNOSES:  1. End-stage osteoarthritis, left knee.  2. Hypertension.  3. Atrial fibrillation, on chronic Coumadin.  4. History of deep venous thromboses multiple times with pulmonary emboli.  5. Chronic obstructive pulmonary disease.  6. Hiatal hernia.  7. Gout.  8. Chronic lower extremity edema.   DISCHARGE DIAGNOSES:  1. End-stage osteoarthritis, left knee, status post left total knee      arthroplasty.  2. Acute blood loss anemia secondary to surgery.  3. Poor pain control, now resolved.  4. Urinary retention.  5. Hypertension.  6. Atrial fibrillation.  7. History of multiple deep venous thromboses and pulmonary emboli in the      past.  8. Chronic obstructive pulmonary disease.  9. Hiatal hernia.  10.Gout.  11.Chronic lower extremity edema.   SURGICAL PROCEDURE:  On February 21, 2006, Mr. Bejar underwent a left total  knee arthroplasty with computer navigation by Dr. Jonny Ruiz L. Rendall, assisted  by Legrand Pitts. Duffy, P.A.-C.  He had a DePuy LCS complete primary femoral  component, cemented, size large left, placed with a DePuy MBT keeled tibial  tray, cemented, size 5.  An LCS complete RP insert, size large, 17.5-mm  thickness, with an LCS complete metal-backed patella, cemented, size large.   COMPLICATIONS:  None.   CONSULTS:  1. Pharmacy consult for Coumadin and Arixtra therapy, February 21, 2006, in      addition to a cardiology consult by [facility] due to his      atrial fibrillation and hypotension postop.  2. Physical Therapy consult, February 22, 2006.  3. Occupational Therapy consult, February 23, 2006.   HISTORY OF PRESENT ILLNESS:  This 77 year old white male presented to Dr.  Priscille Kluver with a history of progressive deformity of that left knee.  He has  significant pain in that left knee that is a mild aching at times with  swelling, which gets worse.  It awakens him at night.  He has significant  deformity of the knee and he has failed conservative treatment.  X-rays show  end-stage arthritic changes of the knee and because of this, he is  presenting for a left knee replacement.   HOSPITAL COURSE:  Mr. Seelman tolerated his surgical procedure well without  immediate postoperative complications.  He was transferred to [facility] [place].  On  hospital night #1, he did have some hypotension, which we felt was due to  medications, but with his history of atrial fibrillation, a cardiology  consult by [facility] was obtained.  They recommended rehydration  and adjustments of medications and to monitor him.  They did monitor him the  rest of his hospitalization.  On postop day #1, T-max 98.6; vitals  were  stable.  Hemoglobin was 8.6, hematocrit 25.6.  Leg was neurovascular intact.  He was started on Arixtra until his Coumadin was therapeutic initially at  prophylactic dose and then was increased to low therapeutic dose on postop  day #2.  He was transfused with 1 unit of blood initially and then with a  hemoglobin still low, a second unit of blood on the 28th; he tolerated that  well.   On postop day #2, T-max 101; vitals were stable.  Hemoglobin improved to 9.3  with hematocrit of 27.5.  INR had increased to 2.5.  Left knee incision was  well-approximated with staples with mild drainage.  He was switched to p.o.  pain medicines and taken off the PCA.  He was switched from Vicodin to  Percocet due to lack of good pain control.  That day was his first dose of  low-treatment dose Arixtra, which was 5 mg, and he was kept on the Coumadin.   He did have difficulty with voiding that day and  require 2 I&O caths.  On  postop day #3, he is still having difficulty voiding, so a Foley will be  placed.  T-max is 99.9, vitals stable, heart rate 96.  Hemoglobin is 9.2,  hematocrit 27.2.  INR is 2.8.  Leg is neurovascularly intact.  He has not  had a bowel movement, which will be treated with a laxative.  It is felt he  is stable by both Cardiology and Orthopedics for transfer to a skilled  facility.  He will be transferred there hopefully later today.   DISCHARGE INSTRUCTIONS:   DIET:  He can continue a regular diet.   MEDICATIONS:  1. Coumadin every 6 p.m.; at home, he had been on 5 mg p.o. every Tuesday      and Thursday and 2.5 mg the other days.  Last dose on the 29th was 2.5      mg at 6 p.m.  We need to keep the INR between 2 and 3.  2. Colace 100 mg p.o. b.i.d.  3. Senokot one tablet p.o. b.i.d. a.c.  4. Allopurinol 300 mg p.o. q.a.m.  5. Cardura 4 mg p.o. q.a.m.  6. Lasix 40 mg p.o. q.a.m.  7. Lopressor 50 mg p.o. b.i.d.  8. Omeprazole 20 mg p.o. q.a.m.  9. K-Dur 10 mEq p.o. q.a.m.  10.Theo-Dur 200 mg p.o. q.12 h.  11.MiraLax 17 g p.o. nightly.  12.Anusol HC 25 mg PR nightly.  13.Lisinopril 20 mg p.o. q.a.m.  14.Percocet one to two tablets p.o. q.4 h. p.r.n. for pain.  15.Robaxin 500 mg to 1000 mg p.o. q.6 h. p.r.n. for spasms.  16.Ventolin nebulizers 2.5 mg/3 mL solution inhaled q.6 h. p.r.n.      difficulty breathing.  17.Benadryl 25 mg to 50 mg p.o. q.6 h. p.r.n. for itching.   Additional home medication that he was on before was Arthrotec 75 mg p.o.  t.i.d., which we will hold at this time.   ACTIVITY:  He can be out of bed weightbearing as tolerated on the left leg  with the use of a walker.  He is to have home CPM 0-100 degrees 6-8 hours a  day and home health PT per total knee rehab protocols.   WOUND CARE:  Please clean the left knee incision with Betadine daily and apply a dry dressing.  Continue TEDs on that leg to help decrease swelling.  Please  notify Dr. Priscille Kluver of temperature greater than 101.5, chills, pain  unrelieved  by pain medications, foul-smelling drainage from the wounds.   FOLLOWUP:  He needs to follow up with Dr. Priscille Kluver in our office on Tuesday,  March 08, 2006, and needs to call [phone] for that appointment.   SPECIAL DISCHARGE INSTRUCTIONS:  A Foley is being placed on February 24, 2006.  It can be discontinued on February 26, 2006 or the 2nd when he is able to  move a little bit better to see if he can void regularly at that time.   RADIOLOGIC FINDINGS:  Chest x-ray done on February 18, 2006 showed heart size  at upper limits of normal, chronic left basilar or pleural parenchymal  scarring without change and no active disease.   LABORATORY DATA:  Hemoglobin and hematocrit have ranged from 11.4 and 34.5  on February 18, 2006 to a low of 8.6 and 25.6 on the 28th to 9.2 and 27.2 on  the 30th.  White count ranged from 7.4 on the 24th to a high of 12.4 on the  30th.  Platelets went from 192,000 on the 24th to 139,000 on the 29th to  151,000 on the 30th.   PT and INR on the 27th were 20.7 with an INR of 1.7 and then on February 24, 2006, PT is 30.9, INR 2.8.   Sodium dropped to a low of 132 on the 29th.  Glucose ranged from 112 on the  24th to 206 on the 28th.  BUN was 24 with creatinine of 1.36 on the 24th; it  was then within normal limits.  Calcium went to a low of 8.3 on the 28th.  All other laboratory studies were within normal limits.      Legrand Pitts Duffy, P.A.      John L. Rendall, M.D.  Electronically Signed    KED/MEDQ  D:  02/24/2006  T:  02/24/2006  Job:  [id number]   cc:   Ramon Dredge L. Juanetta Gosling, M.D.  Fax: [phone]

## 2010-11-13 NOTE — Discharge Summary (Signed)
NAME:  James [name], James [name]                 ACCOUNT NO.:  [id number]   MEDICAL RECORD NO.:  [id number]          PATIENT TYPE:  INP   LOCATION:  0476                         FACILITY:  [facility]   PHYSICIAN:  John L. Rendall, M.D.  DATE OF BIRTH:  07-Apr-1922   DATE OF ADMISSION:  07/20/2004  DATE OF DISCHARGE:  07/24/2004                                 DISCHARGE SUMMARY   ADMISSION DIAGNOSES:  1.  End-stage osteoarthritis bilateral knees, right greater than left.  2.  History of right total hip arthroplasty.  3.  Hypertension.  4.  Hiatal hernia.  5.  Chronic atrial fibrillation.  6.  Heart murmur.  7.  Bilateral lower extremity edema.  8.  Chronic obstructive pulmonary disease.  9.  Left trigger finger.  10. History of gout.  11. History of pulmonary emboli.  12. History of cataracts.   DISCHARGE DIAGNOSES:  1.  End-stage osteoarthritis bilateral knee status post right total knee      arthroplasty.  2.  Acute blood loss anemia secondary to surgery.  3.  Right lower extremity deep venous thrombosis.  4.  History of right total hip arthroplasty.  5.  Hypertension.  6.  Hiatal hernia.  7.  Chronic atrial fibrillation.  8.  Heart murmur.  9.  Bilateral lower extremity edema.  10. Chronic obstructive pulmonary disease.  11. Left trigger finger.  12. History of gout.  13. History of pulmonary emboli.  14. History of cataracts.   SURGICAL PROCEDURE:  On July 20, 2004 James [name] underwent computer  navigation assisted right total knee replacement by Dr. Jonny Ruiz L. Rendall  assisted by Arnoldo Morale, P.A.-C.  He had an LCS complete primary femoral  component cemented size large right with Depuy MBP keeled tibial tri-  cemented size 5.  An LCS complete metal back patella cemented size large  with an LCS complete RPN insert size large 12.5 mm thickness, which was  wasted and a size large 15 mm thickness polyethylene component was placed in  the patient.   COMPLICATIONS:  None.    CONSULTS:  1.  Pharmacy consult for Coumadin therapy July 20, 2004.  2.  Physical therapy consult July 21, 2004 in addition to a rehab      medicine consult.  3.  Occupational therapy consult July 22, 2004.   HISTORY OF PRESENT ILLNESS:  This 77 year old white male patient presented  to Dr. Priscille Kluver with a history of bilateral knee pain for the last 10-12  years.  The right knee is more painful than the left.  He has difficulty now  with ambulation and moving from a sitting to a standing position.  He  complains of a lot of pain and stiffness in the knees.  No mechanical  symptoms.  X-rays show severe varus deformity of the knee with bone-on-bone  contact in the medial compartment of the joint.  He has failed conservative  treatment and because of that he is presenting for a right knee replacement.   HOSPITAL COURSE:  James [name] tolerated his surgical procedure well and  without immediate postoperative complications.  He was transferred to [facility].  He did have an elevated temperature that first night after surgery  and that was treated with Tylenol and pulmonary toilet.  On postoperative  day #1 T-max was 102.2 and vitals stable.  Hemoglobin was 10.2 and  hematocrit 30.  He had been placed on telemetry immediately postoperatively  due to his history of atrial fibrillation.  He had no cardiac events during  the night.  It was felt he was stable for transfer to the orthopedic floor.  He was transferred there later that day.  He was started on therapy per  protocol.   On postoperative day #2 T-max was 100.8.  Hemoglobin was 8.8 and hematocrit  was 25.9.  The right knee incision was benign.  He was switched to p.o. pain  medications.  Hemoglobin was rechecked and he was subsequently transfused  two units of packed red blood cells.  He tolerated that well.   He continued to make good progress over the next several days.  His  temperature came down.  He was making slow progress  with therapy and it was  felt he would benefit from a course at the rehab or SACU unit over at [facility]  and plans were made for his transfer there on July 24, 2004.  He was  complaining of a mildly positive Homan's sign at that time and a Doppler was  obtained, which was positive for a DVT in the femoral vein but no  superficial thrombus or Baker's cyst.  He was already on Coumadin and that  was maintained.  He was transferred to rehab with them aware of this  situation and they will continue treatment at that time.   DIET:  He can continue his current hospitalization diet with adjustments to  be made per the rehab physicians.   MEDICATIONS:  1.  Colace 100 mg p.o. b.i.d.  2.  Senokot 1 p.o. b.i.d.  3.  Coumadin 1 tablet p.o. 6 p.m.  4.  K-Dur 20 mEq p.o. b.i.d.  5.  Prinivil 40 mg p.o. q.a.m.  6.  Theophylline 200 mg p.o. q.12h.  7.  Lopressor 50 mg p.o. b.i.d.  8.  Lasix 40 mg p.o. q.a.m.  9.  Zyloprim 300 mg p.o. q.a.m.  10. Protonix 40 mg p.o. q.a.m.  11. Cardura 4 mg p.o. q.a.m.  12. Multivitamin 1 tablet p.o. q.a.m.  13. Laxative of choice and enema of choice as needed p.r.n. for      constipation.  14. Percocet 1-2 tablets p.o. q.4h. p.r.n. for pain.  15. Robaxin 500 mg 1-2 tablets p.o. q.6h. p.r.n. for spasms.  16. Lovenox 80 mg subcutaneously q.12h. on July 24, 2004 prior to      transfer to rehab for treatment of the DVT until Coumadin was      therapeutic.   ACTIVITY:  He can be out of bed and weightbearing as tolerated on the right  leg with the use of the walker.  He is to have PT and OT per rehab  protocols.   WOUND CARE:  Please clean the right knee incision with Betadine q.day and  apply a dry dressing.  On postoperative day #14 the staples can be removed  with steri-strips with Benzoin applied.  This can be done at rehab if he is  still there.  Otherwise he needs a followup with Dr. Priscille Kluver in our office at about postoperative day #14 for that staple  removal and first wound  check.  Please notify Dr. Priscille Kluver if temperature is greater than 101.5,  chills, pain unrelieved by pain medications, or foul smelling drainage from  the wound.   FOLLOW UP:  He needs to follow up with Dr. Priscille Kluver in our office either on  postoperative day #14 if his staples are still in place or a week or two  after discharge from the rehab unit.  He needs to call [phone] for that  appointment.   LABORATORY DATA:  On July 21, 2004 white count 12.4, hemoglobin 10.2,  hematocrit 30.  White count went to a high of 13.7 on July 23, 2004 and  on July 24, 2004 it was 9.7.  Hemoglobin dropped to a low of 8.8 on  July 22, 2004 with a hematocrit of 25.9.  On July 24, 2004 it was 9.8  with a hematocrit of 29.3.  Platelet count on July 24, 2004 was 208.   On July 20, 2004 PT 14.8, INR 1.2.  On July 22, 2004 PT 18.5, INR 1.8.  On July 24, 2004 PT 16.8, INR 1.5.   On July 15, 2004 CO2 was 33, glucose 104.  On July 21, 2004 glucose  171.  On July 22, 2004 sodium 133, potassium 3.8, glucose 136, calcium  8.3.  All other laboratory studies were within normal limits.      KED/MEDQ  D:  08/04/2004  T:  08/04/2004  Job:  [id number]   cc:   Ramon Dredge L. Juanetta Gosling, M.D.  [address]  [place]  [place] [address]  Fax: [phone]

## 2010-11-30 ENCOUNTER — Encounter: Payer: BC Managed Care – PPO | Admitting: *Deleted

## 2010-12-02 ENCOUNTER — Ambulatory Visit (INDEPENDENT_AMBULATORY_CARE_PROVIDER_SITE_OTHER): Payer: BC Managed Care – PPO | Admitting: *Deleted

## 2010-12-02 DIAGNOSIS — I4891 Unspecified atrial fibrillation: Secondary | ICD-10-CM

## 2010-12-02 DIAGNOSIS — Z7901 Long term (current) use of anticoagulants: Secondary | ICD-10-CM

## 2010-12-28 ENCOUNTER — Ambulatory Visit (INDEPENDENT_AMBULATORY_CARE_PROVIDER_SITE_OTHER): Payer: BC Managed Care – PPO | Admitting: *Deleted

## 2010-12-28 DIAGNOSIS — Z7901 Long term (current) use of anticoagulants: Secondary | ICD-10-CM

## 2010-12-28 DIAGNOSIS — I4891 Unspecified atrial fibrillation: Secondary | ICD-10-CM

## 2011-01-25 ENCOUNTER — Ambulatory Visit (INDEPENDENT_AMBULATORY_CARE_PROVIDER_SITE_OTHER): Payer: BC Managed Care – PPO | Admitting: *Deleted

## 2011-01-25 DIAGNOSIS — I4891 Unspecified atrial fibrillation: Secondary | ICD-10-CM

## 2011-01-25 DIAGNOSIS — Z7901 Long term (current) use of anticoagulants: Secondary | ICD-10-CM

## 2011-02-22 ENCOUNTER — Ambulatory Visit (INDEPENDENT_AMBULATORY_CARE_PROVIDER_SITE_OTHER): Payer: BC Managed Care – PPO | Admitting: *Deleted

## 2011-02-22 DIAGNOSIS — Z7901 Long term (current) use of anticoagulants: Secondary | ICD-10-CM

## 2011-02-22 DIAGNOSIS — I4891 Unspecified atrial fibrillation: Secondary | ICD-10-CM

## 2011-02-22 LAB — POCT INR: INR: 2.4

## 2011-03-19 LAB — CBC
Hemoglobin: 11.6 — ABNORMAL LOW
MCHC: 33.8
Platelets: 178
RDW: 14.3

## 2011-03-19 LAB — BASIC METABOLIC PANEL
BUN: 23
BUN: 39 — ABNORMAL HIGH
CO2: 28
CO2: 28
Calcium: 9.2
Chloride: 100
Chloride: 106
Creatinine, Ser: 1.55 — ABNORMAL HIGH
GFR calc Af Amer: 44 — ABNORMAL LOW
Glucose, Bld: 78
Potassium: 4.3
Potassium: 5

## 2011-03-19 LAB — PROTIME-INR
INR: 1.6 — ABNORMAL HIGH
Prothrombin Time: 28.1 — ABNORMAL HIGH

## 2011-03-19 LAB — APTT: aPTT: 53 — ABNORMAL HIGH

## 2011-03-19 LAB — HEMOGLOBIN AND HEMATOCRIT, BLOOD
HCT: 31.2 — ABNORMAL LOW
Hemoglobin: 10.5 — ABNORMAL LOW

## 2011-03-19 LAB — DIFFERENTIAL
Basophils Absolute: 0
Basophils Relative: 0
Eosinophils Relative: 6 — ABNORMAL HIGH
Monocytes Absolute: 0.8
Neutro Abs: 4

## 2011-03-22 ENCOUNTER — Ambulatory Visit (INDEPENDENT_AMBULATORY_CARE_PROVIDER_SITE_OTHER): Payer: BC Managed Care – PPO | Admitting: *Deleted

## 2011-03-22 DIAGNOSIS — I4891 Unspecified atrial fibrillation: Secondary | ICD-10-CM

## 2011-03-22 DIAGNOSIS — Z7901 Long term (current) use of anticoagulants: Secondary | ICD-10-CM

## 2011-03-22 LAB — DIFFERENTIAL
Basophils Absolute: 0
Eosinophils Absolute: 0.3
Eosinophils Relative: 4
Lymphs Abs: 1.4
Neutrophils Relative %: 69

## 2011-03-22 LAB — CBC
HCT: 29.8 — ABNORMAL LOW
MCV: 97.8
Platelets: 192
RDW: 14.5
WBC: 7.8

## 2011-03-22 LAB — APTT: aPTT: 56 — ABNORMAL HIGH

## 2011-03-22 LAB — PROTIME-INR: Prothrombin Time: 21.3 — ABNORMAL HIGH

## 2011-03-22 LAB — POCT INR: INR: 2.4

## 2011-04-02 LAB — DIFFERENTIAL
Lymphocytes Relative: 19 % (ref 12–46)
Lymphs Abs: 1.4 10*3/uL (ref 0.7–4.0)
Monocytes Relative: 10 % (ref 3–12)
Neutro Abs: 5.1 10*3/uL (ref 1.7–7.7)
Neutrophils Relative %: 68 % (ref 43–77)

## 2011-04-02 LAB — TRANSFUSION REACTION: Post RXN DAT IgG: NEGATIVE

## 2011-04-02 LAB — URINALYSIS, ROUTINE W REFLEX MICROSCOPIC
Bilirubin Urine: NEGATIVE
Glucose, UA: NEGATIVE mg/dL
Hgb urine dipstick: NEGATIVE
Protein, ur: NEGATIVE mg/dL

## 2011-04-02 LAB — PROTIME-INR
INR: 3.4 — ABNORMAL HIGH (ref 0.00–1.49)
Prothrombin Time: 37 seconds — ABNORMAL HIGH (ref 11.6–15.2)

## 2011-04-02 LAB — CROSSMATCH: Antibody Screen: NEGATIVE

## 2011-04-02 LAB — COMPREHENSIVE METABOLIC PANEL
ALT: 25 U/L (ref 0–53)
AST: 22 U/L (ref 0–37)
Albumin: 3.4 g/dL — ABNORMAL LOW (ref 3.5–5.2)
Alkaline Phosphatase: 99 U/L (ref 39–117)
BUN: 43 mg/dL — ABNORMAL HIGH (ref 6–23)
Chloride: 107 mEq/L (ref 96–112)
GFR calc Af Amer: 43 mL/min — ABNORMAL LOW (ref >60)
Potassium: 4.4 mEq/L (ref 3.5–5.1)
Sodium: 139 mEq/L (ref 135–145)
Total Protein: 5.9 g/dL — ABNORMAL LOW (ref 6.0–8.3)

## 2011-04-02 LAB — PREPARE FRESH FROZEN PLASMA

## 2011-04-02 LAB — CBC
Platelets: 149 10*3/uL — ABNORMAL LOW (ref 150–400)
RBC: 2.64 MIL/uL — ABNORMAL LOW (ref 4.22–5.81)
WBC: 7.5 10*3/uL (ref 4.0–10.5)

## 2011-04-02 LAB — PREPARE RBC (CROSSMATCH)

## 2011-04-02 LAB — HEMOGLOBIN AND HEMATOCRIT, BLOOD
HCT: 23.6 % — ABNORMAL LOW (ref 39.0–52.0)
Hemoglobin: 7.7 g/dL — CL (ref 13.0–17.0)

## 2011-04-02 LAB — ABO/RH: ABO/RH(D): A NEG

## 2011-04-19 ENCOUNTER — Ambulatory Visit (INDEPENDENT_AMBULATORY_CARE_PROVIDER_SITE_OTHER): Payer: BC Managed Care – PPO | Admitting: *Deleted

## 2011-04-19 DIAGNOSIS — Z7901 Long term (current) use of anticoagulants: Secondary | ICD-10-CM

## 2011-04-19 DIAGNOSIS — I4891 Unspecified atrial fibrillation: Secondary | ICD-10-CM

## 2011-04-19 LAB — POCT INR: INR: 2

## 2011-05-10 ENCOUNTER — Telehealth: Payer: Self-pay | Admitting: Cardiology

## 2011-05-10 NOTE — Telephone Encounter (Signed)
PT NEEDS TO KNOW HOW LONG HE SHOULD BE OFF COUMADIN BEFORE GETTING TOOTH PULLED/TMJ

## 2011-05-10 NOTE — Telephone Encounter (Signed)
Please advise regarding holding coumadin for dental procedure

## 2011-05-13 ENCOUNTER — Telehealth: Payer: Self-pay | Admitting: *Deleted

## 2011-05-13 NOTE — Telephone Encounter (Signed)
Discontinuation of warfarin not necessarily required for extraction--that is at the discretion of his dentist. If dentist wants him to be off warfarin, can be stopped 3 days prior and restarted the evening of the      procedure.

## 2011-05-13 NOTE — Telephone Encounter (Signed)
Pt notified that it was not necessary to come off of coumadin for dental procedure.  Will fax copy of note to Dr Lamont Dowdy.

## 2011-05-24 ENCOUNTER — Other Ambulatory Visit (HOSPITAL_COMMUNITY): Payer: Self-pay | Admitting: Pulmonary Disease

## 2011-05-24 ENCOUNTER — Ambulatory Visit (HOSPITAL_COMMUNITY)
Admission: RE | Admit: 2011-05-24 | Discharge: 2011-05-24 | Disposition: A | Discharge status: Home / Self Care | Payer: Medicare Other | Source: Ambulatory Visit | Attending: Pulmonary Disease | Admitting: Pulmonary Disease

## 2011-05-24 DIAGNOSIS — M6281 Muscle weakness (generalized): Secondary | ICD-10-CM | POA: Insufficient documentation

## 2011-05-24 DIAGNOSIS — G319 Degenerative disease of nervous system, unspecified: Secondary | ICD-10-CM | POA: Insufficient documentation

## 2011-05-24 DIAGNOSIS — I6350 Cerebral infarction due to unspecified occlusion or stenosis of unspecified cerebral artery: Secondary | ICD-10-CM | POA: Insufficient documentation

## 2011-05-24 DIAGNOSIS — I639 Cerebral infarction, unspecified: Secondary | ICD-10-CM

## 2011-05-31 ENCOUNTER — Ambulatory Visit (INDEPENDENT_AMBULATORY_CARE_PROVIDER_SITE_OTHER): Payer: BC Managed Care – PPO | Admitting: *Deleted

## 2011-05-31 ENCOUNTER — Ambulatory Visit (INDEPENDENT_AMBULATORY_CARE_PROVIDER_SITE_OTHER): Payer: BC Managed Care – PPO | Admitting: Adult Health

## 2011-05-31 ENCOUNTER — Encounter: Payer: Self-pay | Admitting: Adult Health

## 2011-05-31 ENCOUNTER — Encounter: Payer: BC Managed Care – PPO | Admitting: *Deleted

## 2011-05-31 ENCOUNTER — Encounter: Payer: Self-pay | Admitting: *Deleted

## 2011-05-31 DIAGNOSIS — I4891 Unspecified atrial fibrillation: Secondary | ICD-10-CM

## 2011-05-31 DIAGNOSIS — Z7901 Long term (current) use of anticoagulants: Secondary | ICD-10-CM

## 2011-05-31 DIAGNOSIS — I639 Cerebral infarction, unspecified: Secondary | ICD-10-CM

## 2011-05-31 DIAGNOSIS — I6350 Cerebral infarction due to unspecified occlusion or stenosis of unspecified cerebral artery: Secondary | ICD-10-CM

## 2011-05-31 DIAGNOSIS — E785 Hyperlipidemia, unspecified: Secondary | ICD-10-CM

## 2011-05-31 LAB — POCT INR: INR: 1.9

## 2011-05-31 NOTE — Assessment & Plan Note (Signed)
Per CT scan this was not related to an acute bleed.  He is undergoing PT at home for focal weakness of his right hand.  He remains medically compliant.  BP is mildly elevated this appointment but will not increase his medications dosages at this time.  Will continue to follow.

## 2011-05-31 NOTE — Progress Notes (Signed)
HPI:Mr. Alemu is a very pleasant 77 y/o patient of Dr.Rothbart that we are following for ongoing assessment and treatment of Permanent atrial fibrillation, hypercholesterolemia, and hypertension. 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  He did not seek medical attention until the following day, when his wife took him to Dr. Juanetta Gosling.  He was sent for CT scan which did not demonstrate an acute bleed. He has since been receiving home PT and is beginning to have use of his right hand again. He is unable to do fine motor movements, but otherwise he is without further weakness. Its appears that it is focal weakness only. He has been compliant with his medications. He has had no hospitalizations or ER visits over this last year.  Allergies  Allergen Reactions  . No Known Allergies     Current Outpatient Prescriptions  Medication Sig Dispense Refill  . allopurinol (ZYLOPRIM) 300 MG tablet Take 300 mg by mouth daily.        Marland Kitchen doxazosin (CARDURA) 8 MG tablet Take 8 mg by mouth at bedtime.        . furosemide (LASIX) 20 MG tablet Take 20 mg by mouth daily.        Marland Kitchen lisinopril (PRINIVIL,ZESTRIL) 40 MG tablet Take 40 mg by mouth daily.        . metoprolol (LOPRESSOR) 50 MG tablet Take 50 mg by mouth 2 (two) times daily.        . Multiple Vitamin (MULTIVITAMIN) tablet Take 1 tablet by mouth daily.        Marland Kitchen omeprazole (PRILOSEC) 20 MG capsule Take 20 mg by mouth daily.        . polyethylene glycol (MIRALAX / GLYCOLAX) packet Take 17 g by mouth daily.        . potassium chloride (K-DUR) 10 MEQ tablet Take 10 mEq by mouth daily.        . simvastatin (ZOCOR) 40 MG tablet Take 40 mg by mouth at bedtime.        . theophylline (THEODUR) 100 MG 12 hr tablet Take 100 mg by mouth 2 (two) times daily.        Marland Kitchen warfarin (COUMADIN) 5 MG tablet Take by mouth as directed.          Past Medical History  Diagnosis Date  . Arrhythmia    Permanent atrial fibrillation  . Anemia   . Hypertension   . Chronic kidney disease   . Hyperlipidemia   . CVA (cerebral infarction)     Left posterior parietal infarction    Past Surgical History  Procedure Date  . Renal artery stent   . Hip fracture surgery   . Cholecystectomy   . Appendectomy     FAO:ZHYQMV of systems complete and found to be negative unless listed above PHYSICAL EXAM BP 146/72  Pulse 71  Ht 5\' 8"  (1.727 m)  Wt 201 lb (91.173 kg)  BMI 30.56 kg/m2  General: Well developed, well nourished, in no acute distress Head: Eyes PERRLA, No xanthomas.   Normal cephalic and atramatic  Lungs: Clear bilaterally to auscultation and percussion. Heart: HRIR with 1/6 systolic murmur.  Pulses are 2+ & equal.            No carotid bruit. No JVD.  No abdominal bruits. No femoral bruits. Abdomen: Bowel sounds are positive, abdomen soft and non-tender without masses or  Hernia's noted. Msk:  Back normal,slow gait. Diminished generalized  strength and tone for age, with focal weakness in the right hand.  No fine motor movements, Extremities: No clubbing, cyanosis or edema.  DP +1 Neuro: Alert and oriented X 3. Psych:  Good affect, responds appropriately    ASSESSMENT AND PLAN

## 2011-05-31 NOTE — Patient Instructions (Signed)
Your physician recommends that you schedule a follow-up appointment in: 6 months  Your physician has requested that you have an echocardiogram. Echocardiography is a painless test that uses sound waves to create images of your heart. It provides your doctor with information about the size and shape of your heart and how well your heart's chambers and valves are working. This procedure takes approximately one hour. There are no restrictions for this procedure.    

## 2011-05-31 NOTE — Assessment & Plan Note (Signed)
Fasting lipids and LFT's will be drawn with copy to Dr. Juanetta Gosling.

## 2011-05-31 NOTE — Assessment & Plan Note (Signed)
Heart rate is well controlled. Coumadin clinic has seen him as well today. There was no evidence of bleed on CT of head one week ago.  Will continue current medications. Will repeat his echocardiogram to evaluate his LV fx and valvular fx.  No changes in his current medications.

## 2011-06-02 ENCOUNTER — Ambulatory Visit (HOSPITAL_COMMUNITY)
Admission: RE | Admit: 2011-06-02 | Discharge: 2011-06-02 | Disposition: A | Discharge status: Home / Self Care | Payer: Medicare Other | Source: Ambulatory Visit | Attending: Adult Health | Admitting: Adult Health

## 2011-06-02 DIAGNOSIS — I6789 Other cerebrovascular disease: Secondary | ICD-10-CM | POA: Insufficient documentation

## 2011-06-02 DIAGNOSIS — I10 Essential (primary) hypertension: Secondary | ICD-10-CM | POA: Insufficient documentation

## 2011-06-02 DIAGNOSIS — R002 Palpitations: Secondary | ICD-10-CM | POA: Insufficient documentation

## 2011-06-02 DIAGNOSIS — I517 Cardiomegaly: Secondary | ICD-10-CM

## 2011-06-02 DIAGNOSIS — E785 Hyperlipidemia, unspecified: Secondary | ICD-10-CM | POA: Insufficient documentation

## 2011-06-02 DIAGNOSIS — I4891 Unspecified atrial fibrillation: Secondary | ICD-10-CM | POA: Insufficient documentation

## 2011-06-02 LAB — LIPID PANEL
Cholesterol: 138 mg/dL (ref 0–200)
HDL: 34 mg/dL — ABNORMAL LOW (ref >39)
Total CHOL/HDL Ratio: 4.1 Ratio
Triglycerides: 120 mg/dL (ref <150)

## 2011-06-02 LAB — HEPATIC FUNCTION PANEL
ALT: 15 U/L (ref 0–53)
AST: 24 U/L (ref 0–37)
Bilirubin, Direct: 0.2 mg/dL (ref 0.0–0.3)
Total Protein: 6.4 g/dL (ref 6.0–8.3)

## 2011-06-02 LAB — BASIC METABOLIC PANEL
Calcium: 9.2 mg/dL (ref 8.4–10.5)
Glucose, Bld: 121 mg/dL — ABNORMAL HIGH (ref 70–99)
Sodium: 142 mEq/L (ref 135–145)

## 2011-06-02 NOTE — Progress Notes (Signed)
*  PRELIMINARY RESULTS* Echocardiogram 2D Echocardiogram has been performed.  James [name] 06/02/2011, 8:54 AM

## 2011-06-09 ENCOUNTER — Telehealth: Payer: Self-pay | Admitting: Adult Health

## 2011-06-09 NOTE — Telephone Encounter (Signed)
I have called James [name] to discuss the labs, CT and the echocardiogram. Giving reassurance that all were WNL limits with the exception of the CT showing his left parietal infarct. This infarct was previous and he continues to go to PT for right sided strengthening. No changes are made. He verbalizes understanding.

## 2011-06-09 NOTE — Telephone Encounter (Signed)
PT NEEDS LAB AND ECHO RESULTS.  PLUS NEEDS TO KNOW WHAT THE CT SCAN WAS SHOWING AS FAR AS WHAT SIDE OF THE BODY IT WOULD BE EFFECTING.

## 2011-06-15 ENCOUNTER — Encounter: Payer: Self-pay | Admitting: Cardiology

## 2011-06-17 ENCOUNTER — Ambulatory Visit (INDEPENDENT_AMBULATORY_CARE_PROVIDER_SITE_OTHER): Payer: Medicare Other | Admitting: *Deleted

## 2011-06-17 DIAGNOSIS — Z7901 Long term (current) use of anticoagulants: Secondary | ICD-10-CM

## 2011-06-17 DIAGNOSIS — I4891 Unspecified atrial fibrillation: Secondary | ICD-10-CM

## 2011-07-15 ENCOUNTER — Ambulatory Visit (INDEPENDENT_AMBULATORY_CARE_PROVIDER_SITE_OTHER): Payer: Medicare Other | Admitting: *Deleted

## 2011-07-15 DIAGNOSIS — Z7901 Long term (current) use of anticoagulants: Secondary | ICD-10-CM

## 2011-07-15 DIAGNOSIS — I4891 Unspecified atrial fibrillation: Secondary | ICD-10-CM

## 2011-07-15 LAB — POCT INR: INR: 1.7

## 2011-08-05 ENCOUNTER — Ambulatory Visit (INDEPENDENT_AMBULATORY_CARE_PROVIDER_SITE_OTHER): Payer: Medicare Other | Admitting: *Deleted

## 2011-08-05 DIAGNOSIS — Z7901 Long term (current) use of anticoagulants: Secondary | ICD-10-CM

## 2011-08-05 DIAGNOSIS — I4891 Unspecified atrial fibrillation: Secondary | ICD-10-CM

## 2011-09-01 ENCOUNTER — Ambulatory Visit (INDEPENDENT_AMBULATORY_CARE_PROVIDER_SITE_OTHER): Payer: Medicare Other | Admitting: *Deleted

## 2011-09-01 DIAGNOSIS — I4891 Unspecified atrial fibrillation: Secondary | ICD-10-CM

## 2011-09-01 DIAGNOSIS — Z7901 Long term (current) use of anticoagulants: Secondary | ICD-10-CM

## 2011-09-29 ENCOUNTER — Ambulatory Visit (INDEPENDENT_AMBULATORY_CARE_PROVIDER_SITE_OTHER): Payer: Medicare Other | Admitting: *Deleted

## 2011-09-29 DIAGNOSIS — Z7901 Long term (current) use of anticoagulants: Secondary | ICD-10-CM

## 2011-09-29 DIAGNOSIS — I4891 Unspecified atrial fibrillation: Secondary | ICD-10-CM

## 2011-10-20 ENCOUNTER — Ambulatory Visit (INDEPENDENT_AMBULATORY_CARE_PROVIDER_SITE_OTHER): Payer: Medicare Other | Admitting: *Deleted

## 2011-10-20 DIAGNOSIS — I4891 Unspecified atrial fibrillation: Secondary | ICD-10-CM

## 2011-10-20 DIAGNOSIS — Z7901 Long term (current) use of anticoagulants: Secondary | ICD-10-CM

## 2011-10-20 LAB — POCT INR: INR: 2.1

## 2011-11-17 ENCOUNTER — Ambulatory Visit (INDEPENDENT_AMBULATORY_CARE_PROVIDER_SITE_OTHER): Payer: Medicare Other | Admitting: *Deleted

## 2011-11-17 DIAGNOSIS — I4891 Unspecified atrial fibrillation: Secondary | ICD-10-CM

## 2011-11-17 DIAGNOSIS — Z7901 Long term (current) use of anticoagulants: Secondary | ICD-10-CM

## 2011-11-30 ENCOUNTER — Ambulatory Visit (INDEPENDENT_AMBULATORY_CARE_PROVIDER_SITE_OTHER): Payer: Medicare Other | Admitting: Cardiology

## 2011-11-30 ENCOUNTER — Encounter: Payer: Self-pay | Admitting: Cardiology

## 2011-11-30 VITALS — BP 149/63 | HR 69 | Ht 65.5 in | Wt 203.0 lb

## 2011-11-30 DIAGNOSIS — Z7901 Long term (current) use of anticoagulants: Secondary | ICD-10-CM | POA: Insufficient documentation

## 2011-11-30 DIAGNOSIS — I482 Chronic atrial fibrillation, unspecified: Secondary | ICD-10-CM | POA: Insufficient documentation

## 2011-11-30 DIAGNOSIS — I679 Cerebrovascular disease, unspecified: Secondary | ICD-10-CM | POA: Insufficient documentation

## 2011-11-30 DIAGNOSIS — E785 Hyperlipidemia, unspecified: Secondary | ICD-10-CM

## 2011-11-30 DIAGNOSIS — N183 Chronic kidney disease, stage 3 (moderate): Secondary | ICD-10-CM

## 2011-11-30 DIAGNOSIS — R7301 Impaired fasting glucose: Secondary | ICD-10-CM | POA: Insufficient documentation

## 2011-11-30 DIAGNOSIS — D649 Anemia, unspecified: Secondary | ICD-10-CM | POA: Insufficient documentation

## 2011-11-30 DIAGNOSIS — I4891 Unspecified atrial fibrillation: Secondary | ICD-10-CM

## 2011-11-30 DIAGNOSIS — I10 Essential (primary) hypertension: Secondary | ICD-10-CM

## 2011-11-30 DIAGNOSIS — N189 Chronic kidney disease, unspecified: Secondary | ICD-10-CM

## 2011-11-30 MED ORDER — FUROSEMIDE 40 MG PO TABS: 40.0000 mg | ORAL_TABLET | Freq: Every day | ORAL | Status: DC

## 2011-11-30 NOTE — Assessment & Plan Note (Signed)
Rate in atrial fibrillation is well controlled, and patient is asymptomatic.  Current therapy will be continued.

## 2011-11-30 NOTE — Assessment & Plan Note (Signed)
Insulin resistance is minimal and likely will not have her represent a significant clinical problem.

## 2011-11-30 NOTE — Assessment & Plan Note (Addendum)
Blood pressure is borderline at this visit, but likely is lower when measured outside the office.  Patient and his wife will collect 10 values at home and return a list to [place] for review.  An increase his dose of furosemide, Aimed primarily to decrease peripheral edema, may have a salutary effect on his blood pressure as well.  Patient maintains leg elevation as much as possible.  Information on a low salt diet was provided to him.

## 2011-11-30 NOTE — Assessment & Plan Note (Signed)
Patient has made a reasonable recovery from a CVA likely caused by a cardiogenic embolus.  INR should be maintained at the higher end of the therapeutic scale.

## 2011-11-30 NOTE — Assessment & Plan Note (Signed)
Minimal progression and mild chronic kidney disease.  Serial assessments will be necessary in light of his use of diuretics.

## 2011-11-30 NOTE — Progress Notes (Signed)
Patient ID: James [name], male   DOB: 1922-02-25, 78 y.o.   MRN: [id number]  HPI: Scheduled return visit for this delightful octogenarian with chronic atrial fibrillation.  Approximately one year ago, he suffered a CVA with weakness and clumsiness of the right hand.  He denies any speech or cognitive disturbance.  He continues to have limited use of the right hand, but is able to maintain a large garden while being unable to perform tasks requiring manual dexterity.  He has stopped repairing furniture, which was previously a hobby.  He walks with a cane, but has done so for a number of years and doesn't believe that ambulation is much worse following CVA.  INR closest to his CVA was 1.9.  Prior to Admission medications   Medication Sig Start Date End Date Taking? Authorizing Provider  allopurinol (ZYLOPRIM) 300 MG tablet Take 300 mg by mouth daily.     Yes Historical Provider, MD  doxazosin (CARDURA) 8 MG tablet Take 8 mg by mouth at bedtime.     Yes Historical Provider, MD  furosemide (LASIX) 20 MG tablet Take 20 mg by mouth daily.     Yes Historical Provider, MD  lisinopril (PRINIVIL,ZESTRIL) 40 MG tablet Take 40 mg by mouth daily.     Yes Historical Provider, MD  metoprolol (LOPRESSOR) 50 MG tablet Take 50 mg by mouth 2 (two) times daily.     Yes Historical Provider, MD  Multiple Vitamin (MULTIVITAMIN) tablet Take 1 tablet by mouth daily.     Yes Historical Provider, MD  Multiple Vitamins-Minerals (PRESERVISION AREDS PO) Take by mouth.   Yes Historical Provider, MD  omeprazole (PRILOSEC) 20 MG capsule Take 20 mg by mouth daily.     Yes Historical Provider, MD  polyethylene glycol (MIRALAX / GLYCOLAX) packet Take 17 g by mouth daily.     Yes Historical Provider, MD  potassium chloride (K-DUR) 10 MEQ tablet Take 10 mEq by mouth daily.     Yes Historical Provider, MD  simvastatin (ZOCOR) 40 MG tablet Take 40 mg by mouth at bedtime.     Yes Historical Provider, MD  theophylline (THEODUR) 100 MG 12 hr  tablet Take 100 mg by mouth 2 (two) times daily.     Yes Historical Provider, MD  warfarin (COUMADIN) 5 MG tablet Take by mouth as directed.     Yes Historical Provider, MD   Allergies  Allergen Reactions  . No Known Allergies      Past medical history, social history, and family history reviewed and updated.  ROS: Denies orthopnea, PND, chest discomfort, exertional dyspnea, palpitations, lightheadedness or syncope.  No hematemesis, melena or weakness.  PHYSICAL EXAM: BP 149/63  Pulse 69  Ht 5' 5.5" (1.664 m)  Wt 92.08 kg (203 lb)  BMI 33.27 kg/m2  General-Well developed; no acute distress Body habitus-Mildly to moderately overweight Neck-No JVD; no carotid bruits Lungs-clear lung fields; resonant to percussion; moderate kyphosis Cardiovascular-normal PMI; normal S1 and S2; Irregular rhythm; grade 2/6 holosystolic murmur at the cardiac apex Abdomen-normal bowel sounds; soft and non-tender without masses or organomegaly Musculoskeletal-No deformities, no cyanosis or clubbing Neurologic-Normal cranial nerves; symmetric strength and tone Skin-Warm, no significant lesions Extremities-distal pulses intact; 1-2+ ankle edema  EKG: Atrial fibrillation with a controlled ventricular response and a rate of 69 bpm; otherwise normal.  ASSESSMENT AND PLAN:  [name], MD 11/30/2011 1:23 PM

## 2011-11-30 NOTE — Progress Notes (Deleted)
**Note De-Identified  Obfuscation** Name: DEONTAY LADNIER    DOB: [birth year]  Age: 78 y.o.  MR#: [id number]       PCP:  Fredirick Maudlin, MD, MD      Insurance: @PAYORNAME @   CC:    Chief Complaint  Patient presents with  . 6 month f/u    Pt. c/o swelling in hands and feet. Meds+    VS BP 149/63  Pulse 69  Ht 5' 5.5" (1.664 m)  Wt 203 lb (92.08 kg)  BMI 33.27 kg/m2  Weights Current Weight  11/30/11 203 lb (92.08 kg)  05/31/11 201 lb (91.173 kg)  06/08/10 206 lb (93.441 kg)    Blood Pressure  BP Readings from Last 3 Encounters:  11/30/11 149/63  05/31/11 146/72  06/08/10 135/78     Admit date:  (Not on file) Last encounter with RMR:  06/15/2011   Allergy Allergies  Allergen Reactions  . No Known Allergies     Current Outpatient Prescriptions  Medication Sig Dispense Refill  . allopurinol (ZYLOPRIM) 300 MG tablet Take 300 mg by mouth daily.        Marland Kitchen doxazosin (CARDURA) 8 MG tablet Take 8 mg by mouth at bedtime.        . furosemide (LASIX) 20 MG tablet Take 20 mg by mouth daily.        Marland Kitchen lisinopril (PRINIVIL,ZESTRIL) 40 MG tablet Take 40 mg by mouth daily.        . metoprolol (LOPRESSOR) 50 MG tablet Take 50 mg by mouth 2 (two) times daily.        . Multiple Vitamin (MULTIVITAMIN) tablet Take 1 tablet by mouth daily.        . Multiple Vitamins-Minerals (PRESERVISION AREDS PO) Take by mouth.      Marland Kitchen omeprazole (PRILOSEC) 20 MG capsule Take 20 mg by mouth daily.        . polyethylene glycol (MIRALAX / GLYCOLAX) packet Take 17 g by mouth daily.        . potassium chloride (K-DUR) 10 MEQ tablet Take 10 mEq by mouth daily.        . simvastatin (ZOCOR) 40 MG tablet Take 40 mg by mouth at bedtime.        . theophylline (THEODUR) 100 MG 12 hr tablet Take 100 mg by mouth 2 (two) times daily.        Marland Kitchen warfarin (COUMADIN) 5 MG tablet Take by mouth as directed.          Discontinued Meds:   There are no discontinued medications.  Patient Active Problem List  Diagnoses  . HERPES ZOSTER  . Gout, unspecified  .  Atrial fibrillation, chronic  . Anemia  . Hypertension  . Chronic kidney disease  . Hyperlipidemia  . Cerebrovascular disease  . Chronic anticoagulation  . Fasting hyperglycemia    LABS Anti-coag visit on 11/17/2011  Component Date Value  . INR 11/17/2011 3.0   Anti-coag visit on 10/20/2011  Component Date Value  . INR 10/20/2011 2.1   Anti-coag visit on 09/29/2011  Component Date Value  . INR 09/29/2011 4.1   Anti-coag visit on 09/01/2011  Component Date Value  . INR 09/01/2011 1.9      Results for this Opt Visit:     Results for orders placed in visit on 11/17/11  POCT INR      Component Value Range   INR 3.0      EKG Orders placed in visit on 06/09/10  . CONVERTED CEMR EKG **Note De-Identified  Obfuscation** Prior Assessment and Plan Problem List as of 11/30/2011          Cardiology Problems   Atrial fibrillation, chronic   Hypertension   Hyperlipidemia   Cerebrovascular disease     Other   HERPES ZOSTER   Gout, unspecified   Anemia   Chronic kidney disease   Chronic anticoagulation   Fasting hyperglycemia       Imaging: No results found.   FRS Calculation: Score not calculated

## 2011-11-30 NOTE — Assessment & Plan Note (Signed)
Level of the anemia was minimal on the most recent CBC available to me, which was performed in 2010.  A repeat study will be obtained.

## 2011-11-30 NOTE — Assessment & Plan Note (Signed)
Excellent control of hyperlipidemia on a recent lipid profile.  Current medication will be continued.

## 2011-11-30 NOTE — Assessment & Plan Note (Addendum)
INRs have ranged from 1.9-4.1 indicating generally good control.  Value closest to the time when patient suffered a CVA was 1.9.  Goal range to be 2.5-3.5.  Patient is unsure whether or not he has had colonoscopy in the past, but certainly has not had a recent study, nor is inclined to undergo one.  CBCs and stool for Hemoccults will be monitored to exclude occult GI blood loss.

## 2011-11-30 NOTE — Patient Instructions (Addendum)
**Note De-Identified  Obfuscation** Your physician has recommended you make the following change in your medication: increase Lasix to 40 mg daily  Your provider recommends that you complete 3 hemoccult cards and return them to this office in envelope  Your physician recommends that you return for lab work in: 1 month  Start no salt diet  Your physician recommends that you schedule a follow-up appointment in: 1 year

## 2011-12-02 ENCOUNTER — Encounter (INDEPENDENT_AMBULATORY_CARE_PROVIDER_SITE_OTHER): Payer: Medicare Other

## 2011-12-02 DIAGNOSIS — Z7901 Long term (current) use of anticoagulants: Secondary | ICD-10-CM

## 2011-12-08 ENCOUNTER — Other Ambulatory Visit: Payer: Self-pay

## 2011-12-08 DIAGNOSIS — Z7901 Long term (current) use of anticoagulants: Secondary | ICD-10-CM

## 2011-12-15 ENCOUNTER — Ambulatory Visit (INDEPENDENT_AMBULATORY_CARE_PROVIDER_SITE_OTHER): Payer: Medicare Other | Admitting: *Deleted

## 2011-12-15 DIAGNOSIS — I4891 Unspecified atrial fibrillation: Secondary | ICD-10-CM

## 2011-12-15 DIAGNOSIS — Z7901 Long term (current) use of anticoagulants: Secondary | ICD-10-CM

## 2011-12-29 ENCOUNTER — Ambulatory Visit (INDEPENDENT_AMBULATORY_CARE_PROVIDER_SITE_OTHER): Payer: Medicare Other | Admitting: *Deleted

## 2011-12-29 ENCOUNTER — Other Ambulatory Visit: Payer: Self-pay | Admitting: *Deleted

## 2011-12-29 ENCOUNTER — Other Ambulatory Visit: Payer: Self-pay | Admitting: Cardiology

## 2011-12-29 DIAGNOSIS — Z7901 Long term (current) use of anticoagulants: Secondary | ICD-10-CM

## 2011-12-29 DIAGNOSIS — I4891 Unspecified atrial fibrillation: Secondary | ICD-10-CM

## 2011-12-29 DIAGNOSIS — I10 Essential (primary) hypertension: Secondary | ICD-10-CM

## 2011-12-29 LAB — CBC WITH DIFFERENTIAL/PLATELET
Basophils Absolute: 0 10*3/uL (ref 0.0–0.1)
HCT: 36.3 % — ABNORMAL LOW (ref 39.0–52.0)
Lymphocytes Relative: 26 % (ref 12–46)
Monocytes Absolute: 0.7 10*3/uL (ref 0.1–1.0)
Neutro Abs: 4.5 10*3/uL (ref 1.7–7.7)
Platelets: 172 10*3/uL (ref 150–400)
RDW: 14.7 % (ref 11.5–15.5)
WBC: 7.4 10*3/uL (ref 4.0–10.5)

## 2011-12-29 LAB — POCT INR: INR: 3.3

## 2011-12-30 LAB — BASIC METABOLIC PANEL
Chloride: 103 mEq/L (ref 96–112)
Potassium: 4.2 mEq/L (ref 3.5–5.3)

## 2011-12-31 ENCOUNTER — Encounter: Payer: Self-pay | Admitting: Cardiology

## 2012-01-10 ENCOUNTER — Encounter: Payer: Self-pay | Admitting: *Deleted

## 2012-01-13 ENCOUNTER — Encounter: Payer: Self-pay | Admitting: *Deleted

## 2012-01-24 ENCOUNTER — Ambulatory Visit (INDEPENDENT_AMBULATORY_CARE_PROVIDER_SITE_OTHER): Payer: Medicare Other | Admitting: *Deleted

## 2012-01-24 ENCOUNTER — Other Ambulatory Visit: Payer: Self-pay | Admitting: Cardiology

## 2012-01-24 DIAGNOSIS — I482 Chronic atrial fibrillation, unspecified: Secondary | ICD-10-CM

## 2012-01-24 DIAGNOSIS — Z7901 Long term (current) use of anticoagulants: Secondary | ICD-10-CM

## 2012-01-24 DIAGNOSIS — I4891 Unspecified atrial fibrillation: Secondary | ICD-10-CM

## 2012-01-25 LAB — CBC WITH DIFFERENTIAL/PLATELET
Eosinophils Relative: 1 % (ref 0–5)
HCT: 36.7 % — ABNORMAL LOW (ref 39.0–52.0)
Hemoglobin: 12.4 g/dL — ABNORMAL LOW (ref 13.0–17.0)
Lymphocytes Relative: 20 % (ref 12–46)
Lymphs Abs: 1.7 10*3/uL (ref 0.7–4.0)
MCV: 92.9 fL (ref 78.0–100.0)
Monocytes Absolute: 0.9 10*3/uL (ref 0.1–1.0)
RBC: 3.95 MIL/uL — ABNORMAL LOW (ref 4.22–5.81)
WBC: 8.7 10*3/uL (ref 4.0–10.5)

## 2012-01-25 LAB — BASIC METABOLIC PANEL
BUN: 27 mg/dL — ABNORMAL HIGH (ref 6–23)
CO2: 30 mEq/L (ref 19–32)
Chloride: 102 mEq/L (ref 96–112)
Creat: 1.45 mg/dL — ABNORMAL HIGH (ref 0.50–1.35)

## 2012-02-21 ENCOUNTER — Ambulatory Visit (INDEPENDENT_AMBULATORY_CARE_PROVIDER_SITE_OTHER): Payer: Medicare Other | Admitting: *Deleted

## 2012-02-21 DIAGNOSIS — I4891 Unspecified atrial fibrillation: Secondary | ICD-10-CM

## 2012-02-21 DIAGNOSIS — I482 Chronic atrial fibrillation, unspecified: Secondary | ICD-10-CM

## 2012-02-21 DIAGNOSIS — Z7901 Long term (current) use of anticoagulants: Secondary | ICD-10-CM

## 2012-03-20 ENCOUNTER — Ambulatory Visit (INDEPENDENT_AMBULATORY_CARE_PROVIDER_SITE_OTHER): Payer: Medicare Other | Admitting: *Deleted

## 2012-03-20 DIAGNOSIS — Z7901 Long term (current) use of anticoagulants: Secondary | ICD-10-CM

## 2012-03-20 DIAGNOSIS — I482 Chronic atrial fibrillation, unspecified: Secondary | ICD-10-CM

## 2012-03-20 DIAGNOSIS — I4891 Unspecified atrial fibrillation: Secondary | ICD-10-CM

## 2012-05-01 ENCOUNTER — Ambulatory Visit (INDEPENDENT_AMBULATORY_CARE_PROVIDER_SITE_OTHER): Payer: Medicare Other | Admitting: *Deleted

## 2012-05-01 DIAGNOSIS — I4891 Unspecified atrial fibrillation: Secondary | ICD-10-CM

## 2012-05-01 DIAGNOSIS — Z7901 Long term (current) use of anticoagulants: Secondary | ICD-10-CM

## 2012-05-01 DIAGNOSIS — I482 Chronic atrial fibrillation, unspecified: Secondary | ICD-10-CM

## 2012-05-01 LAB — POCT INR: INR: 2.8

## 2012-06-12 ENCOUNTER — Ambulatory Visit (INDEPENDENT_AMBULATORY_CARE_PROVIDER_SITE_OTHER): Payer: Medicare Other | Admitting: *Deleted

## 2012-06-12 DIAGNOSIS — I4891 Unspecified atrial fibrillation: Secondary | ICD-10-CM

## 2012-06-12 DIAGNOSIS — I482 Chronic atrial fibrillation, unspecified: Secondary | ICD-10-CM

## 2012-06-12 DIAGNOSIS — Z7901 Long term (current) use of anticoagulants: Secondary | ICD-10-CM

## 2012-07-03 ENCOUNTER — Ambulatory Visit (INDEPENDENT_AMBULATORY_CARE_PROVIDER_SITE_OTHER): Payer: Medicare Other | Admitting: *Deleted

## 2012-07-03 DIAGNOSIS — I482 Chronic atrial fibrillation, unspecified: Secondary | ICD-10-CM

## 2012-07-03 DIAGNOSIS — Z7901 Long term (current) use of anticoagulants: Secondary | ICD-10-CM

## 2012-07-03 DIAGNOSIS — I4891 Unspecified atrial fibrillation: Secondary | ICD-10-CM

## 2012-08-02 ENCOUNTER — Ambulatory Visit (INDEPENDENT_AMBULATORY_CARE_PROVIDER_SITE_OTHER): Payer: Medicare Other | Admitting: *Deleted

## 2012-08-02 DIAGNOSIS — I482 Chronic atrial fibrillation, unspecified: Secondary | ICD-10-CM

## 2012-08-02 DIAGNOSIS — Z7901 Long term (current) use of anticoagulants: Secondary | ICD-10-CM

## 2012-08-02 DIAGNOSIS — I4891 Unspecified atrial fibrillation: Secondary | ICD-10-CM

## 2012-08-02 LAB — POCT INR: INR: 2.4

## 2012-08-30 ENCOUNTER — Ambulatory Visit (INDEPENDENT_AMBULATORY_CARE_PROVIDER_SITE_OTHER): Payer: Medicare Other | Admitting: *Deleted

## 2012-08-30 DIAGNOSIS — Z7901 Long term (current) use of anticoagulants: Secondary | ICD-10-CM

## 2012-08-30 DIAGNOSIS — I482 Chronic atrial fibrillation, unspecified: Secondary | ICD-10-CM

## 2012-08-30 DIAGNOSIS — I4891 Unspecified atrial fibrillation: Secondary | ICD-10-CM

## 2012-08-30 LAB — POCT INR: INR: 2.4

## 2012-09-27 ENCOUNTER — Ambulatory Visit (INDEPENDENT_AMBULATORY_CARE_PROVIDER_SITE_OTHER): Payer: Medicare Other | Admitting: *Deleted

## 2012-09-27 DIAGNOSIS — I482 Chronic atrial fibrillation, unspecified: Secondary | ICD-10-CM

## 2012-09-27 DIAGNOSIS — I4891 Unspecified atrial fibrillation: Secondary | ICD-10-CM

## 2012-09-27 DIAGNOSIS — Z7901 Long term (current) use of anticoagulants: Secondary | ICD-10-CM

## 2012-11-08 ENCOUNTER — Ambulatory Visit (INDEPENDENT_AMBULATORY_CARE_PROVIDER_SITE_OTHER): Payer: Medicare Other | Admitting: *Deleted

## 2012-11-08 DIAGNOSIS — I4891 Unspecified atrial fibrillation: Secondary | ICD-10-CM

## 2012-11-08 DIAGNOSIS — I482 Chronic atrial fibrillation, unspecified: Secondary | ICD-10-CM

## 2012-11-08 DIAGNOSIS — Z7901 Long term (current) use of anticoagulants: Secondary | ICD-10-CM

## 2012-12-06 ENCOUNTER — Telehealth: Payer: Self-pay | Admitting: *Deleted

## 2012-12-06 MED ORDER — FUROSEMIDE 40 MG PO TABS: 40.0000 mg | ORAL_TABLET | Freq: Every day | ORAL | Status: DC

## 2012-12-06 NOTE — Telephone Encounter (Signed)
Medication sent via escribe.  

## 2012-12-07 ENCOUNTER — Telehealth: Payer: Self-pay | Admitting: *Deleted

## 2012-12-07 NOTE — Telephone Encounter (Signed)
Per April from [facility] sent out for delivery today.

## 2012-12-07 NOTE — Telephone Encounter (Signed)
PT NEEDS LASIX CALLED IN TO [facility] TO BE DELIVERED

## 2012-12-20 ENCOUNTER — Ambulatory Visit (INDEPENDENT_AMBULATORY_CARE_PROVIDER_SITE_OTHER): Payer: Medicare Other | Admitting: *Deleted

## 2012-12-20 DIAGNOSIS — Z7901 Long term (current) use of anticoagulants: Secondary | ICD-10-CM

## 2012-12-20 DIAGNOSIS — I4891 Unspecified atrial fibrillation: Secondary | ICD-10-CM

## 2012-12-20 DIAGNOSIS — I482 Chronic atrial fibrillation, unspecified: Secondary | ICD-10-CM

## 2012-12-20 LAB — POCT INR: INR: 3.3

## 2012-12-26 DEATH — deceased

## 2012-12-27 ENCOUNTER — Ambulatory Visit: Payer: Medicare Other | Admitting: Adult Health

## 2013-01-02 ENCOUNTER — Ambulatory Visit (INDEPENDENT_AMBULATORY_CARE_PROVIDER_SITE_OTHER): Payer: Medicare Other | Admitting: Adult Health

## 2013-01-02 ENCOUNTER — Encounter: Payer: Self-pay | Admitting: Adult Health

## 2013-01-02 VITALS — BP 140/70 | HR 68 | Ht 65.5 in | Wt 205.1 lb

## 2013-01-02 DIAGNOSIS — I4891 Unspecified atrial fibrillation: Secondary | ICD-10-CM

## 2013-01-02 DIAGNOSIS — I10 Essential (primary) hypertension: Secondary | ICD-10-CM

## 2013-01-02 DIAGNOSIS — E785 Hyperlipidemia, unspecified: Secondary | ICD-10-CM

## 2013-01-02 DIAGNOSIS — I482 Chronic atrial fibrillation, unspecified: Secondary | ICD-10-CM

## 2013-01-02 NOTE — Progress Notes (Signed)
HPI: James [name] is an 79 year old patient of Dr. Dietrich Pates we are following for ongoing assessment and management of chronic atrial fibrillation, with a history of a CVA right-sided weakness, of his right hand. He was last seen by Dr. Dietrich Pates in June of 2013 and was doing well with good rate control of atrial fibrillation the patient's Lasix was increased to 40 mg daily in the setting of peripheral edema. Was continued on Coumadin with a goal range of 2.5 x 3.5.  Since being seen one year ago, he has had no hospitalizations, ER visits, surgeries, or new diagnosis. He is followed by Dr. Juanetta Gosling with change in lasix dose from 40 mg daily to 20 mg daily due to dizziness and hypotension.  He has no new complaints.   Allergies  Allergen Reactions  . No Known Allergies     Current Outpatient Prescriptions  Medication Sig Dispense Refill  . allopurinol (ZYLOPRIM) 300 MG tablet Take 300 mg by mouth daily.        . colchicine 0.6 MG tablet Take 0.6 mg by mouth daily.      Marland Kitchen doxazosin (CARDURA) 8 MG tablet Take 8 mg by mouth at bedtime.        . furosemide (LASIX) 40 MG tablet Take 1 tablet (40 mg total) by mouth daily.  30 tablet  0  . lisinopril (PRINIVIL,ZESTRIL) 40 MG tablet Take 40 mg by mouth daily.        . metoprolol (LOPRESSOR) 50 MG tablet Take 50 mg by mouth 2 (two) times daily.        . Multiple Vitamin (MULTIVITAMIN) tablet Take 1 tablet by mouth daily.        . Multiple Vitamins-Minerals (PRESERVISION AREDS PO) Take by mouth.      Marland Kitchen omeprazole (PRILOSEC) 20 MG capsule Take 20 mg by mouth daily.        . polyethylene glycol (MIRALAX / GLYCOLAX) packet Take 17 g by mouth daily.        . potassium chloride (K-DUR) 10 MEQ tablet Take 10 mEq by mouth daily.        . simvastatin (ZOCOR) 40 MG tablet Take 40 mg by mouth at bedtime.        . theophylline (THEODUR) 100 MG 12 hr tablet Take 100 mg by mouth 2 (two) times daily.        Marland Kitchen warfarin (COUMADIN) 5 MG tablet Take by mouth as directed.          No current facility-administered medications for this visit.    Past Medical History  Diagnosis Date  . Atrial fibrillation, chronic     Permanent atrial fibrillation  . Anemia     H&H of 12.4/38.5 with MCV of 110 in /2012  . Hypertension     Lab 05/2011: Normal CMet except glucose of 121 and creatinine of 1.37  . Chronic kidney disease   . Hyperlipidemia     Lipid profile in 05/2011:138, 120, 34, 80  . Cerebrovascular disease     Left posterior parietal infarction  . Chronic anticoagulation     H&H of 12.4/38.5 in 05/2011; negative stool Hemoccults in 05/2009  . Fasting hyperglycemia     Glucose values of 70-135 in 2010-2012  . Herpes zoster 2011    Past Surgical History  Procedure Laterality Date  . Renal artery stent    . Hip fracture surgery    . Cholecystectomy    . Appendectomy      ROS: Review of  systems complete and found to be negative unless listed above  PHYSICAL EXAM BP 140/70  Pulse 68  Ht 5' 5.5" (1.664 m)  Wt 205 lb 1.9 oz (93.042 kg)  BMI 33.6 kg/m2  SpO2 98%  General: Well developed, well nourished, in no acute distress, obese Head: Eyes PERRLA, No xanthomas.   Normal cephalic and atramatic  Lungs: Clear bilaterally to auscultation and percussion. Heart: HRIR S1 S2, without MRG.  Pulses are 2+ & equal.            No carotid bruit. No JVD.  No abdominal bruits Abdomen: Bowel sounds are positive, abdomen soft and non-tender without masses or                  Hernia's noted. Msk:  Back normal, normal gait. Normal strength and tone for age. Extremities: No clubbing, cyanosis or edema.   DP +1 Neuro: Alert and oriented X 3.Right sided weakness of the hand and arm. Psych:  Good affect, responds appropriately  EKG: Atrial fibrillation rate of 62 bpm.   ASSESSMENT AND PLAN

## 2013-01-02 NOTE — Assessment & Plan Note (Signed)
Blood pressure is controlled currently on lisinopril 40 mg daily. Will continue this as he is tolerating it without dizziness or hypotension. Agree with decreasing lasix to 20 mg daily.

## 2013-01-02 NOTE — Patient Instructions (Addendum)
Your physician recommends that you schedule a follow-up appointment in: 1 YEAR You will receive a reminder letter in the mail in about 10 months reminding you to call and schedule your appointment. If you don't receive this letter, please contact our office.  Your physician recommends that you complete series of hemoccult cards. Once completed you can send to our office as soon as possible.

## 2013-01-02 NOTE — Assessment & Plan Note (Signed)
Heart rate is well controlled on metoprolol. He is followed in our [place] office coumadin clinic. I have provided hemoccult cards to him. He has had recent labs completed by PCP. No changes in current medication regimen.

## 2013-01-02 NOTE — Assessment & Plan Note (Signed)
Continue on simvastatin. He is advised to be as active has he can be and to eat low cholesterol diet.

## 2013-01-04 ENCOUNTER — Ambulatory Visit (INDEPENDENT_AMBULATORY_CARE_PROVIDER_SITE_OTHER): Payer: Medicare Other | Admitting: *Deleted

## 2013-01-04 DIAGNOSIS — Z7901 Long term (current) use of anticoagulants: Secondary | ICD-10-CM

## 2013-01-05 DIAGNOSIS — Z7901 Long term (current) use of anticoagulants: Secondary | ICD-10-CM

## 2013-01-05 LAB — POC HEMOCCULT BLD/STL (HOME/3-CARD/SCREEN)
Card #2 Fecal Occult Blod, POC: NEGATIVE
Card #3 Fecal Occult Blood, POC: NEGATIVE

## 2013-01-09 ENCOUNTER — Encounter: Payer: Self-pay | Admitting: Cardiology

## 2013-01-09 ENCOUNTER — Encounter: Payer: Self-pay | Admitting: *Deleted

## 2013-01-17 ENCOUNTER — Ambulatory Visit (INDEPENDENT_AMBULATORY_CARE_PROVIDER_SITE_OTHER): Payer: Medicare Other | Admitting: *Deleted

## 2013-01-17 DIAGNOSIS — I4891 Unspecified atrial fibrillation: Secondary | ICD-10-CM

## 2013-01-17 DIAGNOSIS — Z7901 Long term (current) use of anticoagulants: Secondary | ICD-10-CM

## 2013-01-17 DIAGNOSIS — I482 Chronic atrial fibrillation, unspecified: Secondary | ICD-10-CM

## 2013-01-17 LAB — POCT INR: INR: 3.6

## 2013-02-05 ENCOUNTER — Other Ambulatory Visit: Payer: Self-pay | Admitting: Cardiology

## 2013-02-14 ENCOUNTER — Ambulatory Visit (INDEPENDENT_AMBULATORY_CARE_PROVIDER_SITE_OTHER): Payer: Medicare Other | Admitting: *Deleted

## 2013-02-14 DIAGNOSIS — Z7901 Long term (current) use of anticoagulants: Secondary | ICD-10-CM

## 2013-02-14 DIAGNOSIS — I4891 Unspecified atrial fibrillation: Secondary | ICD-10-CM

## 2013-02-14 DIAGNOSIS — I482 Chronic atrial fibrillation, unspecified: Secondary | ICD-10-CM

## 2013-02-14 LAB — POCT INR: INR: 2.2

## 2013-03-14 ENCOUNTER — Ambulatory Visit (INDEPENDENT_AMBULATORY_CARE_PROVIDER_SITE_OTHER): Payer: Medicare Other | Admitting: *Deleted

## 2013-03-14 DIAGNOSIS — Z7901 Long term (current) use of anticoagulants: Secondary | ICD-10-CM

## 2013-03-14 DIAGNOSIS — I482 Chronic atrial fibrillation, unspecified: Secondary | ICD-10-CM

## 2013-03-14 DIAGNOSIS — I4891 Unspecified atrial fibrillation: Secondary | ICD-10-CM

## 2013-04-11 ENCOUNTER — Ambulatory Visit (INDEPENDENT_AMBULATORY_CARE_PROVIDER_SITE_OTHER): Payer: Medicare Other | Admitting: *Deleted

## 2013-04-11 DIAGNOSIS — I4891 Unspecified atrial fibrillation: Secondary | ICD-10-CM

## 2013-04-11 DIAGNOSIS — Z7901 Long term (current) use of anticoagulants: Secondary | ICD-10-CM

## 2013-04-11 DIAGNOSIS — I482 Chronic atrial fibrillation, unspecified: Secondary | ICD-10-CM

## 2013-04-11 LAB — POCT INR: INR: 1.8

## 2013-05-02 ENCOUNTER — Ambulatory Visit (INDEPENDENT_AMBULATORY_CARE_PROVIDER_SITE_OTHER): Payer: Medicare Other | Admitting: *Deleted

## 2013-05-02 DIAGNOSIS — Z7901 Long term (current) use of anticoagulants: Secondary | ICD-10-CM

## 2013-05-02 DIAGNOSIS — I4891 Unspecified atrial fibrillation: Secondary | ICD-10-CM

## 2013-05-02 DIAGNOSIS — I482 Chronic atrial fibrillation, unspecified: Secondary | ICD-10-CM

## 2013-05-30 ENCOUNTER — Ambulatory Visit (INDEPENDENT_AMBULATORY_CARE_PROVIDER_SITE_OTHER): Payer: Medicare Other | Admitting: *Deleted

## 2013-05-30 DIAGNOSIS — I482 Chronic atrial fibrillation, unspecified: Secondary | ICD-10-CM

## 2013-05-30 DIAGNOSIS — I4891 Unspecified atrial fibrillation: Secondary | ICD-10-CM

## 2013-05-30 DIAGNOSIS — Z7901 Long term (current) use of anticoagulants: Secondary | ICD-10-CM

## 2013-05-30 LAB — POCT INR: INR: 3

## 2013-06-27 ENCOUNTER — Ambulatory Visit (INDEPENDENT_AMBULATORY_CARE_PROVIDER_SITE_OTHER): Payer: Medicare Other | Admitting: *Deleted

## 2013-06-27 DIAGNOSIS — Z7901 Long term (current) use of anticoagulants: Secondary | ICD-10-CM

## 2013-06-27 DIAGNOSIS — I4891 Unspecified atrial fibrillation: Secondary | ICD-10-CM

## 2013-06-27 DIAGNOSIS — I482 Chronic atrial fibrillation, unspecified: Secondary | ICD-10-CM

## 2013-06-27 LAB — POCT INR: INR: 2.7

## 2013-08-08 ENCOUNTER — Ambulatory Visit (INDEPENDENT_AMBULATORY_CARE_PROVIDER_SITE_OTHER): Payer: Medicare Other | Admitting: *Deleted

## 2013-08-08 DIAGNOSIS — I4891 Unspecified atrial fibrillation: Secondary | ICD-10-CM

## 2013-08-08 DIAGNOSIS — Z7901 Long term (current) use of anticoagulants: Secondary | ICD-10-CM

## 2013-08-08 DIAGNOSIS — I482 Chronic atrial fibrillation, unspecified: Secondary | ICD-10-CM

## 2013-08-08 DIAGNOSIS — Z5181 Encounter for therapeutic drug level monitoring: Secondary | ICD-10-CM

## 2013-08-08 LAB — POCT INR: INR: 1.9

## 2013-09-12 ENCOUNTER — Ambulatory Visit (INDEPENDENT_AMBULATORY_CARE_PROVIDER_SITE_OTHER): Payer: Medicare Other | Admitting: *Deleted

## 2013-09-12 DIAGNOSIS — I4891 Unspecified atrial fibrillation: Secondary | ICD-10-CM

## 2013-09-12 DIAGNOSIS — Z5181 Encounter for therapeutic drug level monitoring: Secondary | ICD-10-CM

## 2013-09-12 DIAGNOSIS — Z7901 Long term (current) use of anticoagulants: Secondary | ICD-10-CM

## 2013-09-12 DIAGNOSIS — I482 Chronic atrial fibrillation, unspecified: Secondary | ICD-10-CM

## 2013-09-12 LAB — POCT INR: INR: 2.4

## 2013-10-10 ENCOUNTER — Ambulatory Visit (INDEPENDENT_AMBULATORY_CARE_PROVIDER_SITE_OTHER): Payer: Medicare Other | Admitting: *Deleted

## 2013-10-10 DIAGNOSIS — Z7901 Long term (current) use of anticoagulants: Secondary | ICD-10-CM

## 2013-10-10 DIAGNOSIS — I482 Chronic atrial fibrillation, unspecified: Secondary | ICD-10-CM

## 2013-10-10 DIAGNOSIS — I4891 Unspecified atrial fibrillation: Secondary | ICD-10-CM

## 2013-10-10 DIAGNOSIS — Z5181 Encounter for therapeutic drug level monitoring: Secondary | ICD-10-CM

## 2013-10-10 LAB — POCT INR: INR: 2.2

## 2013-11-07 ENCOUNTER — Ambulatory Visit (INDEPENDENT_AMBULATORY_CARE_PROVIDER_SITE_OTHER): Payer: Medicare Other | Admitting: *Deleted

## 2013-11-07 DIAGNOSIS — I4891 Unspecified atrial fibrillation: Secondary | ICD-10-CM

## 2013-11-07 DIAGNOSIS — I482 Chronic atrial fibrillation, unspecified: Secondary | ICD-10-CM

## 2013-11-07 DIAGNOSIS — Z5181 Encounter for therapeutic drug level monitoring: Secondary | ICD-10-CM

## 2013-11-07 DIAGNOSIS — Z7901 Long term (current) use of anticoagulants: Secondary | ICD-10-CM

## 2013-11-07 LAB — POCT INR: INR: 2.3

## 2013-12-19 ENCOUNTER — Ambulatory Visit (INDEPENDENT_AMBULATORY_CARE_PROVIDER_SITE_OTHER): Payer: Medicare Other | Admitting: *Deleted

## 2013-12-19 DIAGNOSIS — I4891 Unspecified atrial fibrillation: Secondary | ICD-10-CM

## 2013-12-19 DIAGNOSIS — Z7901 Long term (current) use of anticoagulants: Secondary | ICD-10-CM

## 2013-12-19 DIAGNOSIS — I482 Chronic atrial fibrillation, unspecified: Secondary | ICD-10-CM

## 2013-12-19 DIAGNOSIS — Z5181 Encounter for therapeutic drug level monitoring: Secondary | ICD-10-CM

## 2013-12-19 LAB — POCT INR: INR: 2.9

## 2014-01-30 ENCOUNTER — Ambulatory Visit (INDEPENDENT_AMBULATORY_CARE_PROVIDER_SITE_OTHER): Payer: Medicare Other | Admitting: *Deleted

## 2014-01-30 DIAGNOSIS — I4891 Unspecified atrial fibrillation: Secondary | ICD-10-CM

## 2014-01-30 DIAGNOSIS — Z7901 Long term (current) use of anticoagulants: Secondary | ICD-10-CM

## 2014-01-30 DIAGNOSIS — Z5181 Encounter for therapeutic drug level monitoring: Secondary | ICD-10-CM

## 2014-01-30 DIAGNOSIS — I482 Chronic atrial fibrillation, unspecified: Secondary | ICD-10-CM

## 2014-01-30 LAB — POCT INR: INR: 3.6

## 2014-02-07 ENCOUNTER — Ambulatory Visit (INDEPENDENT_AMBULATORY_CARE_PROVIDER_SITE_OTHER): Payer: Medicare Other | Admitting: Adult Health

## 2014-02-07 ENCOUNTER — Encounter: Payer: Self-pay | Admitting: Adult Health

## 2014-02-07 VITALS — BP 142/82 | HR 68 | Ht 65.0 in | Wt 206.0 lb

## 2014-02-07 DIAGNOSIS — I4891 Unspecified atrial fibrillation: Secondary | ICD-10-CM

## 2014-02-07 DIAGNOSIS — I482 Chronic atrial fibrillation, unspecified: Secondary | ICD-10-CM

## 2014-02-07 DIAGNOSIS — E785 Hyperlipidemia, unspecified: Secondary | ICD-10-CM

## 2014-02-07 MED ORDER — LISINOPRIL 40 MG PO TABS: 40.0000 mg | ORAL_TABLET | Freq: Every day | ORAL | Status: DC

## 2014-02-07 MED ORDER — WARFARIN SODIUM 5 MG PO TABS: 5.0000 mg | ORAL_TABLET | ORAL | Status: DC

## 2014-02-07 MED ORDER — SIMVASTATIN 40 MG PO TABS: 40.0000 mg | ORAL_TABLET | Freq: Every day | ORAL | Status: DC

## 2014-02-07 MED ORDER — FUROSEMIDE 40 MG PO TABS: 20.0000 mg | ORAL_TABLET | Freq: Every day | ORAL | Status: DC

## 2014-02-07 MED ORDER — POTASSIUM CHLORIDE ER 10 MEQ PO TBCR: 10.0000 meq | EXTENDED_RELEASE_TABLET | Freq: Every day | ORAL | Status: DC

## 2014-02-07 MED ORDER — METOPROLOL TARTRATE 50 MG PO TABS: 50.0000 mg | ORAL_TABLET | Freq: Two times a day (BID) | ORAL | Status: DC

## 2014-02-07 NOTE — Progress Notes (Signed)
HPI: [name] is a 80 year old patient to be est. with Dr. Wyline [name] or Dr. Beulah [name] that we are following for ongoing assessment and management of chronic atrial fibrillation, with history CVA right-sided weakness of his right hand, continued on Coumadin therapy with a goal range of 2.5-3.5 INR. He is here for annual followup.  He comes today without any cardiac complaint. He is being followed by his primary care physician for some chronic arthritis pain in his knees, and does have a left knee replacement. He has been experiencing discomfort distal to the knee replacement, and is being referred to orthopedics by his primary care physician.  Otherwise, he has been medically compliant follows consistently in our refill Coumadin clinic, and offers no complaints. He has not had any hospitalizations, new diagnoses, surgeries or ER visit since last being seen.   Allergies  Allergen Reactions  . No Known Allergies     Current Outpatient Prescriptions  Medication Sig Dispense Refill  . allopurinol (ZYLOPRIM) 300 MG tablet Take 300 mg by mouth daily.        . colchicine 0.6 MG tablet Take 0.6 mg by mouth daily.      Marland Kitchen. doxazosin (CARDURA) 8 MG tablet Take 8 mg by mouth at bedtime.        . furosemide (LASIX) 40 MG tablet Take 0.5 tablets (20 mg total) by mouth daily.  90 tablet  3  . lisinopril (PRINIVIL,ZESTRIL) 40 MG tablet Take 1 tablet (40 mg total) by mouth daily.  30 tablet  11  . methylPREDNISolone (MEDROL) 4 MG tablet Take 4 mg by mouth daily. Taper as directed      . metoprolol (LOPRESSOR) 50 MG tablet Take 1 tablet (50 mg total) by mouth 2 (two) times daily.  60 tablet  11  . Multiple Vitamin (MULTIVITAMIN) tablet Take 1 tablet by mouth daily.        . Multiple Vitamins-Minerals (PRESERVISION AREDS PO) Take by mouth.      Marland Kitchen. omeprazole (PRILOSEC) 20 MG capsule Take 20 mg by mouth daily.        . polyethylene glycol (MIRALAX / GLYCOLAX) packet Take 17 g by mouth daily.        .  potassium chloride (K-DUR) 10 MEQ tablet Take 1 tablet (10 mEq total) by mouth daily.  30 tablet  11  . simvastatin (ZOCOR) 40 MG tablet Take 1 tablet (40 mg total) by mouth at bedtime.  30 tablet  11  . warfarin (COUMADIN) 5 MG tablet Take 1 tablet (5 mg total) by mouth as directed.  30 tablet  11   No current facility-administered medications for this visit.    Past Medical History  Diagnosis Date  . Atrial fibrillation, chronic     Permanent atrial fibrillation  . Anemia     H&H of 12.4/38.5 with MCV of 110 in /2012  . Hypertension     Lab 05/2011: Normal CMet except glucose of 121 and creatinine of 1.37  . Chronic kidney disease   . Hyperlipidemia     Lipid profile in 05/2011:138, 120, 34, 80  . Cerebrovascular disease     Left posterior parietal infarction  . Chronic anticoagulation     H&H of 12.4/38.5 in 05/2011; negative stool Hemoccults in 05/2009  . Fasting hyperglycemia     Glucose values of 70-135 in 2010-2012  . Herpes zoster 2011    Past Surgical History  Procedure Laterality Date  . Renal artery stent    . Hip  fracture surgery    . Cholecystectomy    . Appendectomy      ROS: Review of systems complete and found to be negative unless listed above  PHYSICAL EXAM BP 142/82  Pulse 68  Ht 5\' 5"  (1.651 m)  Wt 206 lb (93.441 kg)  BMI 34.28 kg/m2  General: Well developed, well nourished, in no acute distress Head: Eyes PERRLA, No xanthomas.   Normal cephalic and atramatic  Lungs: Clear bilaterally to auscultation and percussion. Heart: HRIR S1 S2, without MRG.  Pulses are 2+ & equal.            No carotid bruit. No JVD.  No abdominal bruits. No femoral bruits. Abdomen: Bowel sounds are positive, abdomen soft and non-tender without masses or                  Hernia's noted. Msk:  Back normal, normal gait. Normal strength and tone for age. Extremities: No clubbing, cyanosis or edema.  DP +1 Neuro: Alert and oriented X 3. Psych:  Good affect, responds  appropriately   EKG: Atrial fibrillation with ventricular rate of 60 beats per minute.  ASSESSMENT AND PLAN

## 2014-02-07 NOTE — Assessment & Plan Note (Signed)
Heart rate is well controlled on current medication regimen. He offers no complaints of bleeding or melena. He is followed closely in our Coumadin clinic and is medically compliant. Will continue him on his current medications without changes.

## 2014-02-07 NOTE — Assessment & Plan Note (Signed)
Excellent blood pressure control. We will continue him on his current medication regimen. Labs are followed by his primary care physician Dr. Juanetta GoslingHawkins, and have recently been drawn within the week.

## 2014-02-07 NOTE — Patient Instructions (Signed)
Your physician wants you to follow-up in: 1 year. You will receive a reminder letter in the mail two months in advance. If you don't receive a letter, please call our office to schedule the follow-up appointment.  Your physician recommends that you continue on your current medications as directed. Please refer to the Current Medication list given to you today.  Thank you for choosing [facility]!!    

## 2014-02-07 NOTE — Assessment & Plan Note (Signed)
He will continue on statin therapy. Recent labs have been completed by Dr. Juanetta GoslingHawkins with medication adjustment as necessary. The patient is provided with all refills on medications provided by cardiology.

## 2014-02-07 NOTE — Progress Notes (Deleted)
Name: James [name]    DOB: 1922-04-19  Age: 80 y.o.  MR#: [id number]       PCP:  Alonza Bogus, MD      Insurance: Payor: [employer] / Plan: BLUE MEDICARE / Product Type: *No Product type* /   CC:    Chief Complaint  Patient presents with  . Atrial Fibrillation    VS Filed Vitals:   02/07/14 1425  BP: 142/82  Pulse: 68  Height: _0  (1.651 m)  Weight: 206 lb (93.441 kg)    Weights Current Weight  02/07/14 206 lb (93.441 kg)  01/02/13 205 lb 1.9 oz (93.042 kg)  11/30/11 203 lb (92.08 kg)    Blood Pressure  BP Readings from Last 3 Encounters:  02/07/14 142/82  01/02/13 140/70  11/30/11 149/63     Admit date:  (Not on file) Last encounter with RMR:  Visit date not found   Allergy No known allergies  Current Outpatient Prescriptions  Medication Sig Dispense Refill  . allopurinol (ZYLOPRIM) 300 MG tablet Take 300 mg by mouth daily.        . colchicine 0.6 MG tablet Take 0.6 mg by mouth daily.      Marland Kitchen doxazosin (CARDURA) 8 MG tablet Take 8 mg by mouth at bedtime.        . furosemide (LASIX) 40 MG tablet Take 0.5 tablets (20 mg total) by mouth daily.  90 tablet  3  . lisinopril (PRINIVIL,ZESTRIL) 40 MG tablet Take 40 mg by mouth daily.        . methylPREDNISolone (MEDROL) 4 MG tablet Take 4 mg by mouth daily. Taper as directed      . metoprolol (LOPRESSOR) 50 MG tablet Take 50 mg by mouth 2 (two) times daily.        . Multiple Vitamin (MULTIVITAMIN) tablet Take 1 tablet by mouth daily.        . Multiple Vitamins-Minerals (PRESERVISION AREDS PO) Take by mouth.      Marland Kitchen omeprazole (PRILOSEC) 20 MG capsule Take 20 mg by mouth daily.        . polyethylene glycol (MIRALAX / GLYCOLAX) packet Take 17 g by mouth daily.        . potassium chloride (K-DUR) 10 MEQ tablet Take 10 mEq by mouth daily.        . simvastatin (ZOCOR) 40 MG tablet Take 40 mg by mouth at bedtime.        Marland Kitchen warfarin (COUMADIN) 5 MG tablet Take by mouth as directed.         No  current facility-administered medications for this visit.    Discontinued Meds:    Medications Discontinued During This Encounter  Medication Reason  . theophylline (THEODUR) 100 MG 12 hr tablet Error  . furosemide (LASIX) 40 MG tablet Reorder    Patient Active Problem List   Diagnosis Date Noted  . Encounter for therapeutic drug monitoring 08/08/2013  . Atrial fibrillation, chronic   . Anemia   . Hypertension   . Chronic kidney disease   . Hyperlipidemia   . Cerebrovascular disease   . Chronic anticoagulation   . Fasting hyperglycemia   . HERPES ZOSTER 06/18/2009  . Gout 06/16/2009    LABS    Component Value Date/Time   NA 138 01/24/2012 1145   NA 140 12/29/2011 1328   NA 142 05/31/2011 1325   K 4.4 01/24/2012 1145   K 4.2 12/29/2011 1328   K 4.6 05/31/2011  1325   CL 102 01/24/2012 1145   CL 103 12/29/2011 1328   CL 104 05/31/2011 1325   CO2 30 01/24/2012 1145   CO2 29 12/29/2011 1328   CO2 27 05/31/2011 1325   GLUCOSE 83 01/24/2012 1145   GLUCOSE 91 12/29/2011 1328   GLUCOSE 121* 05/31/2011 1325   BUN 27* 01/24/2012 1145   BUN 23 12/29/2011 1328   BUN 18 05/31/2011 1325   CREATININE 1.45* 01/24/2012 1145   CREATININE 1.55* 12/29/2011 1328   CREATININE 1.37* 05/31/2011 1325   CREATININE 1.46 06/18/2009 2207   CREATININE 1.57 12/24/2008   CREATININE 1.26 06/30/2008 0435   CALCIUM 9.5 01/24/2012 1145   CALCIUM 9.3 12/29/2011 1328   CALCIUM 9.2 05/31/2011 1325   GFRNONAA 55* 06/30/2008 0435   GFRNONAA 54* 06/29/2008 0430   GFRNONAA 46* 06/28/2008 0530   GFRAA  Value: >60        The eGFR has been calculated using the MDRD equation. This calculation has not been validated in all clinical situations. eGFR's persistently <60 mL/min signify possible Chronic Kidney Disease. 06/30/2008 0435   GFRAA  Value: >60        The eGFR has been calculated using the MDRD equation. This calculation has not been validated in all clinical situations. eGFR's persistently <60 mL/min signify possible Chronic Kidney Disease.  06/29/2008 0430   GFRAA  Value: 56        The eGFR has been calculated using the MDRD equation. This calculation has not been validated in all clinical situations. eGFR's persistently <60 mL/min signify possible Chronic Kidney Disease.* 06/28/2008 0530   CMP     Component Value Date/Time   NA 138 01/24/2012 1145   K 4.4 01/24/2012 1145   CL 102 01/24/2012 1145   CO2 30 01/24/2012 1145   GLUCOSE 83 01/24/2012 1145   BUN 27* 01/24/2012 1145   CREATININE 1.45* 01/24/2012 1145   CREATININE 1.46 06/18/2009 2207   CALCIUM 9.5 01/24/2012 1145   PROT 6.4 05/31/2011 1325   ALBUMIN 4.0 05/31/2011 1325   AST 24 05/31/2011 1325   ALT 15 05/31/2011 1325   ALKPHOS 87 05/31/2011 1325   BILITOT 0.7 05/31/2011 1325   GFRNONAA 55* 06/30/2008 0435   GFRAA  Value: >60        The eGFR has been calculated using the MDRD equation. This calculation has not been validated in all clinical situations. eGFR's persistently <60 mL/min signify possible Chronic Kidney Disease. 06/30/2008 0435       Component Value Date/Time   WBC 8.7 01/24/2012 1145   WBC 7.4 12/29/2011 1328   WBC 7.9 06/18/2009 2207   HGB 12.4* 01/24/2012 1145   HGB 12.1* 12/29/2011 1328   HGB 12.4* 06/18/2009 2207   HCT 36.7* 01/24/2012 1145   HCT 36.3* 12/29/2011 1328   HCT 38.5* 06/18/2009 2207   MCV 92.9 01/24/2012 1145   MCV 94.3 12/29/2011 1328   MCV 100.0 06/18/2009 2207    Lipid Panel     Component Value Date/Time   CHOL 138 05/31/2011 1325   TRIG 120 05/31/2011 1325   HDL 34* 05/31/2011 1325   CHOLHDL 4.1 05/31/2011 1325   VLDL 24 05/31/2011 1325   LDLCALC 80 05/31/2011 1325    ABG No results found for this basename: phart, pco2, pco2art, po2, po2art, hco3, tco2, acidbasedef, o2sat     No results found for this basename: TSH   BNP (last 3 results) No results found for this basename: PROBNP,  in the last 8760  hours Cardiac Panel (last 3 results) No results found for this basename: CKTOTAL, CKMB, TROPONINI, RELINDX,  in the last 72 hours   Iron/TIBC/Ferritin/ %Sat No results found for this basename: iron, tibc, ferritin, ironpctsat     EKG Orders placed in visit on 02/07/14  . EKG 12-LEAD     Prior Assessment and Plan Problem List as of 02/07/2014     Cardiovascular and Mediastinum   Atrial fibrillation, chronic   Last Assessment & Plan   01/02/2013 Office Visit Written 01/02/2013  3:51 PM by Lendon Colonel, NP     Heart rate is well controlled on metoprolol. He is followed in our [place] office coumadin clinic. I have provided hemoccult cards to him. He has had recent labs completed by PCP. No changes in current medication regimen.    Hypertension   Last Assessment & Plan   01/02/2013 Office Visit Written 01/02/2013  3:53 PM by Lendon Colonel, NP     Blood pressure is controlled currently on lisinopril 40 mg daily. Will continue this as he is tolerating it without dizziness or hypotension. Agree with decreasing lasix to 20 mg daily.    Cerebrovascular disease   Last Assessment & Plan   11/30/2011 Office Visit Written 11/30/2011  2:09 PM by Yehuda Savannah, MD     Patient has made a reasonable recovery from a CVA likely caused by a cardiogenic embolus.  INR should be maintained at the higher end of the therapeutic scale.      Endocrine   Fasting hyperglycemia   Last Assessment & Plan   11/30/2011 Office Visit Written 11/30/2011  2:10 PM by Yehuda Savannah, MD     Insulin resistance is minimal and likely will not have her represent a significant clinical problem.      Genitourinary   Chronic kidney disease   Last Assessment & Plan   11/30/2011 Office Visit Written 11/30/2011  2:05 PM by Yehuda Savannah, MD     Minimal progression and mild chronic kidney disease.  Serial assessments will be necessary in light of his use of diuretics.      Other   HERPES ZOSTER   Gout   Anemia   Last Assessment & Plan   11/30/2011 Office Visit Written 11/30/2011  2:08 PM by Yehuda Savannah, MD     Level of the anemia was  minimal on the most recent CBC available to me, which was performed in 2010.  A repeat study will be obtained.    Hyperlipidemia   Last Assessment & Plan   01/02/2013 Office Visit Written 01/02/2013  3:53 PM by Lendon Colonel, NP     Continue on simvastatin. He is advised to be as active has he can be and to eat low cholesterol diet.     Chronic anticoagulation   Last Assessment & Plan   11/30/2011 Office Visit Edited 11/30/2011  2:10 PM by Yehuda Savannah, MD     INRs have ranged from 1.9-4.1 indicating generally good control.  Value closest to the time when patient suffered a CVA was 1.9.  Goal range to be 2.5-3.5.  Patient is unsure whether or not he has had colonoscopy in the past, but certainly has not had a recent study, nor is inclined to undergo one.  CBCs and stool for Hemoccults will be monitored to exclude occult GI blood loss.    Encounter for therapeutic drug monitoring       Imaging: No results found.

## 2014-02-20 ENCOUNTER — Ambulatory Visit (INDEPENDENT_AMBULATORY_CARE_PROVIDER_SITE_OTHER): Payer: Medicare Other | Admitting: *Deleted

## 2014-02-20 DIAGNOSIS — Z5181 Encounter for therapeutic drug level monitoring: Secondary | ICD-10-CM

## 2014-02-20 DIAGNOSIS — I482 Chronic atrial fibrillation, unspecified: Secondary | ICD-10-CM

## 2014-02-20 DIAGNOSIS — I4891 Unspecified atrial fibrillation: Secondary | ICD-10-CM

## 2014-02-20 DIAGNOSIS — Z7901 Long term (current) use of anticoagulants: Secondary | ICD-10-CM

## 2014-02-20 LAB — POCT INR: INR: 4.9

## 2014-03-07 ENCOUNTER — Ambulatory Visit (INDEPENDENT_AMBULATORY_CARE_PROVIDER_SITE_OTHER): Payer: Medicare Other | Admitting: *Deleted

## 2014-03-07 DIAGNOSIS — I482 Chronic atrial fibrillation, unspecified: Secondary | ICD-10-CM

## 2014-03-07 DIAGNOSIS — I4891 Unspecified atrial fibrillation: Secondary | ICD-10-CM

## 2014-03-07 DIAGNOSIS — Z7901 Long term (current) use of anticoagulants: Secondary | ICD-10-CM

## 2014-03-07 DIAGNOSIS — Z5181 Encounter for therapeutic drug level monitoring: Secondary | ICD-10-CM

## 2014-03-07 LAB — POCT INR: INR: 2.5

## 2014-03-28 ENCOUNTER — Ambulatory Visit (INDEPENDENT_AMBULATORY_CARE_PROVIDER_SITE_OTHER): Payer: Medicare Other | Admitting: *Deleted

## 2014-03-28 ENCOUNTER — Other Ambulatory Visit: Payer: Self-pay | Admitting: Cardiovascular Disease

## 2014-03-28 DIAGNOSIS — I482 Chronic atrial fibrillation, unspecified: Secondary | ICD-10-CM

## 2014-03-28 DIAGNOSIS — Z5181 Encounter for therapeutic drug level monitoring: Secondary | ICD-10-CM

## 2014-03-28 DIAGNOSIS — Z7901 Long term (current) use of anticoagulants: Secondary | ICD-10-CM

## 2014-03-28 LAB — POCT INR: INR: 1.5

## 2014-03-28 NOTE — Telephone Encounter (Signed)
Refill denied as pt had rx e-scribed on 02/07/14,[facility]    Pt dose is 20 mg daily not 40 mg, LM for wife to call

## 2014-04-08 ENCOUNTER — Ambulatory Visit (INDEPENDENT_AMBULATORY_CARE_PROVIDER_SITE_OTHER): Payer: Medicare Other | Admitting: *Deleted

## 2014-04-08 DIAGNOSIS — Z5181 Encounter for therapeutic drug level monitoring: Secondary | ICD-10-CM

## 2014-04-08 DIAGNOSIS — Z7901 Long term (current) use of anticoagulants: Secondary | ICD-10-CM

## 2014-04-08 DIAGNOSIS — I482 Chronic atrial fibrillation, unspecified: Secondary | ICD-10-CM

## 2014-04-08 LAB — POCT INR: INR: 2.9

## 2014-04-29 ENCOUNTER — Ambulatory Visit (INDEPENDENT_AMBULATORY_CARE_PROVIDER_SITE_OTHER): Payer: Medicare Other | Admitting: *Deleted

## 2014-04-29 DIAGNOSIS — Z5181 Encounter for therapeutic drug level monitoring: Secondary | ICD-10-CM

## 2014-04-29 DIAGNOSIS — I482 Chronic atrial fibrillation, unspecified: Secondary | ICD-10-CM

## 2014-04-29 DIAGNOSIS — Z7901 Long term (current) use of anticoagulants: Secondary | ICD-10-CM

## 2014-04-29 LAB — POCT INR: INR: 2.4

## 2014-05-27 ENCOUNTER — Ambulatory Visit (INDEPENDENT_AMBULATORY_CARE_PROVIDER_SITE_OTHER): Payer: Medicare Other | Admitting: *Deleted

## 2014-05-27 DIAGNOSIS — I482 Chronic atrial fibrillation, unspecified: Secondary | ICD-10-CM

## 2014-05-27 DIAGNOSIS — Z5181 Encounter for therapeutic drug level monitoring: Secondary | ICD-10-CM

## 2014-05-27 DIAGNOSIS — Z7901 Long term (current) use of anticoagulants: Secondary | ICD-10-CM

## 2014-05-27 LAB — POCT INR: INR: 2.5

## 2014-07-01 ENCOUNTER — Ambulatory Visit (INDEPENDENT_AMBULATORY_CARE_PROVIDER_SITE_OTHER): Payer: Medicare Other | Admitting: *Deleted

## 2014-07-01 DIAGNOSIS — I482 Chronic atrial fibrillation, unspecified: Secondary | ICD-10-CM

## 2014-07-01 DIAGNOSIS — Z7901 Long term (current) use of anticoagulants: Secondary | ICD-10-CM

## 2014-07-01 DIAGNOSIS — Z5181 Encounter for therapeutic drug level monitoring: Secondary | ICD-10-CM

## 2014-07-01 LAB — POCT INR: INR: 2.1

## 2014-08-19 ENCOUNTER — Ambulatory Visit (INDEPENDENT_AMBULATORY_CARE_PROVIDER_SITE_OTHER): Payer: Medicare Other | Admitting: *Deleted

## 2014-08-19 DIAGNOSIS — Z5181 Encounter for therapeutic drug level monitoring: Secondary | ICD-10-CM

## 2014-08-19 DIAGNOSIS — I482 Chronic atrial fibrillation, unspecified: Secondary | ICD-10-CM

## 2014-08-19 DIAGNOSIS — Z7901 Long term (current) use of anticoagulants: Secondary | ICD-10-CM

## 2014-08-19 LAB — POCT INR: INR: 2.2

## 2014-09-27 DEATH — deceased

## 2014-09-30 ENCOUNTER — Ambulatory Visit (INDEPENDENT_AMBULATORY_CARE_PROVIDER_SITE_OTHER): Payer: Medicare Other | Admitting: *Deleted

## 2014-09-30 DIAGNOSIS — I482 Chronic atrial fibrillation, unspecified: Secondary | ICD-10-CM

## 2014-09-30 DIAGNOSIS — Z7901 Long term (current) use of anticoagulants: Secondary | ICD-10-CM | POA: Diagnosis not present

## 2014-09-30 DIAGNOSIS — Z5181 Encounter for therapeutic drug level monitoring: Secondary | ICD-10-CM

## 2014-09-30 LAB — POCT INR: INR: 2.3

## 2014-11-11 ENCOUNTER — Ambulatory Visit (INDEPENDENT_AMBULATORY_CARE_PROVIDER_SITE_OTHER): Payer: Medicare Other | Admitting: *Deleted

## 2014-11-11 DIAGNOSIS — Z7901 Long term (current) use of anticoagulants: Secondary | ICD-10-CM

## 2014-11-11 DIAGNOSIS — I482 Chronic atrial fibrillation, unspecified: Secondary | ICD-10-CM

## 2014-11-11 DIAGNOSIS — Z5181 Encounter for therapeutic drug level monitoring: Secondary | ICD-10-CM | POA: Diagnosis not present

## 2014-11-11 LAB — POCT INR: INR: 2.5

## 2014-12-23 ENCOUNTER — Ambulatory Visit (INDEPENDENT_AMBULATORY_CARE_PROVIDER_SITE_OTHER): Payer: Medicare Other | Admitting: *Deleted

## 2014-12-23 DIAGNOSIS — Z7901 Long term (current) use of anticoagulants: Secondary | ICD-10-CM

## 2014-12-23 DIAGNOSIS — Z5181 Encounter for therapeutic drug level monitoring: Secondary | ICD-10-CM | POA: Diagnosis not present

## 2014-12-23 DIAGNOSIS — I482 Chronic atrial fibrillation, unspecified: Secondary | ICD-10-CM

## 2014-12-23 LAB — POCT INR: INR: 3.5

## 2015-01-06 ENCOUNTER — Other Ambulatory Visit: Payer: Self-pay | Admitting: Adult Health

## 2015-01-06 ENCOUNTER — Ambulatory Visit (INDEPENDENT_AMBULATORY_CARE_PROVIDER_SITE_OTHER): Payer: Medicare Other | Admitting: *Deleted

## 2015-01-06 DIAGNOSIS — I482 Chronic atrial fibrillation, unspecified: Secondary | ICD-10-CM

## 2015-01-06 DIAGNOSIS — Z5181 Encounter for therapeutic drug level monitoring: Secondary | ICD-10-CM | POA: Diagnosis not present

## 2015-01-06 DIAGNOSIS — Z7901 Long term (current) use of anticoagulants: Secondary | ICD-10-CM | POA: Diagnosis not present

## 2015-01-06 LAB — POCT INR: INR: 2.7

## 2015-01-22 ENCOUNTER — Other Ambulatory Visit: Payer: Self-pay | Admitting: Adult Health

## 2015-02-10 ENCOUNTER — Encounter: Payer: Self-pay | Admitting: Cardiovascular Disease

## 2015-02-10 ENCOUNTER — Ambulatory Visit (INDEPENDENT_AMBULATORY_CARE_PROVIDER_SITE_OTHER): Payer: Medicare Other | Admitting: Cardiovascular Disease

## 2015-02-10 ENCOUNTER — Ambulatory Visit (INDEPENDENT_AMBULATORY_CARE_PROVIDER_SITE_OTHER): Payer: Medicare Other | Admitting: *Deleted

## 2015-02-10 VITALS — BP 122/70 | HR 96 | Ht 62.0 in | Wt 204.6 lb

## 2015-02-10 DIAGNOSIS — Z5181 Encounter for therapeutic drug level monitoring: Secondary | ICD-10-CM

## 2015-02-10 DIAGNOSIS — I482 Chronic atrial fibrillation, unspecified: Secondary | ICD-10-CM

## 2015-02-10 DIAGNOSIS — Z7901 Long term (current) use of anticoagulants: Secondary | ICD-10-CM | POA: Diagnosis not present

## 2015-02-10 DIAGNOSIS — Z01818 Encounter for other preprocedural examination: Secondary | ICD-10-CM

## 2015-02-10 DIAGNOSIS — I10 Essential (primary) hypertension: Secondary | ICD-10-CM | POA: Diagnosis not present

## 2015-02-10 LAB — POCT INR: INR: 2.2

## 2015-02-10 NOTE — Patient Instructions (Signed)
Your physician wants you to follow-up in:  1 YEAR WITH DR. KONESWARAN. You will receive a reminder letter in the mail two months in advance. If you don't receive a letter, please call our office to schedule the follow-up appointment.   Your physician recommends that you continue on your current medications as directed. Please refer to the Current Medication list given to you today.  Thanks for choosing [facility]!!!   

## 2015-02-10 NOTE — Progress Notes (Signed)
Patient ID: James [name], male   DOB: 1922/05/03, 81 y.o.   MRN: [id number]      SUBJECTIVE: The patient is a 81 year old male with a history of chronic atrial fibrillation and hypertension as well as prior CVA. He is managed on metoprolol and warfarin. This is my first time meeting him.  He is being scheduled to undergo left total knee replacement. He needs a revision as he had this previously performed in 2007. He denies chest pain, palpitations, and shortness of breath.   ECG performed in the office today demonstrates atrial fibrillation, heart rate 70 bpm.  Review of Systems: As per "subjective", otherwise negative.  Allergies  Allergen Reactions  . No Known Allergies     Current Outpatient Prescriptions  Medication Sig Dispense Refill  . allopurinol (ZYLOPRIM) 300 MG tablet Take 300 mg by mouth daily.      . colchicine 0.6 MG tablet Take 0.6 mg by mouth daily.    Marland Kitchen doxazosin (CARDURA) 8 MG tablet Take 8 mg by mouth at bedtime.      . furosemide (LASIX) 40 MG tablet Take 0.5 tablets (20 mg total) by mouth daily. 90 tablet 3  . HYDROcodone-acetaminophen (NORCO/VICODIN) 5-325 MG per tablet Take 1 tablet by mouth every 6 (six) hours as needed for moderate pain.    Marland Kitchen lisinopril (PRINIVIL,ZESTRIL) 40 MG tablet TAKE ONE TABLET BY MOUTH DAILY. 30 tablet 11  . metoprolol (LOPRESSOR) 50 MG tablet Take 1 tablet (50 mg total) by mouth 2 (two) times daily. 60 tablet 11  . Multiple Vitamin (MULTIVITAMIN) tablet Take 1 tablet by mouth daily.      . Multiple Vitamins-Minerals (PRESERVISION AREDS PO) Take by mouth.    Marland Kitchen omeprazole (PRILOSEC) 20 MG capsule Take 20 mg by mouth daily.      . polyethylene glycol (MIRALAX / GLYCOLAX) packet Take 17 g by mouth daily.      . potassium chloride (K-DUR) 10 MEQ tablet Take 1 tablet (10 mEq total) by mouth daily. 30 tablet 11  . simvastatin (ZOCOR) 40 MG tablet TAKE (1) TABLET BY MOUTH AT BEDTIME FOR CHOLESTEROL. 30 tablet 9  . warfarin (COUMADIN) 5 MG  tablet Take 1 tablet (5 mg total) by mouth as directed. 30 tablet 11   No current facility-administered medications for this visit.    Past Medical History  Diagnosis Date  . Atrial fibrillation, chronic     Permanent atrial fibrillation  . Anemia     H&H of 12.4/38.5 with MCV of 110 in /2012  . Hypertension     Lab 05/2011: Normal CMet except glucose of 121 and creatinine of 1.37  . Chronic kidney disease   . Hyperlipidemia     Lipid profile in 05/2011:138, 120, 34, 80  . Cerebrovascular disease     Left posterior parietal infarction  . Chronic anticoagulation     H&H of 12.4/38.5 in 05/2011; negative stool Hemoccults in 05/2009  . Fasting hyperglycemia     Glucose values of 70-135 in 2010-2012  . Herpes zoster 2011    Past Surgical History  Procedure Laterality Date  . Renal artery stent    . Hip fracture surgery    . Cholecystectomy    . Appendectomy      Social History   Social History  . Marital Status: Married    Spouse Name: N/A  . Number of Children: N/A  . Years of Education: N/A   Occupational History  . Not on file.   Social History  Main Topics  . Smoking status: Never Smoker   . Smokeless tobacco: Never Used  . Alcohol Use: No  . Drug Use: No  . Sexual Activity: No   Other Topics Concern  . Not on file   Social History Narrative     Filed Vitals:   02/10/15 1422  BP: 122/70  Pulse: 96  Height: 5\' 2"  (1.575 m)  Weight: 204 lb 9.6 oz (92.806 kg)  SpO2: 95%    PHYSICAL EXAM General: NAD HEENT: Normal. Neck: No JVD, no thyromegaly. Lungs: Clear to auscultation bilaterally with normal respiratory effort. CV: Nondisplaced PMI.  Irregular rhythm, normal rate, normal S1/S2, no S3, no murmur. Trace pretibial edema.     Abdomen: Soft,  no distention.  Neurologic: Alert and oriented x 3.  Psych: Normal affect. Skin: Normal. Musculoskeletal: Left knee swollen with brace. Extremities: No clubbing or cyanosis.   ECG: Most recent ECG  reviewed.      ASSESSMENT AND PLAN: 1. Chronic atrial fibrillation: INR therapeutic at 2.2 today. HR controlled. Asymptomatic. Continue metoprolol 50 mg bid.  2. Essential HTN: Controlled. No changes.  3. Preoperative risk stratification:  Echocardiogram performed on 06/02/11 demonstrated normal left ventricular systolic function, LVEF 60-65%, with moderate biatrial enlargement and moderately elevated pulmonary pressures , 49 mmHg. I do not feel he wants any noninvasive testing at this time. He can proceed with surgery as planned.  Dispo: f/u 1 year.   Prentice Docker, M.D., F.A.C.C.

## 2015-02-20 ENCOUNTER — Other Ambulatory Visit: Payer: Self-pay | Admitting: Adult Health

## 2015-02-25 ENCOUNTER — Telehealth: Payer: Self-pay | Admitting: *Deleted

## 2015-02-25 NOTE — Telephone Encounter (Signed)
Spoke with pt's wife and she states he  had  slight nose bleed lasing for few minutes this morning.  Pt does not take coumadin  on Mondays so pt's wife instructed that could not tell her to hold his coumadin unless knew what his INR was and that with him not taking coumadin on yesterday his INR will drop any way. Instructed that if he has another nose bleed to call  back and will get him in to check INR  Also instructed to use some saline nasal spray to keep nares moist and to take him to ER if has nose bleed that cannot be stopped . Also found that he has been scheduled for surgery on Sept 20th so changed his appt in coumadin clinic to Sept 6th to give instructions regarding holding coumadin for surgery and his wife states understanding of these instructions

## 2015-02-25 NOTE — Telephone Encounter (Signed)
Pt is having nose bleeds and is wondering if he needs to take his coumadin

## 2015-03-04 ENCOUNTER — Ambulatory Visit (INDEPENDENT_AMBULATORY_CARE_PROVIDER_SITE_OTHER): Payer: Medicare Other | Admitting: *Deleted

## 2015-03-04 ENCOUNTER — Telehealth: Payer: Self-pay | Admitting: *Deleted

## 2015-03-04 DIAGNOSIS — I482 Chronic atrial fibrillation, unspecified: Secondary | ICD-10-CM

## 2015-03-04 DIAGNOSIS — Z5181 Encounter for therapeutic drug level monitoring: Secondary | ICD-10-CM | POA: Diagnosis not present

## 2015-03-04 DIAGNOSIS — Z7901 Long term (current) use of anticoagulants: Secondary | ICD-10-CM

## 2015-03-04 LAB — POCT INR: INR: 2

## 2015-03-04 NOTE — Telephone Encounter (Signed)
-----   Message from Laqueta Linden, MD sent at 03/04/2015  9:14 AM EDT ----- That should be fine.   ----- Message -----    From: Louanna Raw, RN    Sent: 03/04/2015   7:14 AM      To: Laqueta Linden, MD  Pt has been scheduled for Total knee revision. Can he hold coumadin without bridging?

## 2015-03-05 ENCOUNTER — Other Ambulatory Visit: Payer: Self-pay | Admitting: Adult Health

## 2015-03-12 ENCOUNTER — Encounter (HOSPITAL_COMMUNITY): Payer: Self-pay

## 2015-03-12 ENCOUNTER — Ambulatory Visit (HOSPITAL_COMMUNITY)
Admission: RE | Admit: 2015-03-12 | Discharge: 2015-03-12 | Disposition: A | Discharge status: Home / Self Care | Payer: Medicare Other | Source: Ambulatory Visit | Attending: Orthopedic Surgery | Admitting: Orthopedic Surgery

## 2015-03-12 ENCOUNTER — Encounter (HOSPITAL_COMMUNITY)
Admission: RE | Admit: 2015-03-12 | Discharge: 2015-03-12 | Disposition: A | Discharge status: Home / Self Care | Payer: Medicare Other | Source: Ambulatory Visit | Attending: Orthopaedic Surgery | Admitting: Orthopaedic Surgery

## 2015-03-12 DIAGNOSIS — I10 Essential (primary) hypertension: Secondary | ICD-10-CM | POA: Diagnosis not present

## 2015-03-12 DIAGNOSIS — N289 Disorder of kidney and ureter, unspecified: Secondary | ICD-10-CM | POA: Diagnosis not present

## 2015-03-12 DIAGNOSIS — Z01818 Encounter for other preprocedural examination: Secondary | ICD-10-CM

## 2015-03-12 DIAGNOSIS — Z01812 Encounter for preprocedural laboratory examination: Secondary | ICD-10-CM | POA: Insufficient documentation

## 2015-03-12 HISTORY — DX: Gastro-esophageal reflux disease without esophagitis: K21.9

## 2015-03-12 HISTORY — DX: Cardiac arrhythmia, unspecified: I49.9

## 2015-03-12 HISTORY — DX: Other pulmonary embolism without acute cor pulmonale: I26.99

## 2015-03-12 HISTORY — DX: Personal history of urinary calculi: Z87.442

## 2015-03-12 HISTORY — DX: Acute embolism and thrombosis of unspecified deep veins of unspecified lower extremity: I82.409

## 2015-03-12 HISTORY — DX: Cerebral infarction, unspecified: I63.9

## 2015-03-12 HISTORY — DX: Unspecified osteoarthritis, unspecified site: M19.90

## 2015-03-12 LAB — CBC WITH DIFFERENTIAL/PLATELET
Basophils Absolute: 0 10*3/uL (ref 0.0–0.1)
Basophils Relative: 0 %
EOS ABS: 0.2 10*3/uL (ref 0.0–0.7)
Eosinophils Relative: 2 %
HEMATOCRIT: 39.7 % (ref 39.0–52.0)
HEMOGLOBIN: 13 g/dL (ref 13.0–17.0)
LYMPHS ABS: 1.9 10*3/uL (ref 0.7–4.0)
LYMPHS PCT: 26 %
MCH: 31.9 pg (ref 26.0–34.0)
MCHC: 32.7 g/dL (ref 30.0–36.0)
MCV: 97.3 fL (ref 78.0–100.0)
MONOS PCT: 11 %
Monocytes Absolute: 0.8 10*3/uL (ref 0.1–1.0)
NEUTROS ABS: 4.3 10*3/uL (ref 1.7–7.7)
NEUTROS PCT: 60 %
Platelets: 187 10*3/uL (ref 150–400)
RBC: 4.08 MIL/uL — AB (ref 4.22–5.81)
RDW: 14.8 % (ref 11.5–15.5)
WBC: 7.2 10*3/uL (ref 4.0–10.5)

## 2015-03-12 LAB — COMPREHENSIVE METABOLIC PANEL
ALK PHOS: 108 U/L (ref 38–126)
ALT: 22 U/L (ref 17–63)
AST: 34 U/L (ref 15–41)
Albumin: 3.5 g/dL (ref 3.5–5.0)
Anion gap: 7 (ref 5–15)
BILIRUBIN TOTAL: 0.6 mg/dL (ref 0.3–1.2)
BUN: 14 mg/dL (ref 6–20)
CALCIUM: 9.2 mg/dL (ref 8.9–10.3)
CO2: 28 mmol/L (ref 22–32)
CREATININE: 1.31 mg/dL — AB (ref 0.61–1.24)
Chloride: 101 mmol/L (ref 101–111)
GFR calc non Af Amer: 46 mL/min — ABNORMAL LOW (ref >60)
GFR, EST AFRICAN AMERICAN: 53 mL/min — AB (ref >60)
GLUCOSE: 126 mg/dL — AB (ref 65–99)
Potassium: 4.3 mmol/L (ref 3.5–5.1)
SODIUM: 136 mmol/L (ref 135–145)
TOTAL PROTEIN: 6.9 g/dL (ref 6.5–8.1)

## 2015-03-12 LAB — PROTIME-INR
INR: 2.34 — ABNORMAL HIGH (ref 0.00–1.49)
Prothrombin Time: 25.4 seconds — ABNORMAL HIGH (ref 11.6–15.2)

## 2015-03-12 LAB — SURGICAL PCR SCREEN
MRSA, PCR: NEGATIVE
Staphylococcus aureus: NEGATIVE

## 2015-03-12 LAB — URINALYSIS, ROUTINE W REFLEX MICROSCOPIC
Bilirubin Urine: NEGATIVE
Glucose, UA: NEGATIVE mg/dL
Hgb urine dipstick: NEGATIVE
Ketones, ur: NEGATIVE mg/dL
Nitrite: NEGATIVE
PROTEIN: NEGATIVE mg/dL
Specific Gravity, Urine: 1.013 (ref 1.005–1.030)
UROBILINOGEN UA: 0.2 mg/dL (ref 0.0–1.0)
pH: 5 (ref 5.0–8.0)

## 2015-03-12 LAB — URINE MICROSCOPIC-ADD ON

## 2015-03-12 LAB — TYPE AND SCREEN
ABO/RH(D): A NEG
Antibody Screen: NEGATIVE

## 2015-03-12 LAB — SEDIMENTATION RATE: Sed Rate: 36 mm/hr — ABNORMAL HIGH (ref 0–16)

## 2015-03-12 LAB — ABO/RH: ABO/RH(D): A NEG

## 2015-03-12 LAB — C-REACTIVE PROTEIN: CRP: 0.6 mg/dL (ref <1.0)

## 2015-03-12 LAB — APTT: aPTT: 40 seconds — ABNORMAL HIGH (ref 24–37)

## 2015-03-12 NOTE — Progress Notes (Signed)
Patient sees Dr. Purvis Sheffield for cardiology.  PCP is HAWKINS,EDWARD L, MD   ECHO: 06/02/2011  Stress: Patient has had in the past; can't remember when.  Will request from [facility] office.  EKG: 02/10/15  Cath: Denies.  Patient has clearance from Dr. Purvis Sheffield in note from 02/10/15 in EPIC.

## 2015-03-12 NOTE — Progress Notes (Signed)
Dr. Junius Argyle office states they have no record of patient having a stress test in the past.

## 2015-03-12 NOTE — Pre-Procedure Instructions (Addendum)
    James [name]  03/12/2015      [facility] - [place], [place] - [address] [place] [address] Phone: [phone] Fax: [phone]    Your procedure is scheduled on Tuesday Monday September 20th 2016 at 715 am.  Report to [facility] Admitting at 530 A.M.  Call this number if you have problems the morning of surgery:  [phone]   Remember:  Do not eat food or drink liquids after midnight Monday Sept 19th.  Take these medicines the morning of surgery with A SIP OF WATER allopurinol (zyloprim), colchicine, metoprolol (lopressor), omeprazole (prilosec). If needed: pain pill (norco/vicodin)  STOP: ALL Vitamins, Supplements, Effient and Herbal Medications, Fish Oils, Aspirins, NSAIDs (Nonsteroidal Anti-inflammatories such as Ibuprofen, Aleve, or Advil), and Goody's/BC Powders 7 days prior to surgery, until after surgery as directed by your physician. This includes: Multivitamins.  Take your last dose of Coumadin on Sept. 14th as directed by your cardiologist.  Resume coumadin only when instructed by your physician.    Do not wear jewelry.  Do not wear lotions, powders, or perfumes.  You may wear deodorant.  Do not shave 48 hours prior to surgery.  Men may shave face and neck.  Do not bring valuables to the hospital.  [facility] is not responsible for any belongings or valuables.  Contacts, dentures or bridgework may not be worn into surgery.  Leave your suitcase in the car.  After surgery it may be brought to your room.  For patients admitted to the hospital, discharge time will be determined by your treatment team.  Patients discharged the day of surgery will not be allowed to drive home.    Special instructions:  Please follow these instructions carefully:  1. Shower with CHG Soap the night before surgery and the morning of Surgery. 2. If you choose to wash your hair, wash your hair first as usual with your  normal shampoo. 3. After you shampoo, rinse your hair and body thoroughly to remove the Shampoo. 4. Use CHG as you would any other liquid soap. You can apply chg directly to the skin and wash gently with scrungie or a clean washcloth. 5. Apply the CHG Soap to your body ONLY FROM THE NECK DOWN. Do not use on open wounds or open sores. Avoid contact with your eyes, ears, mouth and genitals (private parts). Wash genitals (private parts) with your normal soap. 6. Wash thoroughly, paying special attention to the area where your surgery will be performed. 7. Thoroughly rinse your body with warm water from the neck down. 8. DO NOT shower/wash with your normal soap after using and rinsing off the CHG Soap. 9. Pat yourself dry with a clean towel.  10. Wear clean pajamas.  11. Place clean sheets on your bed the night of your first shower and do not sleep with pets.  Day of Surgery  Do not apply any lotions/deodorants the morning of surgery. Please wear clean clothes to the hospital/surgery center.    Please read over the following fact sheets that you were given. Pain Booklet, Coughing and Deep Breathing, Blood Transfusion Information, MRSA Information and Surgical Site Infection Prevention

## 2015-03-12 NOTE — Progress Notes (Signed)
   03/12/15 1324  OBSTRUCTIVE SLEEP APNEA  Have you ever been diagnosed with sleep apnea through a sleep study? No  Do you snore loudly (loud enough to be heard through closed doors)?  1  Do you often feel tired, fatigued, or sleepy during the daytime (such as falling asleep during driving or talking to someone)? 0  Has anyone observed you stop breathing during your sleep? 0  Do you have, or are you being treated for high blood pressure? 1  BMI more than 35 kg/m2? 1  Age > 50 (1-yes) 1  Neck circumference greater than:Male 16 inches or larger, Male 17inches or larger? 1 (37.5)  Male Gender (Yes=1) 1  Obstructive Sleep Apnea Score 6  Score 5 or greater  Results sent to PCP

## 2015-03-13 LAB — URINE CULTURE

## 2015-03-13 NOTE — Progress Notes (Addendum)
Anesthesia Chart Review:  Pt is 81 year old James [name] scheduled for L total knee revision on 03/18/2015 with Dr. Cleophas Dunker.   Cardiologist is Dr. Purvis Sheffield.   PMH includes: atrial fibrillation, HTN, stroke, PE, DVT, hyperlipidemia, anemia, GERD. On chronic anticoagulation. Never smoker. BMI 37.   Medications include: doxazosin, lasix, lisinopril, metoprolol, prilosec, potassium, simvastatin, coumadin. Pt to take last dose of coumadin 03/12/15.   Preoperative labs reviewed.  PT 25.4. Will recheck DOS.   Chest x-ray 03/12/2015 reviewed. No acute cardiopulmonary disease.   EKG 02/10/2015: atrial fibrillation. HR 70 bpm.   Echo 06/02/2011: - Left ventricle: The cavity size was normal. There was a very discrete are of fairly prominent focal basal hypertrophy of the septum. Systolic function was normal. The estimated ejection fraction was in the range of 60% to 65%. Wall motion was normal; there were no regional wall motion abnormalities. - Aortic valve: Trivial regurgitation. Valve area: 1.84cm^2(VTI). Valve area: 1.75cm^2 (Vmax). - Mitral valve: Calcified annulus. - Left atrium: The atrium was moderately dilated. - Right atrium: The atrium was moderately dilated. - Atrial septum: No defect or patent foramen ovale was identified. - Pulmonary arteries: Systolic pressure was mildly increased. PA peak pressure: 49mm Hg (S).  Nuclear stress test 10/03/2003: -Negative pharmacologic stress Cardiolite study revealing no stress-induced EKG abnormalities, normal left ventricular function and slight left ventricular dilatation.  -By scintigraphic imaging, there was diaphragmatic attenuation, but no convincing evidence for myocardial ischemia or infarction.  Pt has clearance for surgery from Dr. Purvis Sheffield in Epic note dated 02/10/15.   If PT acceptable DOS, I anticipate pt can proceed with surgery as scheduled.   Rica Mast, FNP-BC [facility]/Anesthesiology Phone:  [phone] 03/13/2015 11:51 AM

## 2015-03-14 NOTE — H&P (Signed)
CHIEF COMPLAINT:  Painful left total knee replacement.     HISTORY OF PRESENT ILLNESS:  [name] is a very pleasant 81 year old white male who is seen today for evaluation of his left knee.  He has a left total knee replacement performed in 2009 and had been doing fairly well until about a year ago when he started having pain in the left total knee.  X-rays at that time appeared to show a loosening of the tibial component with some subsidence, particularly medially.  The prosthesis was in a valgus position, but the tibia was in varus and seemed to be exacerbated with weightbearing.  He had done fairly well with a [employer] brace, but then also used a rolling walker.  He does have a history of mini-stroke several years ago.  He still has some residual weakness.  Apparently has also had a history of pulmonary embolus and was in the ICU for 6 weeks.  He has been seen and evaluated most recently in August of this year and has had physical therapy and continued to do his exercises at home.  He is now having worsening pain and is to the point now where he is having to use a wheelchair for ambulatory purposes.  His pain is worsening.  He has pain now with every step in regard to his left knee.  Radiographically, apparently he has had changes to the total knee in regard to loosening and subsidence.  Seen today for evaluation.     PAST MEDICAL HISTORY:  In general, his health is fair.     PAST SURGICAL HISTORY:   Hospitalizations: 1.  In 1937, appendectomy. 2.  In 1979, cholecystectomy. 3.  In 1996 for ORIF of the hip.   4.  In 1990s for removal of the plate of the ORIF. 5.  In 2000 he had, according to the wife, a total hip replacement.   6.  In 2005, right total knee arthroplasty. 7.  In 2007, left total knee arthroplasty.  8.  He has also had a pulmonary embolus and was hospitalized for that and then rehab.     CURRENT MEDICATIONS:   1.    Allopurinol 300 mg daily. 2.    Colchicine 0.6 mg daily. 3.     Doxazosin 8 mg daily. 4.    Furosemide 40 mg daily. 5.    Lisinopril 40 mg daily. 6.    Metoprolol 50 mg b.i.d.  7.    Multivitamin daily.  8.    Multivitamin with minerals 1 daily. 9.    Omeprazole 20 mg daily. 10.  Polyethylene glycol 17 grams daily.  11.  Potassium chloride 10 mEq daily.  12.  Simvastatin 40 mg daily. 13.  Warfarin 5 mg daily.    ALLERGIES:  Sulfa.    FAMILY HISTORY:  Reveals cancer in a mother, and in a brother hypertension, diabetes and arthritis and cancer.     SOCIAL HISTORY:  James [name] is a 81 year old white married male.  Retired from farming.  He did not smoke cigarettes or use alcohol.     REVIEW OF SYSTEMS:  A 14-point review of systems is positive for cataract extractions x2.  He has had bronchitis several years ago, nothing recently.  He does have an occasional morning cough.  He has had a stroke several years ago and has some residual weakness.  He has been hypertensive for many years and is on multiple medications.  States that he did have a heart murmur when he was  younger.  He does have a history of a-fib also and is treated with Coumadin.  He has had a pulmonary embolus.  He had a renal calculi somewhere around 1973.  He does have nocturia, but then again he also has bowel movements at nighttime.     PHYSICAL EXAMINATION:   Height is 5 feet 4 inches, weight 203 pounds, BMI 34.8.  Vital signs reveal a temperature of 98.4, pulse 72, respirations 12, blood pressure 134/80. Head:  Normocephalic.  Eyes:  Pupils equal, round and reactive to light and accommodation with extraocular movements intact.  Ears, nose and throat:  Grossly benign. Chest:  Good expansion.   Lungs:  Did have coarse breath sounds bilaterally.  No rales.   Cardiac:   Regular rate with multiple ectopics.  Questionable irregularly irregular with a slow rate.     Vascular:  Pulses not obtainable in the lower extremity.  He certainly does have more edema in the left ankle than on the right, but he  does wear an elastic brace around his left knee.  Abdomen:  Obese, soft, nontender.  No masses palpable.  Normal bowel sounds present.   CNS:  He appears oriented x3.  Cranial nerves II-XII grossly intact.   Musculoskeletal:  He has range of motion from about 3 degrees to only about 100 degrees.  He does have pseudolaxity with valgus stressing.  Calf is supple, nontender.      RADIOGRAPHS:  Radiographs reveal a subsidence of the medial portion of the tibial plateau.  This causes marked varus positioning of the knee.     CLINICAL IMPRESSION:   1.  Painful failed left total knee arthroplasty.   2.  Atrial fibrillation. 3.  History of CVA.  4.  History of hypertension. 5.  History of pulmonary embolus. 6.  Coumadin usage.    RECOMMENDATIONS:  At this time, I reviewed a clearance form from Dr. Juanetta Gosling, who feels from a medical standpoint it is an acceptable risk to perform total knee revision.  Certainly he is at risk, but he felt it was cleared to do this.  Also have obtained a cardiology clearance form from Dr. Purvis Sheffield, who feels that he is also a candidate for revision total knee arthroplasty.  Therefore, our plan is to proceed with a left total knee arthroplasty revision.  Procedure risks and benefits were explained to him, his wife and his son.  I have demonstrated with appropriate models.  Gone over all the risks with him, including that of death.  I have also explained that he may have a hinged total knee replacement if necessary.  He is understanding and would like to proceed with total knee replacement.    Oris Drone Aleda Grana [facility] [phone]  03/17/2015 3:30 PM

## 2015-03-17 MED ORDER — CEFAZOLIN SODIUM-DEXTROSE 2-3 GM-% IV SOLR
2.0000 g | INTRAVENOUS | Status: AC
  Administered 2015-03-18: 2 g via INTRAVENOUS
  Filled 2015-03-17: qty 50

## 2015-03-18 ENCOUNTER — Inpatient Hospital Stay (HOSPITAL_COMMUNITY): Payer: Medicare Other

## 2015-03-18 ENCOUNTER — Inpatient Hospital Stay (HOSPITAL_COMMUNITY)
Admission: RE | Admit: 2015-03-18 | Discharge: 2015-03-20 | DRG: 467 | Disposition: A | Discharge status: Skilled Nursing Facility | Payer: Medicare Other | Source: Ambulatory Visit | Attending: Orthopaedic Surgery | Admitting: Orthopaedic Surgery

## 2015-03-18 ENCOUNTER — Inpatient Hospital Stay (HOSPITAL_COMMUNITY): Payer: Medicare Other | Admitting: Certified Registered"

## 2015-03-18 ENCOUNTER — Encounter (HOSPITAL_COMMUNITY): Payer: Self-pay | Admitting: *Deleted

## 2015-03-18 ENCOUNTER — Encounter (HOSPITAL_COMMUNITY)
Admission: RE | Disposition: A | Discharge status: Skilled Nursing Facility | Payer: Self-pay | Source: Ambulatory Visit | Attending: Orthopaedic Surgery

## 2015-03-18 ENCOUNTER — Inpatient Hospital Stay (HOSPITAL_COMMUNITY): Payer: Medicare Other | Admitting: Emergency Medicine

## 2015-03-18 DIAGNOSIS — K219 Gastro-esophageal reflux disease without esophagitis: Secondary | ICD-10-CM | POA: Diagnosis present

## 2015-03-18 DIAGNOSIS — Y792 Prosthetic and other implants, materials and accessory orthopedic devices associated with adverse incidents: Secondary | ICD-10-CM | POA: Diagnosis present

## 2015-03-18 DIAGNOSIS — Z7901 Long term (current) use of anticoagulants: Secondary | ICD-10-CM

## 2015-03-18 DIAGNOSIS — Z8249 Family history of ischemic heart disease and other diseases of the circulatory system: Secondary | ICD-10-CM

## 2015-03-18 DIAGNOSIS — Z96659 Presence of unspecified artificial knee joint: Secondary | ICD-10-CM

## 2015-03-18 DIAGNOSIS — E785 Hyperlipidemia, unspecified: Secondary | ICD-10-CM | POA: Diagnosis present

## 2015-03-18 DIAGNOSIS — I482 Chronic atrial fibrillation: Secondary | ICD-10-CM | POA: Diagnosis present

## 2015-03-18 DIAGNOSIS — Z86718 Personal history of other venous thrombosis and embolism: Secondary | ICD-10-CM

## 2015-03-18 DIAGNOSIS — Z79899 Other long term (current) drug therapy: Secondary | ICD-10-CM | POA: Diagnosis not present

## 2015-03-18 DIAGNOSIS — M1990 Unspecified osteoarthritis, unspecified site: Secondary | ICD-10-CM | POA: Diagnosis present

## 2015-03-18 DIAGNOSIS — T84093A Other mechanical complication of internal left knee prosthesis, initial encounter: Principal | ICD-10-CM

## 2015-03-18 DIAGNOSIS — Z8673 Personal history of transient ischemic attack (TIA), and cerebral infarction without residual deficits: Secondary | ICD-10-CM

## 2015-03-18 DIAGNOSIS — I10 Essential (primary) hypertension: Secondary | ICD-10-CM | POA: Diagnosis present

## 2015-03-18 DIAGNOSIS — T8484XA Pain due to internal orthopedic prosthetic devices, implants and grafts, initial encounter: Secondary | ICD-10-CM | POA: Diagnosis present

## 2015-03-18 DIAGNOSIS — Z86711 Personal history of pulmonary embolism: Secondary | ICD-10-CM

## 2015-03-18 DIAGNOSIS — D62 Acute posthemorrhagic anemia: Secondary | ICD-10-CM | POA: Diagnosis not present

## 2015-03-18 DIAGNOSIS — Z419 Encounter for procedure for purposes other than remedying health state, unspecified: Secondary | ICD-10-CM

## 2015-03-18 HISTORY — PX: TOTAL KNEE REVISION: SHX996

## 2015-03-18 LAB — BASIC METABOLIC PANEL WITH GFR
Anion gap: 8 (ref 5–15)
BUN: 13 mg/dL (ref 6–20)
CO2: 27 mmol/L (ref 22–32)
Calcium: 9 mg/dL (ref 8.9–10.3)
Chloride: 104 mmol/L (ref 101–111)
Creatinine, Ser: 1.22 mg/dL (ref 0.61–1.24)
GFR calc Af Amer: 58 mL/min — ABNORMAL LOW
GFR calc non Af Amer: 50 mL/min — ABNORMAL LOW
Glucose, Bld: 165 mg/dL — ABNORMAL HIGH (ref 65–99)
Potassium: 3.7 mmol/L (ref 3.5–5.1)
Sodium: 139 mmol/L (ref 135–145)

## 2015-03-18 LAB — CBC
HEMATOCRIT: 35.5 % — AB (ref 39.0–52.0)
Hemoglobin: 11.7 g/dL — ABNORMAL LOW (ref 13.0–17.0)
MCH: 31.9 pg (ref 26.0–34.0)
MCHC: 33 g/dL (ref 30.0–36.0)
MCV: 96.7 fL (ref 78.0–100.0)
Platelets: 148 10*3/uL — ABNORMAL LOW (ref 150–400)
RBC: 3.67 MIL/uL — AB (ref 4.22–5.81)
RDW: 14.7 % (ref 11.5–15.5)
WBC: 13.2 10*3/uL — AB (ref 4.0–10.5)

## 2015-03-18 LAB — PROTIME-INR
INR: 1.51 — AB (ref 0.00–1.49)
PROTHROMBIN TIME: 18.3 s — AB (ref 11.6–15.2)

## 2015-03-18 SURGERY — TOTAL KNEE REVISION
Anesthesia: Choice | Site: Knee | Laterality: Left

## 2015-03-18 MED ORDER — PHENYLEPHRINE HCL 10 MG/ML IJ SOLN
INTRAMUSCULAR | Status: DC | PRN
  Administered 2015-03-18: 80 ug via INTRAVENOUS
  Administered 2015-03-18: 40 ug via INTRAVENOUS
  Administered 2015-03-18 (×2): 80 ug via INTRAVENOUS

## 2015-03-18 MED ORDER — EPHEDRINE SULFATE 50 MG/ML IJ SOLN
INTRAMUSCULAR | Status: DC | PRN
  Administered 2015-03-18: 15 mg via INTRAVENOUS
  Administered 2015-03-18: 10 mg via INTRAVENOUS

## 2015-03-18 MED ORDER — DEXAMETHASONE SODIUM PHOSPHATE 4 MG/ML IJ SOLN
INTRAMUSCULAR | Status: AC
  Filled 2015-03-18: qty 2

## 2015-03-18 MED ORDER — OXYCODONE HCL 5 MG PO TABS: 5.0000 mg | ORAL_TABLET | ORAL | Status: DC | PRN

## 2015-03-18 MED ORDER — WARFARIN - PHARMACIST DOSING INPATIENT: Freq: Every day | Status: DC

## 2015-03-18 MED ORDER — LACTATED RINGERS IV SOLN
INTRAVENOUS | Status: DC | PRN
  Administered 2015-03-18: 07:00:00 via INTRAVENOUS

## 2015-03-18 MED ORDER — THROMBIN 20000 UNITS EX KIT
PACK | CUTANEOUS | Status: AC
  Filled 2015-03-18: qty 1

## 2015-03-18 MED ORDER — MIDAZOLAM HCL 2 MG/2ML IJ SOLN
INTRAMUSCULAR | Status: AC
  Filled 2015-03-18: qty 4

## 2015-03-18 MED ORDER — ONE-DAILY MULTI VITAMINS PO TABS: 1.0000 | ORAL_TABLET | Freq: Every day | ORAL | Status: DC

## 2015-03-18 MED ORDER — SUCCINYLCHOLINE CHLORIDE 20 MG/ML IJ SOLN
INTRAMUSCULAR | Status: AC
  Filled 2015-03-18: qty 1

## 2015-03-18 MED ORDER — EPHEDRINE SULFATE 50 MG/ML IJ SOLN
INTRAMUSCULAR | Status: AC
  Filled 2015-03-18: qty 1

## 2015-03-18 MED ORDER — ROCURONIUM BROMIDE 100 MG/10ML IV SOLN
INTRAVENOUS | Status: DC | PRN
  Administered 2015-03-18: 25 mg via INTRAVENOUS

## 2015-03-18 MED ORDER — METHOCARBAMOL 1000 MG/10ML IJ SOLN
500.0000 mg | Freq: Four times a day (QID) | INTRAMUSCULAR | Status: DC | PRN
  Filled 2015-03-18: qty 5

## 2015-03-18 MED ORDER — SODIUM CHLORIDE 0.9 % IV SOLN: INTRAVENOUS | Status: DC

## 2015-03-18 MED ORDER — CEFAZOLIN SODIUM-DEXTROSE 2-3 GM-% IV SOLR
2.0000 g | Freq: Four times a day (QID) | INTRAVENOUS | Status: AC
  Administered 2015-03-18 – 2015-03-19 (×6): 2 g via INTRAVENOUS
  Filled 2015-03-18 (×8): qty 50

## 2015-03-18 MED ORDER — SODIUM CHLORIDE 0.9 % IV SOLN
INTRAVENOUS | Status: DC
  Administered 2015-03-18: 17:00:00 via INTRAVENOUS

## 2015-03-18 MED ORDER — SODIUM CHLORIDE 0.9 % IR SOLN
Status: DC | PRN
  Administered 2015-03-18: 3000 mL

## 2015-03-18 MED ORDER — DOCUSATE SODIUM 100 MG PO CAPS
100.0000 mg | ORAL_CAPSULE | Freq: Two times a day (BID) | ORAL | Status: DC
  Administered 2015-03-18 – 2015-03-20 (×4): 100 mg via ORAL
  Filled 2015-03-18 (×4): qty 1

## 2015-03-18 MED ORDER — ACETAMINOPHEN 10 MG/ML IV SOLN
1000.0000 mg | Freq: Four times a day (QID) | INTRAVENOUS | Status: AC
Start: 2015-03-18 — End: 2015-03-19
  Administered 2015-03-18 – 2015-03-19 (×4): 1000 mg via INTRAVENOUS
  Filled 2015-03-18 (×3): qty 100

## 2015-03-18 MED ORDER — FENTANYL CITRATE (PF) 100 MCG/2ML IJ SOLN
INTRAMUSCULAR | Status: DC | PRN
  Administered 2015-03-18 (×5): 50 ug via INTRAVENOUS
  Administered 2015-03-18: 100 ug via INTRAVENOUS

## 2015-03-18 MED ORDER — LIDOCAINE HCL (CARDIAC) 20 MG/ML IV SOLN
INTRAVENOUS | Status: DC | PRN
  Administered 2015-03-18: 70 mg via INTRAVENOUS

## 2015-03-18 MED ORDER — ONDANSETRON HCL 4 MG/2ML IJ SOLN
INTRAMUSCULAR | Status: AC
  Filled 2015-03-18: qty 2

## 2015-03-18 MED ORDER — TRANEXAMIC ACID 1000 MG/10ML IV SOLN
2000.0000 mg | INTRAVENOUS | Status: DC | PRN
  Administered 2015-03-18: 2000 mg via TOPICAL

## 2015-03-18 MED ORDER — OXYCODONE HCL 5 MG PO TABS: 5.0000 mg | ORAL_TABLET | Freq: Once | ORAL | Status: DC | PRN

## 2015-03-18 MED ORDER — BUPIVACAINE HCL (PF) 0.25 % IJ SOLN
INTRAMUSCULAR | Status: AC
  Filled 2015-03-18: qty 30

## 2015-03-18 MED ORDER — ENOXAPARIN SODIUM 30 MG/0.3ML ~~LOC~~ SOLN
30.0000 mg | Freq: Two times a day (BID) | SUBCUTANEOUS | Status: DC
  Filled 2015-03-18: qty 0.3

## 2015-03-18 MED ORDER — BUPIVACAINE-EPINEPHRINE (PF) 0.25% -1:200000 IJ SOLN
INTRAMUSCULAR | Status: AC
  Filled 2015-03-18: qty 30

## 2015-03-18 MED ORDER — DEXAMETHASONE SODIUM PHOSPHATE 4 MG/ML IJ SOLN
INTRAMUSCULAR | Status: DC | PRN
  Administered 2015-03-18: 8 mg via INTRAVENOUS

## 2015-03-18 MED ORDER — CHLORHEXIDINE GLUCONATE 4 % EX LIQD: 60.0000 mL | Freq: Once | CUTANEOUS | Status: DC

## 2015-03-18 MED ORDER — VANCOMYCIN HCL 1000 MG IV SOLR
INTRAVENOUS | Status: AC
  Filled 2015-03-18: qty 2000

## 2015-03-18 MED ORDER — ROCURONIUM BROMIDE 50 MG/5ML IV SOLN
INTRAVENOUS | Status: AC
  Filled 2015-03-18: qty 1

## 2015-03-18 MED ORDER — ONDANSETRON HCL 4 MG/2ML IJ SOLN: 4.0000 mg | Freq: Four times a day (QID) | INTRAMUSCULAR | Status: DC | PRN

## 2015-03-18 MED ORDER — FUROSEMIDE 20 MG PO TABS
20.0000 mg | ORAL_TABLET | Freq: Every day | ORAL | Status: DC
  Administered 2015-03-18 – 2015-03-20 (×3): 20 mg via ORAL
  Filled 2015-03-18 (×4): qty 1

## 2015-03-18 MED ORDER — ADULT MULTIVITAMIN W/MINERALS CH
1.0000 | ORAL_TABLET | Freq: Every day | ORAL | Status: DC
  Administered 2015-03-19 – 2015-03-20 (×2): 1 via ORAL
  Filled 2015-03-18 (×2): qty 1

## 2015-03-18 MED ORDER — MAGNESIUM CITRATE PO SOLN: 1.0000 | Freq: Once | ORAL | Status: DC | PRN

## 2015-03-18 MED ORDER — METOPROLOL TARTRATE 50 MG PO TABS
50.0000 mg | ORAL_TABLET | Freq: Two times a day (BID) | ORAL | Status: DC
  Administered 2015-03-18 – 2015-03-20 (×4): 50 mg via ORAL
  Filled 2015-03-18 (×4): qty 1

## 2015-03-18 MED ORDER — ALLOPURINOL 300 MG PO TABS
300.0000 mg | ORAL_TABLET | Freq: Every day | ORAL | Status: DC
  Administered 2015-03-18 – 2015-03-20 (×3): 300 mg via ORAL
  Filled 2015-03-18 (×3): qty 1

## 2015-03-18 MED ORDER — COLCHICINE 0.6 MG PO TABS
0.6000 mg | ORAL_TABLET | Freq: Every day | ORAL | Status: DC
  Administered 2015-03-18 – 2015-03-20 (×3): 0.6 mg via ORAL
  Filled 2015-03-18 (×3): qty 1

## 2015-03-18 MED ORDER — WARFARIN SODIUM 7.5 MG PO TABS
7.5000 mg | ORAL_TABLET | Freq: Once | ORAL | Status: AC
  Administered 2015-03-18: 7.5 mg via ORAL
  Filled 2015-03-18: qty 1

## 2015-03-18 MED ORDER — DIPHENHYDRAMINE HCL 12.5 MG/5ML PO ELIX
12.5000 mg | ORAL_SOLUTION | ORAL | Status: DC | PRN
Start: 2015-03-18 — End: 2015-03-20

## 2015-03-18 MED ORDER — BUPIVACAINE-EPINEPHRINE (PF) 0.5% -1:200000 IJ SOLN
INTRAMUSCULAR | Status: DC | PRN
  Administered 2015-03-18: 30 mL via PERINEURAL

## 2015-03-18 MED ORDER — METHOCARBAMOL 500 MG PO TABS: 500.0000 mg | ORAL_TABLET | Freq: Three times a day (TID) | ORAL | Status: DC | PRN

## 2015-03-18 MED ORDER — PHENOL 1.4 % MT LIQD: 1.0000 | OROMUCOSAL | Status: DC | PRN

## 2015-03-18 MED ORDER — BUPIVACAINE HCL 0.25 % IJ SOLN
INTRAMUSCULAR | Status: DC | PRN
  Administered 2015-03-18: 20 mL

## 2015-03-18 MED ORDER — HYDROMORPHONE HCL 1 MG/ML IJ SOLN: 0.5000 mg | INTRAMUSCULAR | Status: DC | PRN

## 2015-03-18 MED ORDER — POLYETHYLENE GLYCOL 3350 17 G PO PACK: 17.0000 g | PACK | Freq: Every day | ORAL | Status: DC | PRN

## 2015-03-18 MED ORDER — MIDAZOLAM HCL 5 MG/5ML IJ SOLN
INTRAMUSCULAR | Status: DC | PRN
Start: 2015-03-18 — End: 2015-03-18
  Administered 2015-03-18: 1 mg via INTRAVENOUS

## 2015-03-18 MED ORDER — ALUM & MAG HYDROXIDE-SIMETH 200-200-20 MG/5ML PO SUSP
30.0000 mL | ORAL | Status: DC | PRN
Start: 2015-03-18 — End: 2015-03-20

## 2015-03-18 MED ORDER — POTASSIUM CHLORIDE ER 10 MEQ PO TBCR
10.0000 meq | EXTENDED_RELEASE_TABLET | Freq: Every day | ORAL | Status: DC
  Administered 2015-03-18 – 2015-03-20 (×3): 10 meq via ORAL
  Filled 2015-03-18 (×3): qty 1

## 2015-03-18 MED ORDER — ACETAMINOPHEN 10 MG/ML IV SOLN
1000.0000 mg | Freq: Once | INTRAVENOUS | Status: AC
  Administered 2015-03-18: 1000 mg via INTRAVENOUS

## 2015-03-18 MED ORDER — ONDANSETRON HCL 4 MG PO TABS: 4.0000 mg | ORAL_TABLET | Freq: Four times a day (QID) | ORAL | Status: DC | PRN

## 2015-03-18 MED ORDER — LISINOPRIL 40 MG PO TABS
40.0000 mg | ORAL_TABLET | Freq: Every day | ORAL | Status: DC
  Administered 2015-03-19 – 2015-03-20 (×2): 40 mg via ORAL
  Filled 2015-03-18 (×2): qty 1

## 2015-03-18 MED ORDER — OXYCODONE HCL 5 MG/5ML PO SOLN: 5.0000 mg | Freq: Once | ORAL | Status: DC | PRN

## 2015-03-18 MED ORDER — KETOROLAC TROMETHAMINE 15 MG/ML IJ SOLN
7.5000 mg | Freq: Four times a day (QID) | INTRAMUSCULAR | Status: AC
  Administered 2015-03-18 – 2015-03-19 (×4): 7.5 mg via INTRAVENOUS
  Filled 2015-03-18 (×3): qty 1

## 2015-03-18 MED ORDER — MENTHOL 3 MG MT LOZG: 1.0000 | LOZENGE | OROMUCOSAL | Status: DC | PRN

## 2015-03-18 MED ORDER — ACETAMINOPHEN 10 MG/ML IV SOLN
INTRAVENOUS | Status: AC
  Filled 2015-03-18: qty 100

## 2015-03-18 MED ORDER — ONDANSETRON HCL 4 MG/2ML IJ SOLN
INTRAMUSCULAR | Status: DC | PRN
  Administered 2015-03-18: 4 mg via INTRAVENOUS

## 2015-03-18 MED ORDER — PROPOFOL 10 MG/ML IV BOLUS
INTRAVENOUS | Status: DC | PRN
  Administered 2015-03-18: 120 mg via INTRAVENOUS

## 2015-03-18 MED ORDER — LIDOCAINE HCL (CARDIAC) 20 MG/ML IV SOLN
INTRAVENOUS | Status: AC
  Filled 2015-03-18: qty 5

## 2015-03-18 MED ORDER — FENTANYL CITRATE (PF) 250 MCG/5ML IJ SOLN
INTRAMUSCULAR | Status: AC
  Filled 2015-03-18: qty 5

## 2015-03-18 MED ORDER — ONDANSETRON HCL 4 MG/2ML IJ SOLN: 4.0000 mg | Freq: Once | INTRAMUSCULAR | Status: DC | PRN

## 2015-03-18 MED ORDER — METOCLOPRAMIDE HCL 5 MG/ML IJ SOLN: 5.0000 mg | Freq: Three times a day (TID) | INTRAMUSCULAR | Status: DC | PRN

## 2015-03-18 MED ORDER — KETOROLAC TROMETHAMINE 15 MG/ML IJ SOLN
INTRAMUSCULAR | Status: AC
  Filled 2015-03-18: qty 1

## 2015-03-18 MED ORDER — HYDROMORPHONE HCL 1 MG/ML IJ SOLN: 0.2500 mg | INTRAMUSCULAR | Status: DC | PRN

## 2015-03-18 MED ORDER — SIMVASTATIN 40 MG PO TABS
40.0000 mg | ORAL_TABLET | Freq: Every day | ORAL | Status: DC
  Administered 2015-03-18 – 2015-03-19 (×2): 40 mg via ORAL
  Filled 2015-03-18 (×2): qty 1

## 2015-03-18 MED ORDER — TRANEXAMIC ACID 1000 MG/10ML IV SOLN
2000.0000 mg | Freq: Once | INTRAVENOUS | Status: DC
  Filled 2015-03-18: qty 20

## 2015-03-18 MED ORDER — DOXAZOSIN MESYLATE 8 MG PO TABS
8.0000 mg | ORAL_TABLET | Freq: Every day | ORAL | Status: DC
  Administered 2015-03-18 – 2015-03-19 (×2): 8 mg via ORAL
  Filled 2015-03-18 (×3): qty 1

## 2015-03-18 MED ORDER — VANCOMYCIN HCL 1000 MG IV SOLR
INTRAVENOUS | Status: DC | PRN
  Administered 2015-03-18 (×2): 2000 mg

## 2015-03-18 MED ORDER — BISACODYL 10 MG RE SUPP: 10.0000 mg | Freq: Every day | RECTAL | Status: DC | PRN

## 2015-03-18 MED ORDER — SODIUM CHLORIDE 0.9 % IR SOLN
Status: DC | PRN
  Administered 2015-03-18: 1000 mL

## 2015-03-18 MED ORDER — POLYETHYLENE GLYCOL 3350 17 G PO PACK
17.0000 g | PACK | Freq: Every day | ORAL | Status: DC
  Administered 2015-03-18 – 2015-03-20 (×3): 17 g via ORAL
  Filled 2015-03-18 (×3): qty 1

## 2015-03-18 MED ORDER — KETOROLAC TROMETHAMINE 15 MG/ML IJ SOLN: 15.0000 mg | Freq: Four times a day (QID) | INTRAMUSCULAR | Status: DC

## 2015-03-18 MED ORDER — PANTOPRAZOLE SODIUM 40 MG PO TBEC
40.0000 mg | DELAYED_RELEASE_TABLET | Freq: Every day | ORAL | Status: DC
  Administered 2015-03-18 – 2015-03-20 (×3): 40 mg via ORAL
  Filled 2015-03-18 (×3): qty 1

## 2015-03-18 MED ORDER — METOCLOPRAMIDE HCL 5 MG PO TABS
5.0000 mg | ORAL_TABLET | Freq: Three times a day (TID) | ORAL | Status: DC | PRN
Start: 2015-03-18 — End: 2015-03-20

## 2015-03-18 MED ORDER — PROPOFOL 10 MG/ML IV BOLUS
INTRAVENOUS | Status: AC
  Filled 2015-03-18: qty 20

## 2015-03-18 SURGICAL SUPPLY — 102 items
ADAPTER BOLT FEMORAL +2/-2 (Knees) ×2 IMPLANT
ADPR FEM +2/-2 OFST BOLT (Knees) ×1 IMPLANT
ADPR FEM 5D STRL KN PFC SGM (Orthopedic Implant) ×1 IMPLANT
AUG FEM 4 4 CMB LF KN POST (Knees) ×1 IMPLANT
AUG FEM SZ4 4 STRL LF KN LT TI (Knees) ×1 IMPLANT
AUG FEM SZ4 8 CMB POST STRL LF (Orthopedic Implant) ×1 IMPLANT
AUG FEM SZ4 8 STRL LF KN LT TI (Knees) ×1 IMPLANT
AUG TIB SZ2 15 REV STP WDG (Knees) IMPLANT
AUG TIB SZ2 5 REV STP WDG STRL (Knees) IMPLANT
AUG TIB SZ5 5 REV STP WDG STRL (Knees) ×1 IMPLANT
AUGMENT FEM SZ4 4 LT DIST PFC (Knees) ×2 IMPLANT
AUGMENT FMRL PST PFC SIG SZ4 8 (Orthopedic Implant) IMPLANT
AUGMENT POSTERIOR PFC (Knees) IMPLANT
AUGMENT POSTERIOR PFC SZ4 4MM (Knees) IMPLANT
BAG DECANTER FOR FLEXI CONT (MISCELLANEOUS) ×2 IMPLANT
BANDAGE ESMARK 6X9 LF (GAUZE/BANDAGES/DRESSINGS) ×1 IMPLANT
BLADE SAGITTAL 25.0X1.19X90 (BLADE) ×2 IMPLANT
BLADE SAGITTAL 25.0X1.19X90MM (BLADE) ×1
BLADE SAW SGTL 13.0X1.19X90.0M (BLADE) ×3 IMPLANT
BLADE SAW SGTL 81X20 HD (BLADE) ×4 IMPLANT
BLADE SURG 10 STRL SS (BLADE) ×4 IMPLANT
BNDG CMPR 9X6 STRL LF SNTH (GAUZE/BANDAGES/DRESSINGS) ×1
BNDG ESMARK 6X9 LF (GAUZE/BANDAGES/DRESSINGS) ×3
BOWL SMART MIX CTS (DISPOSABLE) ×2 IMPLANT
CEMENT HV SMART SET (Cement) ×10 IMPLANT
CEMENT RESTRICTOR DEPUY SZ 4 (Cement) ×2 IMPLANT
COMP FEM CEM LT SZ4 (Orthopedic Implant) ×3 IMPLANT
COMPONENT FEM CEM LT SZ4 (Orthopedic Implant) IMPLANT
CONT SPEC 4OZ CLIKSEAL STRL BL (MISCELLANEOUS) ×4 IMPLANT
COVER MAYO STAND STRL (DRAPES) ×2 IMPLANT
COVER SURGICAL LIGHT HANDLE (MISCELLANEOUS) ×3 IMPLANT
CUFF TOURNIQUET SINGLE 34IN LL (TOURNIQUET CUFF) ×2 IMPLANT
CUFF TOURNIQUET SINGLE 44IN (TOURNIQUET CUFF) IMPLANT
DRAPE C-ARM 42X72 X-RAY (DRAPES) ×2 IMPLANT
DRAPE EXTREMITY T 121X128X90 (DRAPE) ×3 IMPLANT
DRAPE IMP U-DRAPE 54X76 (DRAPES) ×3 IMPLANT
DRSG ADAPTIC 3X8 NADH LF (GAUZE/BANDAGES/DRESSINGS) ×3 IMPLANT
DRSG PAD ABDOMINAL 8X10 ST (GAUZE/BANDAGES/DRESSINGS) ×6 IMPLANT
DURAPREP 26ML APPLICATOR (WOUND CARE) ×3 IMPLANT
ELECT REM PT RETURN 9FT ADLT (ELECTROSURGICAL) ×3
ELECTRODE REM PT RTRN 9FT ADLT (ELECTROSURGICAL) ×1 IMPLANT
EVACUATOR 1/8 PVC DRAIN (DRAIN) ×2 IMPLANT
FACESHIELD WRAPAROUND (MASK) ×6 IMPLANT
FACESHIELD WRAPAROUND OR TEAM (MASK) ×2 IMPLANT
FEM POST AUG PFC SIGMA SZ4 8MM (Orthopedic Implant) ×3 IMPLANT
FEMORAL ADAPTER (Orthopedic Implant) ×2 IMPLANT
GAUZE SPONGE 4X4 12PLY STRL (GAUZE/BANDAGES/DRESSINGS) ×3 IMPLANT
GLOVE BIOGEL PI IND STRL 8 (GLOVE) ×1 IMPLANT
GLOVE BIOGEL PI IND STRL 8.5 (GLOVE) IMPLANT
GLOVE BIOGEL PI INDICATOR 8 (GLOVE) ×2
GLOVE BIOGEL PI INDICATOR 8.5 (GLOVE)
GLOVE ECLIPSE 8.0 STRL XLNG CF (GLOVE) ×3 IMPLANT
GLOVE SURG ORTHO 8.5 STRL (GLOVE) ×3 IMPLANT
GOWN STRL REUS W/ TWL LRG LVL3 (GOWN DISPOSABLE) ×2 IMPLANT
GOWN STRL REUS W/ TWL XL LVL3 (GOWN DISPOSABLE) ×1 IMPLANT
GOWN STRL REUS W/TWL 2XL LVL3 (GOWN DISPOSABLE) ×3 IMPLANT
GOWN STRL REUS W/TWL LRG LVL3 (GOWN DISPOSABLE) ×6
GOWN STRL REUS W/TWL XL LVL3 (GOWN DISPOSABLE) ×3
HANDPIECE INTERPULSE COAX TIP (DISPOSABLE) ×3
INSERT TC3 SZ4 4X17.5MM (Knees) ×2 IMPLANT
KIT BASIN OR (CUSTOM PROCEDURE TRAY) ×3 IMPLANT
KIT ROOM TURNOVER OR (KITS) ×3 IMPLANT
MANIFOLD NEPTUNE II (INSTRUMENTS) ×3 IMPLANT
NEEDLE 22X1 1/2 (OR ONLY) (NEEDLE) IMPLANT
NS IRRIG 1000ML POUR BTL (IV SOLUTION) ×3 IMPLANT
PACK TOTAL JOINT (CUSTOM PROCEDURE TRAY) ×3 IMPLANT
PACK UNIVERSAL I (CUSTOM PROCEDURE TRAY) ×3 IMPLANT
PAD ARMBOARD 7.5X6 YLW CONV (MISCELLANEOUS) ×8 IMPLANT
PAD CAST 4YDX4 CTTN HI CHSV (CAST SUPPLIES) ×1 IMPLANT
PADDING CAST COTTON 4X4 STRL (CAST SUPPLIES) ×3
PADDING CAST COTTON 6X4 STRL (CAST SUPPLIES) ×2 IMPLANT
PATELLA LRG REPLACEMENT (Knees) ×2 IMPLANT
POSTERIOR AUGMENT PFC SZ4 4MM (Knees) ×3 IMPLANT
POSTIER AUGMENT PFC (Knees) ×3 IMPLANT
RUBBERBAND STERILE (MISCELLANEOUS) ×2 IMPLANT
SET HNDPC FAN SPRY TIP SCT (DISPOSABLE) ×1 IMPLANT
SLEEVE UNIV FEM DIST PRO SZ 31 (Sleeve) ×2 IMPLANT
SPONGE LAP 18X18 X RAY DECT (DISPOSABLE) ×4 IMPLANT
STAPLER VISISTAT 35W (STAPLE) ×3 IMPLANT
STEM TIBIA PFC 13X60MM (Stem) ×2 IMPLANT
STEM UNIVERSAL REVISION 75X18 (Stem) ×2 IMPLANT
SUCTION FRAZIER TIP 10 FR DISP (SUCTIONS) ×3 IMPLANT
SUT BONE WAX W31G (SUTURE) ×3 IMPLANT
SUT ETHIBOND NAB CT1 #1 30IN (SUTURE) ×8 IMPLANT
SUT MNCRL AB 3-0 PS2 18 (SUTURE) ×2 IMPLANT
SUT VIC AB 0 CT1 27 (SUTURE) ×3
SUT VIC AB 0 CT1 27XBRD ANBCTR (SUTURE) ×1 IMPLANT
SUT VIC AB 2-0 FS1 27 (SUTURE) ×4 IMPLANT
SWAB COLLECTION DEVICE MRSA (MISCELLANEOUS) ×2 IMPLANT
SYR CONTROL 10ML LL (SYRINGE) ×2 IMPLANT
TOWER CARTRIDGE SMART MIX (DISPOSABLE) ×3 IMPLANT
TRAY FOLEY CATH 16FRSI W/METER (SET/KITS/TRAYS/PACK) ×3 IMPLANT
TRAY SLEEVE CEM ML (Knees) ×2 IMPLANT
TRAY TIB SZ 5 REVISION (Knees) ×2 IMPLANT
TUBE ANAEROBIC SPECIMEN COL (MISCELLANEOUS) ×2 IMPLANT
WATER STERILE IRR 1000ML POUR (IV SOLUTION) ×3 IMPLANT
WEDGE STEP SZ 2 15MM (Knees) IMPLANT
WEDGE SZ 2.0MM 5MM (Knees) IMPLANT
WEDGE SZ 5 5MM (Knees) ×2 IMPLANT
WEDGE SZ 5 5MM15MM ×2 IMPLANT
WRAP KNEE MAXI GEL POST OP (GAUZE/BANDAGES/DRESSINGS) ×2 IMPLANT
YANKAUER SUCT BULB TIP NO VENT (SUCTIONS) ×2 IMPLANT

## 2015-03-18 NOTE — H&P (Signed)
  The recent History & Physical has been reviewed. I have personally examined the patient today. There is no interval change to the documented History & Physical. The patient would like to proceed with the procedure.  [name] W 03/18/2015,  7:08 AM

## 2015-03-18 NOTE — Progress Notes (Signed)
Utilization review completed.  

## 2015-03-18 NOTE — Plan of Care (Signed)
Problem: Consults Goal: Diagnosis- Total Joint Replacement Revision Total Knee: Left     

## 2015-03-18 NOTE — Op Note (Signed)
PATIENT ID:      [name]  MRN:     [id number] DOB/AGE:    01/21/1922 / 81 y.o.       OPERATIVE REPORT    DATE OF PROCEDURE:  03/18/2015       PREOPERATIVE DIAGNOSIS:PAINFUL,   LOOSENED LEFT TOTAL KNEE ARTHROPLASTY WITH FIXED VARUS POSITION                                                       Estimated body mass index is 31.47 kg/(m^2) as calculated from the following:   Height as of this encounter:  (1.702 m).   Weight as of this encounter: 91.173 kg (201 lb).     POSTOPERATIVE DIAGNOSIS; PAINFUL, LOOSENED LEFT TOTAL KNEE ARTHROPLASTY -SAME                                                                    Estimated body mass index is 31.47 kg/(m^2) as calculated from the following:   Height as of this encounter:  (1.702 m).   Weight as of this encounter: 91.173 kg (201 lb).     PROCEDURE:  Procedure(s):LEFTTOTAL KNEE REVISION     SURGEON:  [name], MD    ASSISTANT:   Jacqualine Code, PA-C   (Present and scrubbed throughout the case, critical for assistance with exposure, retraction, instrumentation, and closure.)          ANESTHESIA: regional and general     DRAINS: (LEFT KNEE) Hemovact drain(s) in the CLAMPED with  Suction Clamped :      TOURNIQUET TIME:  Total Tourniquet Time Documented: Thigh (Left) - 119 minutes Thigh (Left) - 56 minutes Total: Thigh (Left) - 175 minutes     COMPLICATIONS:  None   CONDITION:  stable  PROCEDURE IN DETAIL: [id number]   James [name], James [name] 03/18/2015, 12:24 PM

## 2015-03-18 NOTE — Anesthesia Procedure Notes (Addendum)
Anesthesia Regional Block:  Femoral nerve block  Pre-Anesthetic Checklist: ,, timeout performed, Correct Patient, Correct Site, Correct Laterality, Correct Procedure, Correct Position, site marked, Risks and benefits discussed,  Surgical consent,  Pre-op evaluation,  At surgeon's request and post-op pain management  Laterality: Left  Prep: chloraprep       Needles:  Injection technique: Single-shot  Needle Type: Echogenic Stimulator Needle     Needle Length: 9cm 9 cm Needle Gauge: 21 and 21 G    Additional Needles:  Procedures: ultrasound guided (picture in chart) and nerve stimulator Femoral nerve block  Nerve Stimulator or Paresthesia:  Response: quadraceps contraction, 0.45 mA,   Additional Responses:   Narrative:  Start time: 03/18/2015 7:10 AM End time: 03/18/2015 7:25 AM Injection made incrementally with aspirations every 5 mL.  Performed by: Personally  Anesthesiologist: Marcene Duos  Additional Notes: Risks and benefits discussed. Pt tolerated well with no immediate complications.      Procedure Name: Intubation Date/Time: 03/18/2015 7:43 AM Performed by: Charm Barges, Khayman Kirsch R Pre-anesthesia Checklist: Patient identified, Emergency Drugs available, Suction available, Patient being monitored and Timeout performed Patient Re-evaluated:Patient Re-evaluated prior to inductionOxygen Delivery Method: Circle system utilized Preoxygenation: Pre-oxygenation with 100% oxygen Intubation Type: IV induction Ventilation: Mask ventilation without difficulty Laryngoscope Size: Mac and 4 Grade View: Grade II Tube type: Oral Tube size: 7.5 mm Number of attempts: 1 Airway Equipment and Method: Stylet Placement Confirmation: ETT inserted through vocal cords under direct vision,  positive ETCO2 and breath sounds checked- equal and bilateral Secured at: 22 cm Tube secured with: Tape Dental Injury: Teeth and Oropharynx as per pre-operative assessment

## 2015-03-18 NOTE — Progress Notes (Signed)
ANTICOAGULATION CONSULT NOTE - Initial Consult  Pharmacy Consult for Coumadin Indication: PE history / CVA history / s/p left total knee revision  Allergies  Allergen Reactions  . No Known Allergies     Patient Measurements: Height:  (170.2 cm) Weight: 201 lb (91.173 kg) IBW/kg (Calculated) : 66.1  Vital Signs: Temp: 98.7 F (37.1 C) (09/20 1444) Temp Source: Oral (09/20 1444) BP: 174/89 mmHg (09/20 1444) Pulse Rate: 75 (09/20 1444)  Labs:  Recent Labs  03/18/15 0632 03/18/15 1320  HGB  --  11.7*  HCT  --  35.5*  PLT  --  148*  LABPROT 18.3*  --   INR 1.51*  --   CREATININE  --  1.22    Estimated Creatinine Clearance: 42.5 mL/min (by C-G formula based on Cr of 1.22).   Medical History: Past Medical History  Diagnosis Date  . Atrial fibrillation, chronic     Permanent atrial fibrillation  . Anemia     H&H of 12.4/38.5 with MCV of 110 in /2012  . Hypertension     Lab 05/2011: Normal CMet except glucose of 121 and creatinine of 1.37  . TOTAL KNEE REVISIONLeft   . Hyperlipidemia     Lipid profile in 05/2011:138, 120, 34, 80  . Cerebrovascular disease     Left posterior parietal infarction  . Chronic anticoagulation     H&H of 12.4/38.5 in 05/2011; negative stool Hemoccults in 05/2009  . Fasting hyperglycemia     Glucose values of 70-135 in 2010-2012  . Herpes zoster 2011  . Dysrhythmia   . Stroke   . Pulmonary embolism   . Deep vein thrombosis (DVT)   . History of kidney stones   . GERD (gastroesophageal reflux disease)   . Arthritis     Assessment: 81 year old male on Coumadin prior to admission now s/p total knee revision Dose PTA = 5 mg po daily  INR today = 1.51 On Lovenox 30 mg sq Q 12 hours until Coumadin therapeutic   Goal of Therapy:  INR 2-3 Monitor platelets by anticoagulation protocol: Yes   Plan:  Coumadin 7.5 mg po x 1 today Daily INR  Thank you Okey Regal, PharmD [phone]  03/18/2015,2:56 PM

## 2015-03-18 NOTE — Transfer of Care (Signed)
Immediate Anesthesia Transfer of Care Note  Patient: James [name]  Procedure(s) Performed: Procedure(s): TOTAL KNEE REVISION (Left)  Patient Location: PACU  Anesthesia Type:GA combined with regional for post-op pain  Level of Consciousness: awake and patient cooperative  Airway & Oxygen Therapy: Patient Spontanous Breathing and Patient connected to nasal cannula oxygen  Post-op Assessment: Report given to RN, Post -op Vital signs reviewed and stable and Patient moving all extremities  Post vital signs: Reviewed and stable  Last Vitals:  Filed Vitals:   03/18/15 0551  BP: 208/89  Pulse: 69  Temp: 36.6 C  Resp: 20    Complications: No apparent anesthesia complications

## 2015-03-18 NOTE — Anesthesia Preprocedure Evaluation (Addendum)
Anesthesia Evaluation  Patient identified by MRN, date of birth, ID band Patient awake    Reviewed: Allergy & Precautions, NPO status , Patient's Chart, lab work & pertinent test results, reviewed documented beta blocker date and time   Airway Mallampati: II  TM Distance: >3 FB Neck ROM: Full    Dental  (+) Dental Advisory Given   Pulmonary neg pulmonary ROS,    breath sounds clear to auscultation       Cardiovascular hypertension, Pt. on medications and Pt. on home beta blockers + dysrhythmias Atrial Fibrillation  Rhythm:Irregular Rate:Normal     Neuro/Psych CVA    GI/Hepatic GERD  ,  Endo/Other  negative endocrine ROS  Renal/GU CRFRenal disease     Musculoskeletal  (+) Arthritis ,   Abdominal   Peds  Hematology negative hematology ROS (+)   Anesthesia Other Findings   Reproductive/Obstetrics                            Anesthesia Physical Anesthesia Plan  ASA: III  Anesthesia Plan: General and Regional   Post-op Pain Management: GA combined w/ Regional for post-op pain   Induction: Intravenous  Airway Management Planned: Oral ETT  Additional Equipment:   Intra-op Plan:   Post-operative Plan: Extubation in OR  Informed Consent: I have reviewed the patients History and Physical, chart, labs and discussed the procedure including the risks, benefits and alternatives for the proposed anesthesia with the patient or authorized representative who has indicated his/her understanding and acceptance.   Dental advisory given  Plan Discussed with: CRNA  Anesthesia Plan Comments:        Anesthesia Quick Evaluation

## 2015-03-18 NOTE — Anesthesia Postprocedure Evaluation (Signed)
  Anesthesia Post-op Note  Patient: James [name]  Procedure(s) Performed: Procedure(s): TOTAL KNEE REVISION (Left)  Patient Location: PACU  Anesthesia Type:General and GA combined with regional for post-op pain  Level of Consciousness: awake, alert  and oriented  Airway and Oxygen Therapy: Patient Spontanous Breathing and Patient connected to nasal cannula oxygen  Post-op Pain: mild  Post-op Assessment: Post-op Vital signs reviewed, Patient's Cardiovascular Status Stable, Respiratory Function Stable, Patent Airway and Pain level controlled LLE Motor Response: Purposeful movement, Responds to commands (wiggles toes) LLE Sensation: Decreased, Numbness (d/t block)          Post-op Vital Signs: stable  Last Vitals:  Filed Vitals:   03/18/15 1444  BP: 174/89  Pulse: 75  Temp: 37.1 C  Resp:     Complications: No apparent anesthesia complications

## 2015-03-18 NOTE — Evaluation (Signed)
Physical Therapy Evaluation Patient Details Name: James [name] MRN: [id number] DOB: 07-04-1921 Today's Date: 03/18/2015   History of Present Illness  Pt is a 81 y/o M s/p Lt total knee revision 2/2 loosening.  Pt's PMH includes mini-stroke several years ago, a fib, anemia, HTN, dysrhythmia, DVT, PE.  Clinical Impression  Patient is s/p above surgery resulting in functional limitations due to the deficits listed below (see PT Problem List). Sitting EOB pt reports that "my foot isn't touching the floor" when in fact it was.  Concerned that nerve block has not yet worn off and therefore transfer was not attempted this session.  Pt lives alone w/ wife who is not physically able to provided assist pt will need upon d/c and pt will need to go to SNF before returning home. Patient will benefit from skilled PT to increase their independence and safety with mobility to allow discharge to the venue listed below.      Follow Up Recommendations SNF    Equipment Recommendations  Other (comment) (TBD at next venue of care)    Recommendations for Other Services       Precautions / Restrictions Precautions Precautions: Knee;Fall Precaution Booklet Issued: Yes (comment) Precaution Comments: Reviewed knee precautions Restrictions Weight Bearing Restrictions: Yes LLE Weight Bearing: Partial weight bearing LLE Partial Weight Bearing Percentage or Pounds: 50-75%      Mobility  Bed Mobility Overal bed mobility: Needs Assistance Bed Mobility: Supine to Sit;Sit to Supine     Supine to sit: Min assist;HOB elevated Sit to supine: Min assist   General bed mobility comments: Min assist managing Lt LE and pt uses bed rails heavily.  Cues for technique and pt requires increased time.  Transfers                 General transfer comment: Sitting EOB pt reports that "my foot isn't touching the floor" when in fact it was.  Concerned that nerve block has not yet worn off and therefore transfer was  not attempted this session.  Ambulation/Gait                Stairs            Wheelchair Mobility    Modified Rankin (Stroke Patients Only)       Balance Overall balance assessment: Needs assistance Sitting-balance support: Bilateral upper extremity supported;Feet supported Sitting balance-Leahy Scale: Fair                                       Pertinent Vitals/Pain Pain Assessment: No/denies pain Pain Intervention(s): Limited activity within patient's tolerance;Monitored during session    Home Living Family/patient expects to be discharged to:: Skilled nursing facility                 Additional Comments: Has WC he used to get to the car and back w/ wife pushing.  Has RW and rollator    Prior Function Level of Independence: Needs assistance   Gait / Transfers Assistance Needed: PTA wife assisted husband w/ tub/shower transfers.  Using RW at all times.  ADL's / Homemaking Assistance Needed: Wife assist pt w/ bathing, dressing.         Hand Dominance        Extremity/Trunk Assessment   Upper Extremity Assessment: Generalized weakness           Lower Extremity Assessment: Generalized weakness;LLE deficits/detail  LLE Deficits / Details: s/p Lt total knee revision w/ expected weakness and ROM limitations     Communication   Communication: HOH  Cognition Arousal/Alertness: Awake/alert Behavior During Therapy: Flat affect Overall Cognitive Status: Within Functional Limits for tasks assessed                      General Comments      Exercises Total Joint Exercises Ankle Circles/Pumps: AROM;Both;10 reps;Supine Quad Sets: Strengthening;Both;10 reps;Supine Straight Leg Raises: AAROM;Left;5 reps;Supine;AROM      Assessment/Plan    PT Assessment Patient needs continued PT services  PT Diagnosis Difficulty walking;Abnormality of gait;Generalized weakness;Acute pain   PT Problem List Decreased  strength;Decreased range of motion;Decreased activity tolerance;Decreased balance;Decreased mobility;Decreased coordination;Decreased knowledge of use of DME;Decreased safety awareness;Decreased knowledge of precautions;Impaired sensation;Decreased skin integrity;Pain  PT Treatment Interventions DME instruction;Gait training;Stair training;Functional mobility training;Balance training;Therapeutic activities;Therapeutic exercise;Neuromuscular re-education;Patient/family education;Modalities;Wheelchair mobility training   PT Goals (Current goals can be found in the Care Plan section) Acute Rehab PT Goals Patient Stated Goal: to be able to stand up PT Goal Formulation: With patient/family Time For Goal Achievement: 04/01/15 Potential to Achieve Goals: Good    Frequency 7X/week   Barriers to discharge Decreased caregiver support Pt lives alone w/ wife who is not physically able to provided assist pt will need upon d/c    Co-evaluation               End of Session Equipment Utilized During Treatment: Gait belt Activity Tolerance: Other (comment);Patient tolerated treatment well (limited by effects of nerve block ) Patient left: in bed;with call bell/phone within reach;with family/visitor present Nurse Communication: Mobility status;Precautions;Weight bearing status         Time: 1610-9604 PT Time Calculation (min) (ACUTE ONLY): 40 min   Charges:   PT Evaluation $Initial PT Evaluation Tier I: 1 Procedure PT Treatments $Therapeutic Exercise: 8-22 mins $Therapeutic Activity: 8-22 mins   PT G Codes:       Michail Jewels PT, DPT [phone] Pager: [phone] 03/18/2015, 4:28 PM

## 2015-03-18 NOTE — Op Note (Signed)
NAME:  James [name], James [name] NO.:  [id number]  MEDICAL RECORD NO.:  [id number]  LOCATION:  [id number]                        FACILITY:  [facility]  PHYSICIAN:  Vonna Kotyk. Whitfield, M.D.DATE OF BIRTH:  08/04/1921  DATE OF PROCEDURE:  03/18/2015 DATE OF DISCHARGE:                              OPERATIVE REPORT   PREOPERATIVE DIAGNOSIS:  Painful loosened left total knee replacement with a fixed varus position.  POSTOPERATIVE DIAGNOSIS:  Painful loosened left total knee replacement with a fixed varus position.  PROCEDURE:  Revision left total knee replacement.  SURGEON:  Vonna Kotyk. Durward Fortes, M.D.  ASSISTANT:  Aaron Edelman D. Petrarca, PA-C  ANESTHESIA:  General with supplemental and femoral nerve block.  COMPLICATIONS:  None.  COMPONENTS:  On the tibial side, it was a size 5 cemented MBT revision rotating platform with a 13 mm x 60 mm modular stem.  I used a 15 mm step wedge medially and a 5 mm step wedge laterally and a 29 mm metaphyseal sleeve that was also cemented.  I used a 17.5 mm rotating polyethylene component.  I replaced the polyethylene portion of the existing patella and on the femoral side, I used a #4 cemented femoral component with a 75 mm long x 18 mm wide stem.  I used a distal augment laterally 4 mm and a 4 mm posterior augment laterally and on the medial side, I used an 8 mm posterior augment and an 8 mm distal augment.  All the components were secured with polymethyl methacrylate with added vancomycin.  DESCRIPTION OF PROCEDURE:  Mr. Beevers was met in the holding area, identified the left knee as appropriate operative site.  Did have a preoperative femoral nerve block per Anesthesia.  I discussed the situation with the family which I had done even preoperatively in detail.  I marked his left leg as appropriate operative site and after receiving a femoral nerve block, the patient was then transported to room #7, where he was placed under general anesthesia  without difficulty.  Nursing staff inserted a Foley catheter.  Urine was clear. Time-out was called.  Left lower extremity was then placed in a thigh tourniquet.  The leg was prepped with chlorhexidine scrub and DuraPrep x2.  Sterile draping was performed.  With the extremity elevated, it was Esmarch exsanguinated with a proximal tourniquet at 350 mmHg.  The prior longitudinal knee incision was utilized and via sharp dissection, carried out in the subcutaneous tissue.  First layer of capsule was incised in the midline and medial parapatellar incision was made through the old incisional area as outlined by the nonabsorbable Ethibond sutures.  Cut sutures were then removed.  The joint was entered.  There was a somewhat brownish fluid.  This was sent for culture and sensitivity.  The patella was everted 180 degrees.  The knee then flexed to 90 degrees.  There was a fixed varus position and also a mild flexed flexure contracture.  There was abundant reactive synovitis that appeared to be related to polyethylene wear.  There was some grayish discoloration.  A radical synovectomy was performed.  I did send deep specimens to Pathology for white cells per high-powered field and the report  was that there were none identified, so I proceeded with revision of the components.  The tibia was obviously loose.  There had been bone collapse along the medial tibial plateau, thus the etiology of the fixed varus position. Using osteotomes, I carefully undermined the tibial tray and then was able to remove it after osteotomizing the stem of the polyethylene bearing and removing it.  There was considerable fragmentation of the methacrylate.  Methacrylate was removed with a small osteotome.  I then evaluated the femur.  There was considerable synovial about the edges of the component and to some extent, there was erosion, particularly distally and posteriorly, and concerned that the femur was loose.  I  proceeded with removing the femur, thus the entire revision of all 3 components.  Using a small oscillating saw and a small osteotome, we carefully undermined the femur at the bone methacrylate interface and then removed it with a little if any bone loss on the component.  Extraneous methacrylate was removed.  Further synovitis was also resected.  MCL and LCL remained intact.  We initially approached the tibia.  My hand reamed the tibial canal to a 15, so that we eventually would insert a 13 mm stem with cement and then inserted the transverse cutting guide.  I made a very minimal cut on the proximal tibia.  There was approximately 15 mm of bone loss between the medial tibial plateau.  The medial portion of the tibial component and the existing bone.  I was able to use some bone graft from both the femur and the tibia which was impacted and the remaining portion was supplemented with a 15 mm wedge applied to the tibial component.  I measured a #5 tibial tray.  As mentioned, there was considerable bone loss medially, but there was at least a rim of bone anteriorly and medially and somewhat posteromedially, but directly medially, there was very little if any bone, and I thought I had a very good purchase of the tibial tray, particularly using the longer fluted stem, i.e. 60 mm.  The trial tibial components were then removed.  The femur was approached.  Any further soft tissue was removed from its periphery.  We measured a #4 femoral component, thought it was a very good fit.  The cutting jigs were applied to the femur to obtain the tapered cuts.  We measured augments distally and posteriorly on the femur as mentioned above.  I hand reamed the femoral canal to an 18.  There was enough bone loss that I felt that sleeve would be important for stability and accordingly, we tapped a 31 sleeve.  I did not mention that we also used a sleeve on the tibia, measuring 29 mm, which also helped  considerably with stability and this was part of the trial.  After all the above cuts, we constructed the femoral and tibial trial components on the side table and these were positioned in place.  We initially tried a 15 and then a 17.5 mm polyethylene component, felt that the 17 gave [place] excellent stability.  As mentioned, MCL was intact. Gave [place] full extension and over 100 degrees of flexion.  At that point, about nearly 2 hours on the tourniquet, the tourniquet was released and there was immediate capillary refill to the joint.  We copiously irrigated the joint with saline solution.  The tourniquet was down for approximately 40 minutes, during which time, the joint was irrigated.  We removed any further soft tissue and then  bone grafted the tibia.  The final components were constructed in the side table and then impacted with polymethyl methacrylate.  We initially inserted the tibial tray.  Using the glue gun, I did insert a cement restrictor in the tibia and then cemented the entire construct to give [place] immediate stability for weightbearing purposes.  Then, we inserted #5 tibial tray with a 29 mm sleeve, 15 mm augment medially, and 5 mm laterally.  Methacrylate was impacted.  We made sure we had appropriate alignment and maintained the position while methacrylate matured.  Any extraneous methacrylate was removed from the periphery of the components.  At approximately 16 minutes, methacrylate was hard and the component appeared to be very stable.  At that point, we inserted the final femoral component with a #4 femoral component with 18 mm x 75 mm fluted stem, and the above-mentioned augments posteriorly and distally.  These were impacted in place with the polymethyl methacrylate and added vancomycin.  This was impacted in place.  We used the trial 17.5 mm polyethylene bearing and then placed a knee extension.  Any extraneous methacrylate was removed.  During this time, I  removed the polyethylene component from the existing patella, thought the metallic component was perfectly stable and we inserted another polyethylene component, snapping it in place.  The wound was copiously irrigated with saline solution.  At approximately 16 minutes, the methacrylate had matured, during which time, we injected the joint with 0.25% Marcaine.  We then checked the joint, removed the trial polyethylene bearing, inserted the final 17.5 mm component.  We had excellent stability, full extension, flexed over in 100 degrees.  At that point, the tourniquet was deflated at approximately an hour.  Again, we had immediate capillary refill to the joint surface.  The wound was again irrigated with saline solution.  MCL and LCL remained intact.  I applied topical tranexamic acid, inserted a medium-sized Hemovac in the lateral compartment, and then closed the deep capsule with #1 Ethibond, superficial capsule with 0 Vicryl, and the subcu with 3-0 Monocryl. Skin was closed with skin clips.  Sterile bulky dressing was applied followed by the patient's support stocking.  The patient tolerated the procedure without complications.  Technically, it was difficult because of the bone loss although we had an excellent stable construct postoperatively.     Vonna Kotyk. Durward Fortes, M.D.     PWW/MEDQ  D:  03/18/2015  T:  03/18/2015  Job:  [id number]

## 2015-03-19 ENCOUNTER — Encounter (HOSPITAL_COMMUNITY): Payer: Self-pay | Admitting: Orthopaedic Surgery

## 2015-03-19 LAB — BASIC METABOLIC PANEL
ANION GAP: 6 (ref 5–15)
BUN: 15 mg/dL (ref 6–20)
CALCIUM: 8.6 mg/dL — AB (ref 8.9–10.3)
CO2: 27 mmol/L (ref 22–32)
Chloride: 101 mmol/L (ref 101–111)
Creatinine, Ser: 1.34 mg/dL — ABNORMAL HIGH (ref 0.61–1.24)
GFR, EST AFRICAN AMERICAN: 52 mL/min — AB (ref >60)
GFR, EST NON AFRICAN AMERICAN: 45 mL/min — AB (ref >60)
Glucose, Bld: 208 mg/dL — ABNORMAL HIGH (ref 65–99)
POTASSIUM: 4.2 mmol/L (ref 3.5–5.1)
Sodium: 134 mmol/L — ABNORMAL LOW (ref 135–145)

## 2015-03-19 LAB — CBC
HEMATOCRIT: 30.1 % — AB (ref 39.0–52.0)
Hemoglobin: 9.9 g/dL — ABNORMAL LOW (ref 13.0–17.0)
MCH: 31.4 pg (ref 26.0–34.0)
MCHC: 32.9 g/dL (ref 30.0–36.0)
MCV: 95.6 fL (ref 78.0–100.0)
PLATELETS: 152 10*3/uL (ref 150–400)
RBC: 3.15 MIL/uL — AB (ref 4.22–5.81)
RDW: 14.8 % (ref 11.5–15.5)
WBC: 13 10*3/uL — AB (ref 4.0–10.5)

## 2015-03-19 LAB — PROTIME-INR
INR: 2.01 — AB (ref 0.00–1.49)
Prothrombin Time: 22.6 seconds — ABNORMAL HIGH (ref 11.6–15.2)

## 2015-03-19 MED ORDER — WARFARIN SODIUM 5 MG PO TABS
5.0000 mg | ORAL_TABLET | Freq: Once | ORAL | Status: AC
  Administered 2015-03-19: 5 mg via ORAL
  Filled 2015-03-19: qty 1

## 2015-03-19 NOTE — Clinical Social Work Note (Signed)
Clinical Social Work Assessment  Patient Details  Name: James [name] MRN: [id number] Date of Birth: May 03, 1922  Date of referral:  03/19/15               Reason for consult:  Facility Placement, Discharge Planning                Permission sought to share information with:  Facility Medical sales representative, Family Supports Permission granted to share information::  Yes, Verbal Permission Granted  Name::     Rozann Lesches / Erick Alley  Agency::  [facility] (preference: [facility])  Relationship::  Son Youth worker) / Wife  Contact Information:     Housing/Transportation Living arrangements for the past 2 months:  Single Family Home Source of Information:  Adult Children Patient Interpreter Needed:  None Criminal Activity/Legal Involvement Pertinent to Current Situation/Hospitalization:  No - Comment as needed Significant Relationships:  Adult Children, Spouse Lives with:  Spouse Do you feel safe going back to the place where you live?  No (High fall risk.) Need for family participation in patient care:  Yes (Comment) (Patient's family active in patient's care.)  Care giving concerns:  Patient nor patient's family expressed concern at this time.   Social Worker assessment / plan:  CSW received referral for possible SNF placement at time of discharge. CSW spoke with patient's son regarding discharge disposition. Per patient's son, family agreeable to PT recommendation of SNF placement and are requesting [facility] for patient's disposition at time of discharge. CSW to continue to follow and assist with discharge planning needs.  Employment status:  Retired Health and safety inspector:  [employer]) PT Recommendations:  Skilled Nursing Facility Information / Referral to community resources:  Skilled Nursing Facility  Patient/Family's Response to care:  Patient's family understanding and agreeable to CSW plan of care.  Patient/Family's Understanding  of and Emotional Response to Diagnosis, Current Treatment, and Prognosis:  Patient's family understanding and agreeable to CSW plan of care.  Emotional Assessment Appearance:  Appears stated age Attitude/Demeanor/Rapport:  Other (CSW spoke with patient's son to assist with discharge planning.) Affect (typically observed):  Other (CSW spoke with patient's son to assist with discharge planning.) Orientation:  Oriented to Self, Oriented to Place, Oriented to  Time, Oriented to Situation Alcohol / Substance use:  Not Applicable Psych involvement (Current and /or in the community):  No (Comment) (Not appropriate on this admission.)  Discharge Needs  Concerns to be addressed:  No discharge needs identified Readmission within the last 30 days:  No Current discharge risk:  None Barriers to Discharge:  No Barriers Identified   Rod Mae, LCSW 03/19/2015, 11:19 AM [phone]

## 2015-03-19 NOTE — Progress Notes (Signed)
Patient ID: James [name], male   DOB: 20-Oct-1921, 81 y.o.   MRN: [id number] PATIENT ID: [name]        MRN:  [id number]          DOB/AGE: 1921/10/02 / 81 y.o.    James Campbell, MD   Jacqualine Code, PA-C [address] [place], [place]  [address]                             [phone]   PROGRESS NOTE  Subjective:  negative for Chest Pain  negative for Shortness of Breath  negative for Nausea/Vomiting   negative for Calf Pain    Tolerating Diet: yes         Patient reports pain as mild.     Good night, femoral nerve block still effect  Objective: Vital signs in last 24 hours:   Patient Vitals for the past 24 hrs:  BP Temp Temp src Pulse Resp SpO2  03/19/15 0603 (!) 152/82 mmHg 98.4 F (36.9 C) Oral 77 16 99 %  03/18/15 2052 132/71 mmHg 98.4 F (36.9 C) Oral 77 18 98 %  03/18/15 1444 (!) 174/89 mmHg 98.7 F (37.1 C) Oral 75 - 100 %  03/18/15 1345 (!) 158/80 mmHg - - - - -  03/18/15 1330 (!) 169/83 mmHg - - - - -  03/18/15 1315 (!) 153/75 mmHg - - 74 15 100 %  03/18/15 1300 (!) 172/89 mmHg - - 61 (!) 21 100 %  03/18/15 1245 (!) 162/95 mmHg - - 73 20 100 %  03/18/15 1230 (!) 163/83 mmHg - - 65 17 100 %  03/18/15 1218 122/73 mmHg 97.3 F (36.3 C) - 76 15 96 %      Intake/Output from previous day:   09/20 0701 - 09/21 0700 In: 1190 [P.O.:240; I.V.:800] Out: 1400 [Urine:1000]   Intake/Output this shift:   09/21 0701 - 09/21 1900 In: 1245 [I.V.:1095] Out: -    Intake/Output      09/20 0701 - 09/21 0700 09/21 0701 - 09/22 0700   P.O. 240    I.V. (mL/kg) 800 (8.8) 1095 (12)   IV Piggyback 150 150   Total Intake(mL/kg) 1190 (13.1) 1245 (13.7)   Urine (mL/kg/hr) 1000 (0.5)    Drains 0 (0)    Blood 400 (0.2)    Total Output 1400     Net -210 +1245           LABORATORY DATA:  Recent Labs  03/12/15 1350 03/18/15 1320 03/19/15 0455  WBC 7.2 13.2* 13.0*  HGB 13.0 11.7* 9.9*  HCT 39.7 35.5* 30.1*  PLT 187 148* 152    Recent Labs  03/12/15 1350  03/18/15 1320 03/19/15 0455  NA 136 139 134*  K 4.3 3.7 4.2  CL 101 104 101  CO2 BUN CREATININE 1.31* 1.22 1.34*  GLUCOSE 126* 165* 208*  CALCIUM 9.2 9.0 8.6*   Lab Results  Component Value Date   INR 2.01* 03/19/2015   INR 1.51* 03/18/2015   INR 2.34* 03/12/2015    Recent Radiographic Studies :  Dg Chest 2 View  03/12/2015   CLINICAL DATA:  Preop exam prior to surgery. History of kidney disease and hypertension. Patient for total knee revision.  EXAM: CHEST  2 VIEW  COMPARISON:  09/19/2006  FINDINGS: Cardiac silhouette is mildly enlarged. The aorta is uncoiled. No mediastinal or hilar masses  or evidence of adenopathy.  Posterior lateral left hemidiaphragm is elevated. There is left lower lung zone coarse reticular type opacities consistent with scarring similar to the prior study. Remainder of the lungs is clear. No pleural effusion or pneumothorax.  Bony thorax is demineralized but grossly intact.  IMPRESSION: No acute cardiopulmonary disease.   Electronically Signed   By: Amie Portland M.D.   On: 03/12/2015 14:38   Dg Knee Complete 4 Views Left  03/18/2015   CLINICAL DATA:  Surgery.  Left total knee revision.  EXAM: DG C-ARM 61-120 MIN; LEFT KNEE - COMPLETE 4+ VIEW  COMPARISON:  None.  FINDINGS: Intraoperative spot fluoroscopic views of the left knee show changes of total knee arthroplasty with long-stem hardware in the femur and proximal tibia. Although bony detail is limited, no periprosthetic fracture is identified.  IMPRESSION: Intraoperative radiographs obtained during left knee arthroplasty revision.   Electronically Signed   By: Britta Mccreedy M.D.   On: 03/18/2015 11:29   Dg C-arm 1-60 Min  03/18/2015   CLINICAL DATA:  Surgery.  Left total knee revision.  EXAM: DG C-ARM 61-120 MIN; LEFT KNEE - COMPLETE 4+ VIEW  COMPARISON:  None.  FINDINGS: Intraoperative spot fluoroscopic views of the left knee show changes of total knee arthroplasty with long-stem hardware in  the femur and proximal tibia. Although bony detail is limited, no periprosthetic fracture is identified.  IMPRESSION: Intraoperative radiographs obtained during left knee arthroplasty revision.   Electronically Signed   By: Britta Mccreedy M.D.   On: 03/18/2015 11:29     Examination:  General appearance: alert, cooperative and no distress  Wound Exam: clean, dry, intact   Drainage:  None: wound tissue dry  Motor Exam: EHL, FHL, Anterior Tibial and Posterior Tibial Intact  Sensory Exam: Superficial Peroneal, Deep Peroneal and Tibial normal  Vascular Exam: no change from pre op-good cap refill  Assessment:    1 Day Post-Op  Procedure(s) (LRB): TOTAL KNEE REVISION (Left)  ADDITIONAL DIAGNOSIS:  Principal Problem:   Failed total left knee replacement Active Problems:   S/P total knee replacement using cement  Acute Blood Loss Anemia-asymptomatic   Plan: Physical Therapy as ordered Weight Bearing as Tolerated (WBAT)  DVT Prophylaxis:  Lovenox, Coumadin, Foot Pumps and TED hose  DISCHARGE PLAN: Skilled Nursing Facility/Rehab-[facility] in [place]  DISCHARGE NEEDS: CPM, Walker and 3-in-1 comode seat   looks great this am without SOB or chest pain, no drainage in hemovac-will D/C, foley out-OOB with PT, monitor lab     James [name] W  03/19/2015 8:01 AM

## 2015-03-19 NOTE — Progress Notes (Signed)
Orthopedic Tech Progress Note Patient Details:  James [name] 1922-04-10 [id number] CPM applied to LLE with appropriate settings.  CPM Left Knee CPM Left Knee: On Left Knee Flexion (Degrees): 60 Left Knee Extension (Degrees): 0   Asia R Thompson 03/19/2015, 6:43 AM

## 2015-03-19 NOTE — Clinical Social Work Placement (Signed)
   CLINICAL SOCIAL WORK PLACEMENT  NOTE  Date:  03/19/2015  Patient Details  Name: James [name] MRN: [id number] Date of Birth: 1921/12/08  Clinical Social Work is seeking post-discharge placement for this patient at the Skilled  Nursing Facility level of care (*CSW will initial, date and re-position this form in  chart as items are completed):  Yes   Patient/family provided with [facility] Clinical Social Work Department's list of facilities offering this level of care within the geographic area requested by the patient (or if unable, by the patient's family).  Yes   Patient/family informed of their freedom to choose among providers that offer the needed level of care, that participate in Medicare, Medicaid or managed care program needed by the patient, have an available bed and are willing to accept the patient.  Yes   Patient/family informed of [facility]'s ownership interest in [facility] and [facility], as well as of the fact that they are under no obligation to receive care at these facilities.  PASRR submitted to EDS on 03/19/15     PASRR number received on 03/19/15     Existing PASRR number confirmed on  (n/a)     FL2 transmitted to all facilities in geographic area requested by pt/family on 03/19/15     FL2 transmitted to all facilities within larger geographic area on  (n/a)     Patient informed that his/her managed care company has contracts with or will negotiate with certain facilities, including the following:   (yes, [facility])         Patient/family informed of bed offers received.  Patient chooses bed at       Physician recommends and patient chooses bed at      Patient to be transferred to   on  .  Patient to be transferred to facility by       Patient family notified on   of transfer.  Name of family member notified:        PHYSICIAN Please sign FL2     Additional Comment:     _______________________________________________ Rod Mae, LCSW 03/19/2015, 11:22 AM

## 2015-03-19 NOTE — Progress Notes (Signed)
ANTICOAGULATION CONSULT NOTE - Follow Up Consult  Pharmacy Consult for Coumadin Indication: PE history / CVA history / s/p left total knee revision  Allergies  Allergen Reactions  . No Known Allergies     Patient Measurements: Height:  (170.2 cm) Weight: 201 lb (91.173 kg) IBW/kg (Calculated) : 66.1  Vital Signs: Temp: 98.7 F (37.1 C) (09/21 1305) Temp Source: Oral (09/21 1305) BP: 171/74 mmHg (09/21 1025) Pulse Rate: 70 (09/21 1305)  Labs:  Recent Labs  03/18/15 0632 03/18/15 1320 03/19/15 0455  HGB  --  11.7* 9.9*  HCT  --  35.5* 30.1*  PLT  --  148* 152  LABPROT 18.3*  --  22.6*  INR 1.51*  --  2.01*  CREATININE  --  1.22 1.34*    Estimated Creatinine Clearance: 38.6 mL/min (by C-G formula based on Cr of 1.34).   Medications:  Prescriptions prior to admission  Medication Sig Dispense Refill Last Dose  . allopurinol (ZYLOPRIM) 300 MG tablet Take 300 mg by mouth daily.     03/17/2015 at Unknown time  . colchicine 0.6 MG tablet Take 0.6 mg by mouth daily.   03/17/2015 at Unknown time  . doxazosin (CARDURA) 8 MG tablet Take 8 mg by mouth at bedtime.     03/17/2015 at Unknown time  . furosemide (LASIX) 40 MG tablet Take 0.5 tablets (20 mg total) by mouth daily. 90 tablet 3 03/17/2015 at Unknown time  . HYDROcodone-acetaminophen (NORCO/VICODIN) 5-325 MG per tablet Take 1 tablet by mouth every 6 (six) hours as needed for moderate pain.   03/17/2015 at Unknown time  . lisinopril (PRINIVIL,ZESTRIL) 40 MG tablet TAKE ONE TABLET BY MOUTH DAILY. 30 tablet 11 Taking  . metoprolol (LOPRESSOR) 50 MG tablet TAKE (1) TABLET BY MOUTH TWICE DAILY. 60 tablet 3 03/18/2015 at 0400  . Multiple Vitamin (MULTIVITAMIN) tablet Take 1 tablet by mouth daily.     03/12/15  . Multiple Vitamins-Minerals (PRESERVISION AREDS PO) Take by mouth.   03/12/15  . omeprazole (PRILOSEC) 20 MG capsule Take 20 mg by mouth daily.     03/17/2015 at Unknown time  . polyethylene glycol (MIRALAX / GLYCOLAX)  packet Take 17 g by mouth daily.     03/17/2015 at Unknown time  . potassium chloride (K-DUR) 10 MEQ tablet TAKE ONE TABLET BY MOUTH DAILY. 30 tablet 11 03/17/2015 at Unknown time  . simvastatin (ZOCOR) 40 MG tablet TAKE (1) TABLET BY MOUTH AT BEDTIME FOR CHOLESTEROL. 30 tablet 9 03/17/2015 at Unknown time  . warfarin (COUMADIN) 5 MG tablet Take 5 mg by mouth daily.   03/12/15  . warfarin (COUMADIN) 5 MG tablet Take 1 tablet (5 mg total) by mouth as directed. (Patient taking differently: Take 5 mg by mouth daily. ) 30 tablet 11 03/12/15    Assessment: 81 yo M on Coumadin PTA now s/p TKA.  INR today = 2.  Goal of Therapy:  INR 2-3 Monitor platelets by anticoagulation protocol: Yes   Plan:  Discontinue Lovenox (INR =2) Coumadin  PO x 1 tonight (home dose) Continue daily INR  [employer], Pharm.D., BCPS Clinical Pharmacist Pager [phone] 03/19/2015 1:36 PM

## 2015-03-19 NOTE — Progress Notes (Signed)
Physical Therapy Treatment Patient Details Name: James [name] MRN: [id number] DOB: Oct 29, 1921 Today's Date: 03/19/2015    History of Present Illness Pt is a 81 y/o M s/p Lt total knee revision 2/2 loosening.  Pt's PMH includes mini-stroke several years ago, a fib, anemia, HTN, dysrhythmia, DVT, PE.    PT Comments    Mr. Weidner requires mod +2 assist for bed mobility w/ trunk support posteriorly and use of bed pad.  He demonstrated a strong Lt lateral lean in standing 2/2 lack of Lt knee extension and required +3 to assist pt to recliner chair.  Pt would benefit from Lt KI order to ensure pt's safety w/ sit<>stand transfers.  Pt will benefit from continued skilled PT services to increase functional independence and safety.   Follow Up Recommendations  SNF     Equipment Recommendations  Other (comment) (TBD at next venue of care)    Recommendations for Other Services       Precautions / Restrictions Precautions Precautions: Knee;Fall Precaution Comments: Reviewed knee precautions Restrictions Weight Bearing Restrictions: Yes LLE Weight Bearing: Weight bearing as tolerated    Mobility  Bed Mobility Overal bed mobility: Needs Assistance;+2 for physical assistance Bed Mobility: Supine to Sit     Supine to sit: HOB elevated;Mod assist;+2 for physical assistance     General bed mobility comments: Mod assist supporting trunk posteriorly and use of bed pad to scoot pt's pelvis to sitting EOB.  Cues for hand placement as pt relies heavily on bed rails.  Requires hand over hand for proper technique to sit upright as he demonstrates posterior lean.    Transfers Overall transfer level: Needs assistance Equipment used: Rolling walker (2 wheeled) Transfers: Sit to/from Stand Sit to Stand: Max assist;+2 physical assistance         General transfer comment: Cues for technique to scoot to EOB and lean forward.  Max assist to power up to standing.  Pt w/ heavy lean to Lt side as he  does not demonstrate ability to extend Lt knee.  +2 assist to maintain standing while nurse tech moves bed out of the way and positions chair behind pt to sit.    Ambulation/Gait                 Stairs            Wheelchair Mobility    Modified Rankin (Stroke Patients Only)       Balance Overall balance assessment: Needs assistance Sitting-balance support: Bilateral upper extremity supported;Feet supported Sitting balance-Leahy Scale: Fair Sitting balance - Comments: Min guard 2/2 pt's instability and posterior lean.  Pt able to sit unassisted for ~5 minutes Postural control: Posterior lean;Left lateral lean Standing balance support: Bilateral upper extremity supported;During functional activity Standing balance-Leahy Scale: Zero                      Cognition Arousal/Alertness: Awake/alert Behavior During Therapy: WFL for tasks assessed/performed Overall Cognitive Status: Within Functional Limits for tasks assessed (Although difficulty understanding his challenge w/ ambulatio)       Memory: Decreased recall of precautions              Exercises Total Joint Exercises Ankle Circles/Pumps: AROM;Both;10 reps;Supine Quad Sets: Strengthening;Both;Supine;5 reps Heel Slides: AROM;Left;5 reps;Seated Hip ABduction/ADduction: AROM;AAROM;Left;10 reps;Seated Straight Leg Raises: AAROM;Left;Supine;10 reps Goniometric ROM: 21-68    General Comments General comments (skin integrity, edema, etc.): Provided pt w/ encouragement as he does not understand why he cannot function  how he did PTA.  Contacted RN requesting an order placed for Lt KI as this will likely assist pt w/ achieving upright in standing.  Education provided to pt about importance of maintaining Lt knee extension when supine or sitting, towel roll placed under Lt ankle at end of session.      Pertinent Vitals/Pain Pain Assessment: Faces Pain Score: 0-No pain Faces Pain Scale: Hurts a little  bit Pain Location: Lt knee Pain Descriptors / Indicators: Grimacing Pain Intervention(s): Limited activity within patient's tolerance;Monitored during session;Repositioned;Premedicated before session    Home Living                      Prior Function            PT Goals (current goals can now be found in the care plan section) Acute Rehab PT Goals Patient Stated Goal: to be able to stand up straight and walk PT Goal Formulation: With patient/family Time For Goal Achievement: 04/01/15 Potential to Achieve Goals: Good Progress towards PT goals: Progressing toward goals (modestly)    Frequency  7X/week    PT Plan Current plan remains appropriate    Co-evaluation PT/OT/SLP Co-Evaluation/Treatment: Yes Reason for Co-Treatment: For patient/therapist safety PT goals addressed during session: Mobility/safety with mobility;Balance;Proper use of DME;Strengthening/ROM       End of Session Equipment Utilized During Treatment: Gait belt Activity Tolerance: Patient limited by fatigue Patient left: with call bell/phone within reach;with family/visitor present;in chair     Time: 1610-9604 PT Time Calculation (min) (ACUTE ONLY): 34 min  Charges:  $Therapeutic Activity: 8-22 mins                    G Codes:      Michail Jewels PT, DPT [phone] Pager: [phone] 03/19/2015, 11:40 AM

## 2015-03-19 NOTE — Evaluation (Signed)
Occupational Therapy Evaluation Patient Details Name: James [name] MRN: [id number] DOB: [birth year] Today's Date: 03/19/2015    History of Present Illness Pt is a 81 y/o M s/p Lt total knee revision 2/2 loosening.  Pt's PMH includes mini-stroke several years ago, a fib, anemia, HTN, dysrhythmia, DVT, PE.   Clinical Impression   Pt reports he required min assist from him wife for ADLs PTA. Currently is max assist for ADLs and max assist +2 for functional transfers. Recommending SNF for continued rehab required to safely d/c home with intermittent caregiver assistance. Pt would benefit from continued skilled acute OT in order to increase safety and independence with functional transfers.     Follow Up Recommendations  SNF;Supervision/Assistance - 24 hour    Equipment Recommendations  Other (comment) (TBD at next venue of care)    Recommendations for Other Services       Precautions / Restrictions Precautions Precautions: Knee;Fall Precaution Comments: Reviewed knee precautions Restrictions Weight Bearing Restrictions: Yes LLE Weight Bearing: Weight bearing as tolerated      Mobility Bed Mobility Overal bed mobility: Needs Assistance;+2 for physical assistance Bed Mobility: Supine to Sit     Supine to sit: HOB elevated;Mod assist;+2 for physical assistance     General bed mobility comments: Mod assist supporting trunk posteriorly and use of bed pad to scoot pt's pelvis to sitting EOB.  Cues for hand placement as pt relies heavily on bed rails.  Requires hand over hand for proper technique to sit upright as he demonstrates posterior lean.    Transfers Overall transfer level: Needs assistance Equipment used: Rolling walker (2 wheeled) Transfers: Sit to/from Stand Sit to Stand: Max assist;+2 physical assistance         General transfer comment: Cues for technique to scoot to EOB and lean forward.  Max assist to power up to standing.  Pt w/ heavy lean to Lt side as he  does not demonstrate ability to extend Lt knee.  +2 assist to maintain standing while nurse tech moves bed out of the way and positions chair behind pt to sit.      Balance Overall balance assessment: Needs assistance Sitting-balance support: Bilateral upper extremity supported;Feet supported Sitting balance-Leahy Scale: Fair Sitting balance - Comments: Min guard 2/2 pt's instability and posterior lean.  Pt able to sit unassisted for ~5 minutes Postural control: Posterior lean;Left lateral lean Standing balance support: Bilateral upper extremity supported Standing balance-Leahy Scale: Zero                              ADL Overall ADL's : Needs assistance/impaired                                       General ADL Comments: Pt reports he needed assistance with bathing, dressing, and getting in and out of the shower PTA. Currently pt is max A for LB ADLs and max +2 for functional transfers     Vision     Perception     Praxis      Pertinent Vitals/Pain Pain Assessment: Faces Pain Score: 0-No pain Faces Pain Scale: Hurts a little bit Pain Location: L knee Pain Descriptors / Indicators: Grimacing Pain Intervention(s): Limited activity within patient's tolerance;Monitored during session;Repositioned     Hand Dominance     Extremity/Trunk Assessment Upper Extremity Assessment Upper Extremity Assessment: Generalized weakness  Lower Extremity Assessment Lower Extremity Assessment: Defer to PT evaluation       Communication Communication Communication: HOH   Cognition Arousal/Alertness: Awake/alert Behavior During Therapy: WFL for tasks assessed/performed Overall Cognitive Status: Within Functional Limits for tasks assessed       Memory: Decreased recall of precautions             General Comments       Exercises Exercises: Total Joint     Shoulder Instructions      Home Living Family/patient expects to be discharged to::  Skilled nursing facility                                 Additional Comments: Has WC he used to get to the car and back w/ wife pushing.  Has RW and rollator      Prior Functioning/Environment Level of Independence: Needs assistance    ADL's / Homemaking Assistance Needed: Wife assist pt w/ bathing, dressing, shower transfers        OT Diagnosis: Generalized weakness;Acute pain   OT Problem List: Decreased strength;Decreased activity tolerance;Impaired balance (sitting and/or standing);Decreased safety awareness;Decreased knowledge of use of DME or AE;Decreased knowledge of precautions;Pain   OT Treatment/Interventions: Self-care/ADL training;Energy conservation;DME and/or AE instruction;Patient/family education    OT Goals(Current goals can be found in the care plan section) Acute Rehab OT Goals Patient Stated Goal: to be able to stand up straight and walk OT Goal Formulation: With patient Time For Goal Achievement: 04/02/15 Potential to Achieve Goals: Fair ADL Goals Pt Will Perform Grooming: with set-up;sitting Pt Will Perform Lower Body Bathing: with min assist;sit to/from stand;with caregiver independent in assisting Pt Will Perform Lower Body Dressing: with min assist;sit to/from stand;with caregiver independent in assisting Pt Will Transfer to Toilet: with min assist;ambulating;bedside commode Pt Will Perform Tub/Shower Transfer: with min assist;ambulating;shower seat;rolling walker  OT Frequency: Min 2X/week   Barriers to D/C: Decreased caregiver support          Co-evaluation PT/OT/SLP Co-Evaluation/Treatment: Yes Reason for Co-Treatment: For patient/therapist safety PT goals addressed during session: Mobility/safety with mobility;Balance;Proper use of DME;Strengthening/ROM OT goals addressed during session: ADL's and self-care      End of Session Equipment Utilized During Treatment: Gait belt;Rolling walker CPM Left Knee CPM Left Knee: Off Nurse  Communication: Mobility status;Precautions;Weight bearing status  Activity Tolerance: Patient tolerated treatment well Patient left: in chair;with call bell/phone within reach;with family/visitor present   Time: 1610-9604 OT Time Calculation (min): 28 min Charges:  OT General Charges $OT Visit: 1 Procedure OT Evaluation $Initial OT Evaluation Tier I: 1 Procedure G-Codes:      Gaye Alken M.S., OTR/L Pager: [phone]  03/19/2015, 1:50 PM

## 2015-03-19 NOTE — Progress Notes (Signed)
Orthopedic Tech Progress Note Patient Details:  CRISTIN SZATKOWSKI 1922-06-04 [id number]  Ortho Devices Type of Ortho Device: Knee Immobilizer Ortho Device/Splint Location: lle Ortho Device/Splint Interventions: Application   Crawford, Rembert 03/19/2015, 12:44 PM

## 2015-03-20 ENCOUNTER — Inpatient Hospital Stay
Admission: RE | Admit: 2015-03-20 | Discharge: 2015-03-31 | Disposition: A | Discharge status: Other Acute Inpatient Hospital | Payer: Medicare Other | Source: Ambulatory Visit | Attending: Pulmonary Disease | Admitting: Pulmonary Disease

## 2015-03-20 LAB — CBC
HEMATOCRIT: 25 % — AB (ref 39.0–52.0)
HEMATOCRIT: 27.6 % — AB (ref 39.0–52.0)
HEMOGLOBIN: 8.3 g/dL — AB (ref 13.0–17.0)
Hemoglobin: 8.9 g/dL — ABNORMAL LOW (ref 13.0–17.0)
MCH: 31.3 pg (ref 26.0–34.0)
MCH: 30.9 pg (ref 26.0–34.0)
MCHC: 33.2 g/dL (ref 30.0–36.0)
MCHC: 32.2 g/dL (ref 30.0–36.0)
MCV: 94.3 fL (ref 78.0–100.0)
MCV: 95.8 fL (ref 78.0–100.0)
PLATELETS: 128 10*3/uL — AB (ref 150–400)
Platelets: 131 10*3/uL — ABNORMAL LOW (ref 150–400)
RBC: 2.65 MIL/uL — AB (ref 4.22–5.81)
RBC: 2.88 MIL/uL — AB (ref 4.22–5.81)
RDW: 14.8 % (ref 11.5–15.5)
RDW: 14.7 % (ref 11.5–15.5)
WBC: 10.6 10*3/uL — AB (ref 4.0–10.5)
WBC: 11.2 10*3/uL — AB (ref 4.0–10.5)

## 2015-03-20 LAB — BASIC METABOLIC PANEL
ANION GAP: 5 (ref 5–15)
BUN: 19 mg/dL (ref 6–20)
CHLORIDE: 103 mmol/L (ref 101–111)
CO2: 28 mmol/L (ref 22–32)
Calcium: 8.5 mg/dL — ABNORMAL LOW (ref 8.9–10.3)
Creatinine, Ser: 1.5 mg/dL — ABNORMAL HIGH (ref 0.61–1.24)
GFR calc Af Amer: 45 mL/min — ABNORMAL LOW (ref >60)
GFR, EST NON AFRICAN AMERICAN: 39 mL/min — AB (ref >60)
GLUCOSE: 154 mg/dL — AB (ref 65–99)
POTASSIUM: 4 mmol/L (ref 3.5–5.1)
Sodium: 136 mmol/L (ref 135–145)

## 2015-03-20 LAB — PROTIME-INR
INR: 3.3 — ABNORMAL HIGH (ref 0.00–1.49)
Prothrombin Time: 32.9 seconds — ABNORMAL HIGH (ref 11.6–15.2)

## 2015-03-20 MED ORDER — OXYCODONE HCL 5 MG PO TABS: 5.0000 mg | ORAL_TABLET | ORAL | Status: DC | PRN

## 2015-03-20 MED ORDER — WARFARIN SODIUM 5 MG PO TABS: 5.0000 mg | ORAL_TABLET | Freq: Every day | ORAL | Status: DC

## 2015-03-20 NOTE — Discharge Planning (Signed)
Patient to be discharged to [facility]. Patient's wife and daughter updated at bedside.  [employer] authorization received on 03/20/2015.  Facility: [facility] RN report number: [phone] Transportation: EMS  Marcelline Deist, [place] [phone] Clinical Social Work Department Orthopedics [phone]) and Surgical [phone])

## 2015-03-20 NOTE — Progress Notes (Signed)
Physical Therapy Treatment Patient Details Name: James [name] MRN: [id number] DOB: 05-15-1922 Today's Date: 03/20/2015    History of Present Illness Pt is a 81 y/o M s/p Lt total knee revision 2/2 loosening.  Pt's PMH includes mini-stroke several years ago, a fib, anemia, HTN, dysrhythmia, DVT, PE.    PT Comments    Pt's strong posterior lean in sitting and standing were his main limitations for mobility today. James [name] required mod +2 assist for bed mobility and max +2 assist for sit<>stand and stand pivot transfers.  Pt will benefit from continued skilled PT services to increase functional independence and safety.   Follow Up Recommendations  SNF     Equipment Recommendations  Other (comment) (TBD at next venue of care)    Recommendations for Other Services       Precautions / Restrictions Precautions Precautions: Knee;Fall Precaution Comments: Reviewed knee precautions Required Braces or Orthoses: Knee Immobilizer - Left Knee Immobilizer - Left: Other (comment) (for increased stability in standing) Restrictions Weight Bearing Restrictions: Yes LLE Weight Bearing: Weight bearing as tolerated    Mobility  Bed Mobility Overal bed mobility: Needs Assistance;+2 for physical assistance Bed Mobility: Supine to Sit     Supine to sit: HOB elevated;Mod assist;+2 for physical assistance     General bed mobility comments: Mod assist supporting trunk posteriorly and use of bed pad to scoot pt's pelvis to sitting EOB.  Cues for hand placement as pt relies heavily on bed rails.  Requires hand over hand for proper technique to sit upright as he demonstrates posterior lean.    Transfers Overall transfer level: Needs assistance Equipment used: Rolling walker (2 wheeled) Transfers: Sit to/from [employer] Sit to Stand: Max assist;+2 physical assistance Stand pivot transfers: Max assist;+2 physical assistance       General transfer comment: Max +2 assist to power  up to standing and to maintain upright as pt demonstrates strong posterior lean.  He requires max verbal cues and demonstration w/ mild improvement w/ posterior lean prior to and during standing.  Upon standing verbal and tactile cues to position Lt LE under body so he can weight bear through it.  Pt shuffles during stand pivot.  Ambulation/Gait                 Stairs            Wheelchair Mobility    Modified Rankin (Stroke Patients Only)       Balance Overall balance assessment: Needs assistance Sitting-balance support: Bilateral upper extremity supported;Feet supported Sitting balance-Leahy Scale: Poor Sitting balance - Comments: Support to avoid leaning too far posteriorly. Pt able to sit EOB w/o assist for ~3 minutes Postural control: Posterior lean Standing balance support: Bilateral upper extremity supported;During functional activity Standing balance-Leahy Scale: Zero                      Cognition Arousal/Alertness: Awake/alert Behavior During Therapy: Anxious (anxious about falling) Overall Cognitive Status: Within Functional Limits for tasks assessed       Memory: Decreased recall of precautions              Exercises Total Joint Exercises Ankle Circles/Pumps: AROM;Both;10 reps;Supine Quad Sets: Strengthening;Both;Supine;10 reps Straight Leg Raises: AAROM;Left;Supine;10 reps;AROM    General Comments General comments (skin integrity, edema, etc.): Pt's strong posterior lean in sitting and standing were his main limitations for mobility today.       Pertinent Vitals/Pain Pain Assessment: Faces Faces Pain  Scale: Hurts little more Pain Location: Lt knee Pain Descriptors / Indicators: Grimacing Pain Intervention(s): Limited activity within patient's tolerance;Monitored during session;Repositioned    Home Living                      Prior Function            PT Goals (current goals can now be found in the care plan  section) Acute Rehab PT Goals Patient Stated Goal: to be able to walk and have a BM PT Goal Formulation: With patient/family Time For Goal Achievement: 04/01/15 Potential to Achieve Goals: Good Progress towards PT goals: Progressing toward goals (very modestly)    Frequency  7X/week    PT Plan Current plan remains appropriate    Co-evaluation PT/OT/SLP Co-Evaluation/Treatment: Yes           End of Session Equipment Utilized During Treatment: Gait belt Activity Tolerance: Patient limited by fatigue Patient left: with call bell/phone within reach;with family/visitor present;in chair     Time: 1914-7829 PT Time Calculation (min) (ACUTE ONLY): 34 min  Charges:  $Therapeutic Exercise: 8-22 mins $Therapeutic Activity: 8-22 mins                    G Codes:      James [name] PT, DPT [phone] Pager: [phone] 03/20/2015, 10:40 AM

## 2015-03-20 NOTE — Clinical Social Work Placement (Signed)
   CLINICAL SOCIAL WORK PLACEMENT  NOTE  Date:  03/20/2015  Patient Details  Name: James [name] MRN: [id number] Date of Birth: Apr 08, 1922  Clinical Social Work is seeking post-discharge placement for this patient at the Skilled  Nursing Facility level of care (*CSW will initial, date and re-position this form in  chart as items are completed):  Yes   Patient/family provided with [facility] Department's list of facilities offering this level of care within the geographic area requested by the patient (or if unable, by the patient's family).  Yes   Patient/family informed of their freedom to choose among providers that offer the needed level of care, that participate in Medicare, Medicaid or managed care program needed by the patient, have an available bed and are willing to accept the patient.  Yes   Patient/family informed of [facility] ownership interest in [facility] and [facility], as well as of the fact that they are under no obligation to receive care at these facilities.  PASRR submitted to EDS on 03/19/15     PASRR number received on 03/19/15     Existing PASRR number confirmed on  (n/a)     FL2 transmitted to all facilities in geographic area requested by pt/family on 03/19/15     FL2 transmitted to all facilities within larger geographic area on  (n/a)     Patient informed that his/her managed care company has contracts with or will negotiate with certain facilities, including the following:   (yes, [facility])     Yes   Patient/family informed of bed offers received.  Patient chooses bed at [facility]     Physician recommends and patient chooses bed at  (n/a)    Patient to be transferred to [facility] on 03/20/15.  Patient to be transferred to facility by [facility]     Patient family notified on 03/20/15 of transfer.  Name of family member notified:  wife and daughter at bedside.     PHYSICIAN        Additional Comment:    _______________________________________________ Rod Mae, LCSW 03/20/2015, 12:35 PM

## 2015-03-20 NOTE — Discharge Summary (Signed)
[name], MD   Jacqualine Code, PA-C [address], [place], [place]  [address]                             [phone]  PATIENT ID: James [name]        MRN:  [id number]          DOB/AGE: Oct 27, 1921 / 81 y.o.    DISCHARGE SUMMARY  ADMISSION DATE:    03/18/2015 DISCHARGE DATE:   03/20/2015   ADMISSION DIAGNOSIS: LOOSENED LEFT TOTAL KNEE ARTHROPLASTY    DISCHARGE DIAGNOSIS:  LOOSENED LEFT TOTAL KNEE ARTHROPLASTY    ADDITIONAL DIAGNOSIS: Principal Problem:   Failed total left knee replacement Active Problems:   S/P total knee replacement using cement  Past Medical History  Diagnosis Date  . Atrial fibrillation, chronic     Permanent atrial fibrillation  . Anemia     H&H of 12.4/38.5 with MCV of 110 in /2012  . Hypertension     Lab 05/2011: Normal CMet except glucose of 121 and creatinine of 1.37  . TOTAL KNEE REVISIONLeft   . Hyperlipidemia     Lipid profile in 05/2011:138, 120, 34, 80  . Cerebrovascular disease     Left posterior parietal infarction  . Chronic anticoagulation     H&H of 12.4/38.5 in 05/2011; negative stool Hemoccults in 05/2009  . Fasting hyperglycemia     Glucose values of 70-135 in 2010-2012  . Herpes zoster 2011  . Dysrhythmia   . Stroke   . Pulmonary embolism   . Deep vein thrombosis (DVT)   . History of kidney stones   . GERD (gastroesophageal reflux disease)   . Arthritis     PROCEDURE: Procedure(s): TOTAL KNEE REVISION Left on 03/18/2015  CONSULTS: none     HISTORY: James [name] is a very pleasant 81 year old white male who is seen today for evaluation of his left knee. He has a left total knee replacement performed in 2009 and had been doing fairly well until about a year ago when he started having pain in the left total knee. X-rays at that time appeared to show a loosening of the tibial component with some subsidence, particularly medially. The prosthesis was in a valgus position, but the tibia was in varus and seemed to be  exacerbated with weightbearing. He had done fairly well with a [employer] brace, but then also used a rolling walker. He does have a history of mini-stroke several years ago. He still has some residual weakness. Apparently has also had a history of pulmonary embolus and was in the ICU for 6 weeks. He has been seen and evaluated most recently in August of this year and has had physical therapy and continued to do his exercises at home. He is now having worsening pain and is to the point now where he is having to use a wheelchair for ambulatory purposes. His pain is worsening. He has pain now with every step in regard to his left knee. Radiographically, apparently he has had changes to the total knee in regard to loosening and subsidence  HOSPITAL COURSE:  James [name] is a 81 y.o. admitted on 03/18/2015 and found to have a diagnosis of LOOSENED LEFT TOTAL KNEE ARTHROPLASTY.  After appropriate laboratory studies were obtained  they were taken to the operating room on 03/18/2015 and underwent  Procedure(s): TOTAL KNEE REVISION  Left.   They were given perioperative antibiotics:  Anti-infectives  Start     Dose/Rate Route Frequency Ordered Stop   03/18/15 1500  ceFAZolin (ANCEF) IVPB 2 g/50 mL premix    Comments:  WILL CONTINUE FOR 24 HOUR ADDITIONAL SINCE IT IS A REVISION tkr   2 g 100 mL/hr over 30 Minutes Intravenous Every 6 hours 03/18/15 1405 03/19/15 2206   03/18/15 0915  vancomycin (VANCOCIN) powder  Status:  Discontinued       As needed 03/18/15 0915 03/18/15 1213   03/18/15 0700  ceFAZolin (ANCEF) IVPB 2 g/50 mL premix     2 g 100 mL/hr over 30 Minutes Intravenous To [facility] 03/17/15 1245 03/18/15 0750    .  Tolerated the procedure well.  Placed with a foley intraoperatively.    Toradol was given post op.  POD #1, allowed out of bed to a chair.  PT for ambulation and exercise program.  Foley D/C'd in morning.  IV saline locked.  O2 discontionued.  Hemovac pulled.  POD  #2, continued PT and ambulation.   The remainder of the hospital course was dedicated to ambulation and strengthening.   The patient was discharged on 2 Days Post-Op in  Stable condition.  Blood products given:none  DIAGNOSTIC STUDIES: Recent vital signs: Patient Vitals for the past 24 hrs:  BP Temp Temp src Pulse Resp SpO2  03/20/15 1049 (!) 127/50 mmHg - - 65 - -  03/20/15 0531 (!) 155/82 mmHg 98.3 F (36.8 C) Oral 66 17 97 %  03/19/15 2107 136/69 mmHg 98.5 F (36.9 C) Oral 74 17 97 %  03/19/15 1808 (!) 143/75 mmHg - - - - 97 %  03/19/15 1305 - 98.7 F (37.1 C) Oral 70 - 100 %       Recent laboratory studies:  Recent Labs  03/18/15 1320 03/19/15 0455 03/20/15 0412 03/20/15 1111  WBC 13.2* 13.0* 10.6* 11.2*  HGB 11.7* 9.9* 8.3* 8.9*  HCT 35.5* 30.1* 25.0* 27.6*  PLT 148* 152 131* 128*    Recent Labs  03/18/15 1320 03/19/15 0455 03/20/15 0412  NA 139 134* 136  K 3.7 4.2 4.0  CL 104 101 103  CO2 27 27 28   BUN 13 15 19   CREATININE 1.22 1.34* 1.50*  GLUCOSE 165* 208* 154*  CALCIUM 9.0 8.6* 8.5*   Lab Results  Component Value Date   INR 3.30* 03/20/2015   INR 2.01* 03/19/2015   INR 1.51* 03/18/2015     Recent Radiographic Studies :  Dg Chest 2 View  03/12/2015   CLINICAL DATA:  Preop exam prior to surgery. History of kidney disease and hypertension. Patient for total knee revision.  EXAM: CHEST  2 VIEW  COMPARISON:  09/19/2006  FINDINGS: Cardiac silhouette is mildly enlarged. The aorta is uncoiled. No mediastinal or hilar masses or evidence of adenopathy.  Posterior lateral left hemidiaphragm is elevated. There is left lower lung zone coarse reticular type opacities consistent with scarring similar to the prior study. Remainder of the lungs is clear. No pleural effusion or pneumothorax.  Bony thorax is demineralized but grossly intact.  IMPRESSION: No acute cardiopulmonary disease.   Electronically Signed   By: Amie Portland M.D.   On: 03/12/2015 14:38   Dg Knee  Complete 4 Views Left  03/18/2015   CLINICAL DATA:  Surgery.  Left total knee revision.  EXAM: DG C-ARM 61-120 MIN; LEFT KNEE - COMPLETE 4+ VIEW  COMPARISON:  None.  FINDINGS: Intraoperative spot fluoroscopic views of the left knee show changes of total knee arthroplasty with long-stem  hardware in the femur and proximal tibia. Although bony detail is limited, no periprosthetic fracture is identified.  IMPRESSION: Intraoperative radiographs obtained during left knee arthroplasty revision.   Electronically Signed   By: Britta Mccreedy M.D.   On: 03/18/2015 11:29   Dg C-arm 1-60 Min  03/18/2015   CLINICAL DATA:  Surgery.  Left total knee revision.  EXAM: DG C-ARM 61-120 MIN; LEFT KNEE - COMPLETE 4+ VIEW  COMPARISON:  None.  FINDINGS: Intraoperative spot fluoroscopic views of the left knee show changes of total knee arthroplasty with long-stem hardware in the femur and proximal tibia. Although bony detail is limited, no periprosthetic fracture is identified.  IMPRESSION: Intraoperative radiographs obtained during left knee arthroplasty revision.   Electronically Signed   By: Britta Mccreedy M.D.   On: 03/18/2015 11:29    DISCHARGE INSTRUCTIONS: Discharge Instructions    CPM    Complete by:  As directed   Continuous passive motion machine (CPM):      Use the CPM from 0 to 60 degrees for 6-8 hours per day.      You may increase by 5-10 per day.  You may break it up into 2 or 3 sessions per day.      Use CPM for 3-4 weeks or until you are told to stop.     Call MD / Call 911    Complete by:  As directed   If you experience chest pain or shortness of breath, CALL 911 and be transported to the hospital emergency room.  If you develope a fever above 101 F, pus (white drainage) or increased drainage or redness at the wound, or calf pain, call your surgeon's office.     Change dressing    Complete by:  As directed   DO NOT CHANGE YOUR DRESSING.     Constipation Prevention    Complete by:  As directed   Drink  plenty of fluids.  Prune juice may be helpful.  You may use a stool softener, such as Colace (over the counter) 100 mg twice a day.  Use MiraLax (over the counter) for constipation as needed.     Diet general    Complete by:  As directed      Discharge instructions    Complete by:  As directed   INSTRUCTIONS AFTER JOINT REPLACEMENT   Remove items at home which could result in a fall. This includes throw rugs or furniture in walking pathways ICE to the affected joint every three hours while awake for 30 minutes at a time, for at least the first 3-5 days, and then as needed for pain and swelling.  Continue to use ice for pain and swelling. You may notice swelling that will progress down to the foot and ankle.  This is normal after surgery.  Elevate your leg when you are not up walking on it.   Continue to use the breathing machine you got in the hospital (incentive spirometer) which will help keep your temperature down.  It is common for your temperature to cycle up and down following surgery, especially at night when you are not up moving around and exerting yourself.  The breathing machine keeps your lungs expanded and your temperature down.   DIET:  As you were doing prior to hospitalization, we recommend a well-balanced diet.  DRESSING / WOUND CARE / SHOWERING  Keep the surgical dressing until follow up.  The dressing is water proof, so you can shower without any extra covering.  IF THE DRESSING FALLS OFF or the wound gets wet inside, change the dressing with sterile gauze.  Please use good hand washing techniques before changing the dressing.  Do not use any lotions or creams on the incision until instructed by your surgeon.    ACTIVITY  Increase activity slowly as tolerated, but follow the weight bearing instructions below.   No driving for 6 weeks or until further direction given by your physician.  You cannot drive while taking narcotics.  No lifting or carrying greater than 10 lbs. until  further directed by your surgeon. Avoid periods of inactivity such as sitting longer than an hour when not asleep. This helps prevent blood clots.  You may return to work once you are authorized by your doctor.     WEIGHT BEARING   Weight bearing as tolerated with assist device (walker, cane, etc) as directed, use it as long as suggested by your surgeon or therapist, typically at least 4-6 weeks.   EXERCISES  Results after joint replacement surgery are often greatly improved when you follow the exercise, range of motion and muscle strengthening exercises prescribed by your doctor. Safety measures are also important to protect the joint from further injury. Any time any of these exercises cause you to have increased pain or swelling, decrease what you are doing until you are comfortable again and then slowly increase them. If you have problems or questions, call your caregiver or physical therapist for advice.   Rehabilitation is important following a joint replacement. After just a few days of immobilization, the muscles of the leg can become weakened and shrink (atrophy).  These exercises are designed to build up the tone and strength of the thigh and leg muscles and to improve motion. Often times heat used for twenty to thirty minutes before working out will loosen up your tissues and help with improving the range of motion but do not use heat for the first two weeks following surgery (sometimes heat can increase post-operative swelling).   These exercises can be done on a training (exercise) mat, on the floor, on a table or on a bed. Use whatever works the best and is most comfortable for you.    Use music or television while you are exercising so that the exercises are a pleasant break in your day. This will make your life better with the exercises acting as a break in your routine that you can look forward to.   Perform all exercises about fifteen times, three times per day or as directed.  You  should exercise both the operative leg and the other leg as well.   Exercises include:  Quad Sets - Tighten up the muscle on the front of the thigh (Quad) and hold for 5-10 seconds.   Straight Leg Raises - With your knee straight (if you were given a brace, keep it on), lift the leg to 60 degrees, hold for 3 seconds, and slowly lower the leg.  Perform this exercise against resistance later as your leg gets stronger.  Leg Slides: Lying on your back, slowly slide your foot toward your buttocks, bending your knee up off the floor (only go as far as is comfortable). Then slowly slide your foot back down until your leg is flat on the floor again.  Angel Wings: Lying on your back spread your legs to the side as far apart as you can without causing discomfort.  Hamstring Strength:  Lying on your back, push your heel against the floor  with your leg straight by tightening up the muscles of your buttocks.  Repeat, but this time bend your knee to a comfortable angle, and push your heel against the floor.  You may put a pillow under the heel to make it more comfortable if necessary.   A rehabilitation program following joint replacement surgery can speed recovery and prevent re-injury in the future due to weakened muscles. Contact your doctor or a physical therapist for more information on knee rehabilitation.    CONSTIPATION  Constipation is defined medically as fewer than three stools per week and severe constipation as less than one stool per week.  Even if you have a regular bowel pattern at home, your normal regimen is likely to be disrupted due to multiple reasons following surgery.  Combination of anesthesia, postoperative narcotics, change in appetite and fluid intake all can affect your bowels.   YOU MUST use at least one of the following options; they are listed in order of increasing strength to get the job done.  They are all available over the counter, and you may need to use some, POSSIBLY even  all of these options:    Drink plenty of fluids (prune juice may be helpful) and high fiber foods Colace 100 mg by mouth twice a day  Senokot for constipation as directed and as needed Dulcolax (bisacodyl), take with full glass of water  Miralax (polyethylene glycol) once or twice a day as needed.  If you have tried all these things and are unable to have a bowel movement in the first 3-4 days after surgery call either your surgeon or your primary doctor.    If you experience loose stools or diarrhea, hold the medications until you stool forms back up.  If your symptoms do not get better within 1 week or if they get worse, check with your doctor.  If you experience "the worst abdominal pain ever" or develop nausea or vomiting, please contact the office immediately for further recommendations for treatment.   ITCHING:  If you experience itching with your medications, try taking only a single pain pill, or even half a pain pill at a time.  You can also use Benadryl over the counter for itching or also to help with sleep.   TED HOSE STOCKINGS:  Use stockings on both legs until for at least 2 weeks or as directed by physician office. They may be removed at night for sleeping.  MEDICATIONS:  See your medication summary on the "After Visit Summary" that nursing will review with you.  You may have some home medications which will be placed on hold until you complete the course of blood thinner medication.  It is important for you to complete the blood thinner medication as prescribed.  PRECAUTIONS:  If you experience chest pain or shortness of breath - call 911 immediately for transfer to the hospital emergency department.   If you develop a fever greater that 101 F, purulent drainage from wound, increased redness or drainage from wound, foul odor from the wound/dressing, or calf pain - CONTACT YOUR SURGEON.                                                   FOLLOW-UP APPOINTMENTS:  If you do not  already have a post-op appointment, please call the office for an appointment to be  seen by your surgeon.  Guidelines for how soon to be seen are listed in your "After Visit Summary", but are typically between 1-4 weeks after surgery.  OTHER INSTRUCTIONS:   Knee Replacement:  Do not place pillow under knee, focus on keeping the knee straight while resting. CPM instructions: 0-90 degrees, 2 hours in the morning, 2 hours in the afternoon, and 2 hours in the evening. Place foam block, curve side up under heel at all times except when in CPM or when walking.  DO NOT modify, tear, cut, or change the foam block in any way.  MAKE SURE YOU:  Understand these instructions.  Get help right away if you are not doing well or get worse.    Thank you for letting us be a part of your medical care team.  It is a privilege we respect greatly.  We hope these instructions will help you stay on track for a fast and full recovery!  PLEASE CONTACT CARDIOLOGIST FOR COUMADIN DOSING!!   HOLD DOSE TODAY (03/20/2015)     Do not put a pillow under the knee. Place it under the heel.    Complete by:  As directed      Driving restrictions    Complete by:  As directed   No driving for 6 weeks     Increase activity slowly as tolerated    Complete by:  As directed      Lifting restrictions    Complete by:  As directed   No lifting for 6 weeks     Patient may shower    Complete by:  As directed   You may shower over your dressing     TED hose    Complete by:  As directed   Use stockings (TED hose) for 3 weeks on left  leg.  You may remove them at night for sleeping.     Weight bearing as tolerated    Complete by:  As directed            DISCHARGE MEDICATIONS:     Medication List    STOP taking these medications        HYDROcodone-acetaminophen 5-325 MG per tablet  Commonly known as:  NORCO/VICODIN      TAKE these medications        allopurinol 300 MG tablet  Commonly known as:  ZYLOPRIM  Take 300 mg  by mouth daily.     colchicine 0.6 MG tablet  Take 0.6 mg by mouth daily.     doxazosin 8 MG tablet  Commonly known as:  CARDURA  Take 8 mg by mouth at bedtime.     furosemide 40 MG tablet  Commonly known as:  LASIX  Take 0.5 tablets (20 mg total) by mouth daily.     lisinopril 40 MG tablet  Commonly known as:  PRINIVIL,ZESTRIL  TAKE ONE TABLET BY MOUTH DAILY.     metoprolol 50 MG tablet  Commonly known as:  LOPRESSOR  TAKE (1) TABLET BY MOUTH TWICE DAILY.     multivitamin tablet  Take 1 tablet by mouth daily.     omeprazole 20 MG capsule  Commonly known as:  PRILOSEC  Take 20 mg by mouth daily.     oxyCODONE 5 MG immediate release tablet  Commonly known as:  Oxy IR/ROXICODONE  Take 1 tablet (5 mg total) by mouth every 4 (four) hours as needed for moderate pain or severe pain.     polyethylene glycol packet  Commonly  known as:  MIRALAX / GLYCOLAX  Take 17 g by mouth daily.     potassium chloride 10 MEQ tablet  Commonly known as:  K-DUR  TAKE ONE TABLET BY MOUTH DAILY.     PRESERVISION AREDS PO  Take by mouth.     simvastatin 40 MG tablet  Commonly known as:  ZOCOR  TAKE (1) TABLET BY MOUTH AT BEDTIME FOR CHOLESTEROL.     warfarin 5 MG tablet  Commonly known as:  COUMADIN  Take 1 tablet (5 mg total) by mouth daily at 6 PM. As instructed by Cardiologist Dr. Mayford Knife.   Please call them for dosing.  DO NOT GIVE ANY TODAY (03/20/2015)        FOLLOW UP VISIT:       Follow-up Information    Follow up with [facility], Claude Manges, MD. Schedule an appointment as soon as possible for a visit on 04/02/2015.   Specialty:  Orthopedic Surgery   Contact information:   [address] [place] [address] [phone]       DISPOSITION:   Skilled Nursing Facility/Rehab  CONDITION:  Stable   Oris Drone. Aleda Grana [facility] [phone]  03/20/2015 12:25 PM

## 2015-03-20 NOTE — Care Management Note (Signed)
Case Management Note  Patient Details  Name: James [name] MRN: [id number] Date of Birth: [birth year]  Subjective/Objective:    81 y.o. M who underwent L Knee revision 03/18/2015. Continues to be 3+ assist with mobility and therefore will be discharged to SNF for further rehab. Anticipate discharge later today.                 Action/Plan: Discharge to [facility] today. No further CM needs.    Expected Discharge Date:                  Expected Discharge Plan:  Skilled Nursing Facility  In-House Referral:  Clinical Social Work  Discharge planning Services  CM Consult  Post Acute Care Choice:    Choice offered to:     DME Arranged:    DME Agency:     HH Arranged:    HH Agency:     Status of Service:  Completed, signed off  Medicare Important Message Given:    Date Medicare IM Given:    Medicare IM give by:    Date Additional Medicare IM Given:    Additional Medicare Important Message give by:     If discussed at Long Length of Stay Meetings, dates discussed:    Additional Comments:  Yvone Neu, RN 03/20/2015, 12:00 PM

## 2015-03-20 NOTE — Progress Notes (Signed)
Patient ID: James [name], male   DOB: 1922/03/14, 81 y.o.   MRN: [id number] PATIENT ID: James [name]        MRN:  [id number]          DOB/AGE: 1921/09/21 / 81 y.o.    James Campbell, MD   Jacqualine Code, PA-C [address] [place], [place]  [address]                             [phone]   PROGRESS NOTE  Subjective:  negative for Chest Pain  negative for Shortness of Breath  negative for Nausea/Vomiting   negative for Calf Pain    Tolerating Diet: yes         Patient reports pain as mild.     Sitting up in chair- comfortable  Objective: Vital signs in last 24 hours:   Patient Vitals for the past 24 hrs:  BP Temp Temp src Pulse Resp SpO2  03/20/15 1049 (!) 127/50 mmHg - - 65 - -  03/20/15 0531 (!) 155/82 mmHg 98.3 F (36.8 C) Oral 66 17 97 %  03/19/15 2107 136/69 mmHg 98.5 F (36.9 C) Oral 74 17 97 %  03/19/15 1808 (!) 143/75 mmHg - - - - 97 %  03/19/15 1305 - 98.7 F (37.1 C) Oral 70 - 100 %      Intake/Output from previous day:   09/21 0701 - 09/22 0700 In: 2045 [P.O.:600; I.V.:1095] Out: 451 [Urine:451]   Intake/Output this shift:   09/22 0701 - 09/22 1900 In: 240 [P.O.:240] Out: 100 [Urine:100]   Intake/Output      09/21 0701 - 09/22 0700 09/22 0701 - 09/23 0700   P.O. 600 240   I.V. (mL/kg) 1095 (12)    IV Piggyback 350    Total Intake(mL/kg) 2045 (22.4) 240 (2.6)   Urine (mL/kg/hr) 451 (0.2) 100 (0.2)   Drains     Blood     Total Output 451 100   Net +1594 +140        Urine Occurrence 1 x       LABORATORY DATA:  Recent Labs  03/18/15 1320 03/19/15 0455 03/20/15 0412 03/20/15 1111  WBC 13.2* 13.0* 10.6* 11.2*  HGB 11.7* 9.9* 8.3* 8.9*  HCT 35.5* 30.1* 25.0* 27.6*  PLT 148* 152 131* 128*    Recent Labs  03/18/15 1320 03/19/15 0455 03/20/15 0412  NA 139 134* 136  K 3.7 4.2 4.0  CL 104 101 103  CO2 BUN CREATININE 1.22 1.34* 1.50*  GLUCOSE 165* 208* 154*  CALCIUM 9.0 8.6* 8.5*   Lab Results  Component  Value Date   INR 3.30* 03/20/2015   INR 2.01* 03/19/2015   INR 1.51* 03/18/2015    Recent Radiographic Studies :  Dg Chest 2 View  03/12/2015   CLINICAL DATA:  Preop exam prior to surgery. History of kidney disease and hypertension. Patient for total knee revision.  EXAM: CHEST  2 VIEW  COMPARISON:  09/19/2006  FINDINGS: Cardiac silhouette is mildly enlarged. The aorta is uncoiled. No mediastinal or hilar masses or evidence of adenopathy.  Posterior lateral left hemidiaphragm is elevated. There is left lower lung zone coarse reticular type opacities consistent with scarring similar to the prior study. Remainder of the lungs is clear. No pleural effusion or pneumothorax.  Bony thorax is demineralized but grossly intact.  IMPRESSION: No acute cardiopulmonary disease.  Electronically Signed   By: Amie Portland M.D.   On: 03/12/2015 14:38   Dg Knee Complete 4 Views Left  03/18/2015   CLINICAL DATA:  Surgery.  Left total knee revision.  EXAM: DG C-ARM 61-120 MIN; LEFT KNEE - COMPLETE 4+ VIEW  COMPARISON:  None.  FINDINGS: Intraoperative spot fluoroscopic views of the left knee show changes of total knee arthroplasty with long-stem hardware in the femur and proximal tibia. Although bony detail is limited, no periprosthetic fracture is identified.  IMPRESSION: Intraoperative radiographs obtained during left knee arthroplasty revision.   Electronically Signed   By: Britta Mccreedy M.D.   On: 03/18/2015 11:29   Dg C-arm 1-60 Min  03/18/2015   CLINICAL DATA:  Surgery.  Left total knee revision.  EXAM: DG C-ARM 61-120 MIN; LEFT KNEE - COMPLETE 4+ VIEW  COMPARISON:  None.  FINDINGS: Intraoperative spot fluoroscopic views of the left knee show changes of total knee arthroplasty with long-stem hardware in the femur and proximal tibia. Although bony detail is limited, no periprosthetic fracture is identified.  IMPRESSION: Intraoperative radiographs obtained during left knee arthroplasty revision.   Electronically  Signed   By: Britta Mccreedy M.D.   On: 03/18/2015 11:29     Examination:  General appearance: alert, cooperative and no distress  Wound Exam: clean, dry, intact   Drainage:  None: wound tissue dry  Motor Exam: EHL, FHL, Anterior Tibial and Posterior Tibial Intact  Sensory Exam: Superficial Peroneal, Deep Peroneal and Tibial normal  Vascular Exam: Normal-no change from pre op  Assessment:    2 Days Post-Op  Procedure(s) (LRB): TOTAL KNEE REVISION (Left)  ADDITIONAL DIAGNOSIS:  Principal Problem:   Failed total left knee replacement Active Problems:   S/P total knee replacement using cement  Acute Blood Loss Anemia-asymptomatic,   Plan: Physical Therapy as ordered Weight Bearing as Tolerated (WBAT)  DVT Prophylaxis:  Coumadin and TED hose  DISCHARGE PLAN: Skilled Nursing Facility/Rehab[facility] in [place]  DISCHARGE NEEDS: CPM, Walker and 3-in-1 comode seat  Sitting up in chair without related problems-dressing changed, wound clean and dry, INR 3.3- will need coumadin adjustment at rehab per cardiology. OK for rehab    Valeria Batman  03/20/2015 12:02 PM

## 2015-03-20 NOTE — Progress Notes (Signed)
ANTICOAGULATION CONSULT NOTE - Follow Up Consult  Pharmacy Consult for Coumadin Indication: PE history / CVA history / s/p left total knee revision  Allergies  Allergen Reactions  . No Known Allergies     Patient Measurements: Height:  (170.2 cm) Weight: 201 lb (91.173 kg) IBW/kg (Calculated) : 66.1  Vital Signs: Temp: 98.3 F (36.8 C) (09/22 0531) Temp Source: Oral (09/22 0531) BP: 127/50 mmHg (09/22 1049) Pulse Rate: 65 (09/22 1049)  Labs:  Recent Labs  03/18/15 2952  03/18/15 1320 03/19/15 0455 03/20/15 0412 03/20/15 1111  HGB  --   < > 11.7* 9.9* 8.3* 8.9*  HCT  --   < > 35.5* 30.1* 25.0* 27.6*  PLT  --   < > 148* 152 131* 128*  LABPROT 18.3*  --   --  22.6* 32.9*  --   INR 1.51*  --   --  2.01* 3.30*  --   CREATININE  --   --  1.22 1.34* 1.50*  --   < > = values in this interval not displayed.  Estimated Creatinine Clearance: 34.5 mL/min (by C-G formula based on Cr of 1.5).   Medications:  Prescriptions prior to admission  Medication Sig Dispense Refill Last Dose  . allopurinol (ZYLOPRIM) 300 MG tablet Take 300 mg by mouth daily.     03/17/2015 at Unknown time  . colchicine 0.6 MG tablet Take 0.6 mg by mouth daily.   03/17/2015 at Unknown time  . doxazosin (CARDURA) 8 MG tablet Take 8 mg by mouth at bedtime.     03/17/2015 at Unknown time  . furosemide (LASIX) 40 MG tablet Take 0.5 tablets (20 mg total) by mouth daily. 90 tablet 3 03/17/2015 at Unknown time  . HYDROcodone-acetaminophen (NORCO/VICODIN) 5-325 MG per tablet Take 1 tablet by mouth every 6 (six) hours as needed for moderate pain.   03/17/2015 at Unknown time  . lisinopril (PRINIVIL,ZESTRIL) 40 MG tablet TAKE ONE TABLET BY MOUTH DAILY. 30 tablet 11 Taking  . metoprolol (LOPRESSOR) 50 MG tablet TAKE (1) TABLET BY MOUTH TWICE DAILY. 60 tablet 3 03/18/2015 at 0400  . Multiple Vitamin (MULTIVITAMIN) tablet Take 1 tablet by mouth daily.     03/12/15  . Multiple Vitamins-Minerals (PRESERVISION AREDS PO)  Take by mouth.   03/12/15  . omeprazole (PRILOSEC) 20 MG capsule Take 20 mg by mouth daily.     03/17/2015 at Unknown time  . polyethylene glycol (MIRALAX / GLYCOLAX) packet Take 17 g by mouth daily.     03/17/2015 at Unknown time  . potassium chloride (K-DUR) 10 MEQ tablet TAKE ONE TABLET BY MOUTH DAILY. 30 tablet 11 03/17/2015 at Unknown time  . simvastatin (ZOCOR) 40 MG tablet TAKE (1) TABLET BY MOUTH AT BEDTIME FOR CHOLESTEROL. 30 tablet 9 03/17/2015 at Unknown time  . [DISCONTINUED] warfarin (COUMADIN) 5 MG tablet Take 5 mg by mouth daily.   03/12/15  . warfarin (COUMADIN) 5 MG tablet Take 1 tablet (5 mg total) by mouth as directed. (Patient taking differently: Take 5 mg by mouth daily. ) 30 tablet 11 03/12/15    Assessment: 81 yo M on Coumadin PTA now s/p TKA.  INR today = 3.3.  Hgb stable, no bleeding noted.  Goal of Therapy:  INR 2-3 Monitor platelets by anticoagulation protocol: Yes   Plan:  No Coumadin tonight. Continue daily INR.  [employer], Pharm.D., BCPS Clinical Pharmacist Pager [phone] 03/20/2015 1:58 PM

## 2015-03-21 ENCOUNTER — Encounter (HOSPITAL_COMMUNITY)
Admission: AD | Admit: 2015-03-21 | Discharge: 2015-03-21 | Disposition: A | Discharge status: Home / Self Care | Payer: Medicare Other | Source: Skilled Nursing Facility | Attending: Pulmonary Disease | Admitting: Pulmonary Disease

## 2015-03-21 ENCOUNTER — Ambulatory Visit (INDEPENDENT_AMBULATORY_CARE_PROVIDER_SITE_OTHER): Payer: Medicare Other | Admitting: Cardiology

## 2015-03-21 DIAGNOSIS — I482 Chronic atrial fibrillation, unspecified: Secondary | ICD-10-CM

## 2015-03-21 DIAGNOSIS — Z5181 Encounter for therapeutic drug level monitoring: Secondary | ICD-10-CM

## 2015-03-21 LAB — BODY FLUID CULTURE: Culture: NO GROWTH

## 2015-03-21 LAB — PROTIME-INR
INR: 2.86 — ABNORMAL HIGH (ref 0.00–1.49)
Prothrombin Time: 29.5 seconds — ABNORMAL HIGH (ref 11.6–15.2)

## 2015-03-23 LAB — ANAEROBIC CULTURE

## 2015-03-25 ENCOUNTER — Encounter (HOSPITAL_COMMUNITY)
Admission: AD | Admit: 2015-03-25 | Discharge: 2015-03-25 | Disposition: A | Discharge status: Home / Self Care | Payer: Medicare Other | Source: Skilled Nursing Facility | Attending: Internal Medicine | Admitting: Internal Medicine

## 2015-03-25 LAB — PROTIME-INR
INR: 2.54 — ABNORMAL HIGH (ref 0.00–1.49)
PROTHROMBIN TIME: 27 s — AB (ref 11.6–15.2)

## 2015-03-26 NOTE — H&P (Signed)
James [name] MRN: [id number] DOB/AGE: 28-Mar-1922 81 y.o. Primary Care Physician:HAWKINS,EDWARD L, MD Admit date: 03/20/2015 Chief Complaint: For rehabilitation HPI: This is documentation of my history and physical performed in the skilled care facility on 03/24/2015. He is a 81 year old who had been having increasing problems with instability of his leg and who had fallen on multiple occasions. He had a total knee replacement several years ago and underwent a revision of that at [facility] and was sent to the nursing facility for rehabilitation. He says he feels okay. He has no new complaints. At baseline he has a history of chronic atrial fibrillation on chronic anticoagulation previous stroke and cardiac disease history of skin infections and severe arthritis. He has had multiple falls recently that is felt to be because his knee is unstable.  Past Medical History  Diagnosis Date  . Atrial fibrillation, chronic     Permanent atrial fibrillation  . Anemia     H&H of 12.4/38.5 with MCV of 110 in /2012  . Hypertension     Lab 05/2011: Normal CMet except glucose of 121 and creatinine of 1.37  . TOTAL KNEE REVISIONLeft   . Hyperlipidemia     Lipid profile in 05/2011:138, 120, 34, 80  . Cerebrovascular disease     Left posterior parietal infarction  . Chronic anticoagulation     H&H of 12.4/38.5 in 05/2011; negative stool Hemoccults in 05/2009  . Fasting hyperglycemia     Glucose values of 70-135 in 2010-2012  . Herpes zoster 2011  . Dysrhythmia   . Stroke   . Pulmonary embolism   . Deep vein thrombosis (DVT)   . History of kidney stones   . GERD (gastroesophageal reflux disease)   . Arthritis    Past Surgical History  Procedure Laterality Date  . Renal artery stent    . Hip fracture surgery    . Cholecystectomy    . Appendectomy    . Hemorrhoid surgery    . Joint replacement    . Eye surgery Bilateral   . Colonoscopy    . Wisdom tooth extraction    . Total knee  revision Left 03/18/2015    Procedure: TOTAL KNEE REVISION;  Surgeon: Valeria Batman, MD;  Location: [facility] OR;  Service: Orthopedics;  Laterality: Left;        Family History  Problem Relation Age of Onset  . Cancer Mother     Social History:  reports that he has never smoked. He has never used smokeless tobacco. He reports that he does not drink alcohol or use illicit drugs.   Allergies:  Allergies  Allergen Reactions  . No Known Allergies     Medications Prior to Admission  Medication Sig Dispense Refill  . allopurinol (ZYLOPRIM) 300 MG tablet Take 300 mg by mouth daily.      . colchicine 0.6 MG tablet Take 0.6 mg by mouth daily.    Marland Kitchen doxazosin (CARDURA) 8 MG tablet Take 8 mg by mouth at bedtime.      . furosemide (LASIX) 40 MG tablet Take 0.5 tablets (20 mg total) by mouth daily. 90 tablet 3  . lisinopril (PRINIVIL,ZESTRIL) 40 MG tablet TAKE ONE TABLET BY MOUTH DAILY. 30 tablet 11  . metoprolol (LOPRESSOR) 50 MG tablet TAKE (1) TABLET BY MOUTH TWICE DAILY. 60 tablet 3  . Multiple Vitamin (MULTIVITAMIN) tablet Take 1 tablet by mouth daily.      . Multiple Vitamins-Minerals (PRESERVISION AREDS PO) Take by mouth.    Marland Kitchen  omeprazole (PRILOSEC) 20 MG capsule Take 20 mg by mouth daily.      Marland Kitchen oxyCODONE (OXY IR/ROXICODONE) 5 MG immediate release tablet Take 1 tablet (5 mg total) by mouth every 4 (four) hours as needed for moderate pain or severe pain. 30 tablet 0  . polyethylene glycol (MIRALAX / GLYCOLAX) packet Take 17 g by mouth daily.      . potassium chloride (K-DUR) 10 MEQ tablet TAKE ONE TABLET BY MOUTH DAILY. 30 tablet 11  . simvastatin (ZOCOR) 40 MG tablet TAKE (1) TABLET BY MOUTH AT BEDTIME FOR CHOLESTEROL. 30 tablet 9  . warfarin (COUMADIN) 5 MG tablet Take 1 tablet (5 mg total) by mouth daily at 6 PM. As instructed by Cardiologist Dr. Mayford Knife.   Please call them for dosing.  DO NOT GIVE ANY TODAY (03/20/2015) 10 tablet 0       WUJ:WJXBJ from the symptoms mentioned  above,there are no other symptoms referable to all systems reviewed.  Physical Exam: There were no vitals taken for this visit. He is awake and alert. His pupils are reactive and nose and throat are clear. His neck is supple without masses. His chest is clear. His heart is irregularly irregular but he does not have a gallop. His abdomen is soft and obese without masses. Surgical scar looks okay. He has no edema. Central nervous system examination is grossly intact   No results for input(s): WBC, NEUTROABS, HGB, HCT, MCV, PLT in the last 72 hours. No results for input(s): NA, K, CL, CO2, GLUCOSE, BUN, CREATININE, CALCIUM, MG in the last 72 hours.  Invalid input(s): PHOlablast2(ast:2,ALT:2,alkphos:2,bilitot:2,prot:2,albumin:2)@    Recent Results (from the past 240 hour(s))  Anaerobic culture     Status: None   Collection Time: 03/18/15  8:25 AM  Result Value Ref Range Status   Specimen Description FLUID KNEE LEFT SYNOVIAL  Final   Special Requests LOOSENED LEFT KNEE ARTHROPLASTY ON SWAB  Final   Gram Stain   Final    RARE WBC PRESENT, PREDOMINANTLY MONONUCLEAR NO ORGANISMS SEEN    Culture NO ANAEROBES ISOLATED  Final   Report Status 03/23/2015 FINAL  Final  Body fluid culture     Status: None   Collection Time: 03/18/15  8:25 AM  Result Value Ref Range Status   Specimen Description FLUID KNEE LEFT SYNOVIAL  Final   Special Requests LOOSENED LEFT KNEE ARTHROPLASTY ON SWAB  Final   Gram Stain   Final    RARE WBC PRESENT, PREDOMINANTLY MONONUCLEAR NO ORGANISMS SEEN    Culture NO GROWTH 3 DAYS  Final   Report Status 03/21/2015 FINAL  Final     Dg Chest 2 View  03/12/2015   CLINICAL DATA:  Preop exam prior to surgery. History of kidney disease and hypertension. Patient for total knee revision.  EXAM: CHEST  2 VIEW  COMPARISON:  09/19/2006  FINDINGS: Cardiac silhouette is mildly enlarged. The aorta is uncoiled. No mediastinal or hilar masses or evidence of adenopathy.  Posterior  lateral left hemidiaphragm is elevated. There is left lower lung zone coarse reticular type opacities consistent with scarring similar to the prior study. Remainder of the lungs is clear. No pleural effusion or pneumothorax.  Bony thorax is demineralized but grossly intact.  IMPRESSION: No acute cardiopulmonary disease.   Electronically Signed   By: Amie Portland M.D.   On: 03/12/2015 14:38   Dg Knee Complete 4 Views Left  03/18/2015   CLINICAL DATA:  Surgery.  Left total knee revision.  EXAM: DG  C-ARM 61-120 MIN; LEFT KNEE - COMPLETE 4+ VIEW  COMPARISON:  None.  FINDINGS: Intraoperative spot fluoroscopic views of the left knee show changes of total knee arthroplasty with long-stem hardware in the femur and proximal tibia. Although bony detail is limited, no periprosthetic fracture is identified.  IMPRESSION: Intraoperative radiographs obtained during left knee arthroplasty revision.   Electronically Signed   By: Britta Mccreedy M.D.   On: 03/18/2015 11:29   Dg C-arm 1-60 Min  03/18/2015   CLINICAL DATA:  Surgery.  Left total knee revision.  EXAM: DG C-ARM 61-120 MIN; LEFT KNEE - COMPLETE 4+ VIEW  COMPARISON:  None.  FINDINGS: Intraoperative spot fluoroscopic views of the left knee show changes of total knee arthroplasty with long-stem hardware in the femur and proximal tibia. Although bony detail is limited, no periprosthetic fracture is identified.  IMPRESSION: Intraoperative radiographs obtained during left knee arthroplasty revision.   Electronically Signed   By: Britta Mccreedy M.D.   On: 03/18/2015 11:29   Impression: He had revision of a previous knee replacement. He is here for rehabilitation. Additionally he has hypertension, chronic atrial fibrillation, previous stroke and history of skin infections. Active Problems:   * No active hospital problems. *     Plan: Continue with rehabilitation and home when he improves      HAWKINS,EDWARD L   03/26/2015, 8:15 AM

## 2015-03-27 ENCOUNTER — Encounter (HOSPITAL_COMMUNITY): Payer: Self-pay | Admitting: Orthopaedic Surgery

## 2015-03-28 ENCOUNTER — Telehealth: Payer: Self-pay | Admitting: Student

## 2015-03-28 ENCOUNTER — Encounter (HOSPITAL_COMMUNITY)
Admission: RE | Admit: 2015-03-28 | Discharge: 2015-03-28 | Disposition: A | Discharge status: Home / Self Care | Payer: Medicare Other | Source: Skilled Nursing Facility | Attending: Pulmonary Disease | Admitting: Pulmonary Disease

## 2015-03-28 ENCOUNTER — Telehealth: Payer: Self-pay | Admitting: Cardiovascular Disease

## 2015-03-28 LAB — PROTIME-INR
INR: 2.51 — ABNORMAL HIGH (ref 0.00–1.49)
PROTHROMBIN TIME: 26.8 s — AB (ref 11.6–15.2)

## 2015-03-28 NOTE — Telephone Encounter (Signed)
James [name] is needing coumadin orders sent in for this pt--she can be reached [phone]

## 2015-03-28 NOTE — Telephone Encounter (Signed)
Received call from [facility] needing verification for patient's coumadin orders. He is on Coumadin for chronic atrial fibrillation.   INR checked today and was 2.51. Will continue Coumadin dosing at current regimen of 2.5mg  Tuesday - Sunday, with no dose on Monday.   Will recheck INR in 2 weeks. Verified with Mariama at [facility].  Signed, Ellsworth Lennox, PA-C 03/28/2015, 5:52 PM Pager: [phone]

## 2015-03-28 NOTE — Telephone Encounter (Signed)
Forwarded to Coumadin clinic in [place],as they cover for Vashti Hey RN of [employer]

## 2015-03-31 ENCOUNTER — Emergency Department (HOSPITAL_COMMUNITY): Payer: Medicare Other

## 2015-03-31 ENCOUNTER — Encounter (HOSPITAL_COMMUNITY): Payer: Self-pay | Admitting: *Deleted

## 2015-03-31 ENCOUNTER — Emergency Department (HOSPITAL_COMMUNITY)
Admission: EM | Admit: 2015-03-31 | Discharge: 2015-03-31 | Disposition: A | Discharge status: Home / Self Care | Payer: Medicare Other | Attending: Emergency Medicine | Admitting: Emergency Medicine

## 2015-03-31 DIAGNOSIS — Z79899 Other long term (current) drug therapy: Secondary | ICD-10-CM | POA: Insufficient documentation

## 2015-03-31 DIAGNOSIS — Z862 Personal history of diseases of the blood and blood-forming organs and certain disorders involving the immune mechanism: Secondary | ICD-10-CM | POA: Insufficient documentation

## 2015-03-31 DIAGNOSIS — E785 Hyperlipidemia, unspecified: Secondary | ICD-10-CM | POA: Insufficient documentation

## 2015-03-31 DIAGNOSIS — Z7901 Long term (current) use of anticoagulants: Secondary | ICD-10-CM | POA: Insufficient documentation

## 2015-03-31 DIAGNOSIS — I10 Essential (primary) hypertension: Secondary | ICD-10-CM | POA: Insufficient documentation

## 2015-03-31 DIAGNOSIS — Z87442 Personal history of urinary calculi: Secondary | ICD-10-CM | POA: Insufficient documentation

## 2015-03-31 DIAGNOSIS — K5900 Constipation, unspecified: Secondary | ICD-10-CM | POA: Diagnosis present

## 2015-03-31 DIAGNOSIS — Z8673 Personal history of transient ischemic attack (TIA), and cerebral infarction without residual deficits: Secondary | ICD-10-CM | POA: Diagnosis not present

## 2015-03-31 DIAGNOSIS — Z86718 Personal history of other venous thrombosis and embolism: Secondary | ICD-10-CM | POA: Insufficient documentation

## 2015-03-31 DIAGNOSIS — J9811 Atelectasis: Secondary | ICD-10-CM | POA: Diagnosis not present

## 2015-03-31 DIAGNOSIS — M1990 Unspecified osteoarthritis, unspecified site: Secondary | ICD-10-CM | POA: Insufficient documentation

## 2015-03-31 DIAGNOSIS — Z86711 Personal history of pulmonary embolism: Secondary | ICD-10-CM | POA: Insufficient documentation

## 2015-03-31 DIAGNOSIS — K219 Gastro-esophageal reflux disease without esophagitis: Secondary | ICD-10-CM | POA: Insufficient documentation

## 2015-03-31 LAB — PROTIME-INR
INR: 2.27 — ABNORMAL HIGH (ref 0.00–1.49)
PROTHROMBIN TIME: 24.8 s — AB (ref 11.6–15.2)

## 2015-03-31 LAB — COMPREHENSIVE METABOLIC PANEL
ALBUMIN: 2.8 g/dL — AB (ref 3.5–5.0)
ALT: 27 U/L (ref 17–63)
AST: 31 U/L (ref 15–41)
Alkaline Phosphatase: 108 U/L (ref 38–126)
Anion gap: 6 (ref 5–15)
BUN: 18 mg/dL (ref 6–20)
CHLORIDE: 103 mmol/L (ref 101–111)
CO2: 27 mmol/L (ref 22–32)
Calcium: 8.3 mg/dL — ABNORMAL LOW (ref 8.9–10.3)
Creatinine, Ser: 1.06 mg/dL (ref 0.61–1.24)
GFR calc Af Amer: >60 mL/min (ref >60)
GFR, EST NON AFRICAN AMERICAN: 59 mL/min — AB (ref >60)
GLUCOSE: 133 mg/dL — AB (ref 65–99)
POTASSIUM: 3.2 mmol/L — AB (ref 3.5–5.1)
Sodium: 136 mmol/L (ref 135–145)
Total Bilirubin: 0.9 mg/dL (ref 0.3–1.2)
Total Protein: 6.2 g/dL — ABNORMAL LOW (ref 6.5–8.1)

## 2015-03-31 LAB — CBC WITH DIFFERENTIAL/PLATELET
BASOS ABS: 0 10*3/uL (ref 0.0–0.1)
BASOS PCT: 0 %
EOS PCT: 5 %
Eosinophils Absolute: 0.4 10*3/uL (ref 0.0–0.7)
HCT: 28.7 % — ABNORMAL LOW (ref 39.0–52.0)
Hemoglobin: 9.2 g/dL — ABNORMAL LOW (ref 13.0–17.0)
Lymphocytes Relative: 22 %
Lymphs Abs: 1.9 10*3/uL (ref 0.7–4.0)
MCH: 31.2 pg (ref 26.0–34.0)
MCHC: 32.1 g/dL (ref 30.0–36.0)
MCV: 97.3 fL (ref 78.0–100.0)
MONO ABS: 0.7 10*3/uL (ref 0.1–1.0)
Monocytes Relative: 8 %
Neutro Abs: 5.5 10*3/uL (ref 1.7–7.7)
Neutrophils Relative %: 65 %
PLATELETS: 249 10*3/uL (ref 150–400)
RBC: 2.95 MIL/uL — ABNORMAL LOW (ref 4.22–5.81)
RDW: 15 % (ref 11.5–15.5)
WBC: 8.6 10*3/uL (ref 4.0–10.5)

## 2015-03-31 LAB — POC OCCULT BLOOD, ED: FECAL OCCULT BLD: POSITIVE — AB

## 2015-03-31 LAB — I-STAT CG4 LACTIC ACID, ED: LACTIC ACID, VENOUS: 0.87 mmol/L (ref 0.5–2.0)

## 2015-03-31 MED ORDER — IOHEXOL 300 MG/ML  SOLN
100.0000 mL | Freq: Once | INTRAMUSCULAR | Status: AC | PRN
  Administered 2015-03-31: 100 mL via INTRAVENOUS

## 2015-03-31 MED ORDER — IOHEXOL 300 MG/ML  SOLN
25.0000 mL | Freq: Once | INTRAMUSCULAR | Status: AC | PRN
  Administered 2015-03-31: 25 mL via ORAL

## 2015-03-31 NOTE — ED Notes (Signed)
Pt brought over from [facility] for c/o constipation; pt states he had a BM tonight, but the staff at the [facility] told pt he needed to have another BM

## 2015-03-31 NOTE — Discharge Instructions (Signed)
Constipation Continue to use your incentive spirometer and bowel medicine. Followup with Dr. Juanetta Gosling. Return to the ED if you develop new or worsening symptoms. Constipation is when a person has fewer than three bowel movements a week, has difficulty having a bowel movement, or has stools that are dry, hard, or larger than normal. As people grow older, constipation is more common. If you try to fix constipation with medicines that make you have a bowel movement (laxatives), the problem may get worse. Long-term laxative use may cause the muscles of the colon to become weak. A low-fiber diet, not taking in enough fluids, and taking certain medicines may make constipation worse.  CAUSES   Certain medicines, such as antidepressants, pain medicine, iron supplements, antacids, and water pills.   Certain diseases, such as diabetes, irritable bowel syndrome (IBS), thyroid disease, or depression.   Not drinking enough water.   Not eating enough fiber-rich foods.   Stress or travel.   Lack of physical activity or exercise.   Ignoring the urge to have a bowel movement.   Using laxatives too much.  SIGNS AND SYMPTOMS   Having fewer than three bowel movements a week.   Straining to have a bowel movement.   Having stools that are hard, dry, or larger than normal.   Feeling full or bloated.   Pain in the lower abdomen.   Not feeling relief after having a bowel movement.  DIAGNOSIS  Your health care provider will take a medical history and perform a physical exam. Further testing may be done for severe constipation. Some tests may include:  A barium enema X-ray to examine your rectum, colon, and, sometimes, your small intestine.   A sigmoidoscopy to examine your lower colon.   A colonoscopy to examine your entire colon. TREATMENT  Treatment will depend on the severity of your constipation and what is causing it. Some dietary treatments include drinking more fluids and eating  more fiber-rich foods. Lifestyle treatments may include regular exercise. If these diet and lifestyle recommendations do not help, your health care provider may recommend taking over-the-counter laxative medicines to help you have bowel movements. Prescription medicines may be prescribed if over-the-counter medicines do not work.  HOME CARE INSTRUCTIONS   Eat foods that have a lot of fiber, such as fruits, vegetables, whole grains, and beans.  Limit foods high in fat and processed sugars, such as french fries, hamburgers, cookies, candies, and soda.   A fiber supplement may be added to your diet if you cannot get enough fiber from foods.   Drink enough fluids to keep your urine clear or pale yellow.   Exercise regularly or as directed by your health care provider.   Go to the restroom when you have the urge to go. Do not hold it.   Only take over-the-counter or prescription medicines as directed by your health care provider. Do not take other medicines for constipation without talking to your health care provider first.  SEEK IMMEDIATE MEDICAL CARE IF:   You have bright red blood in your stool.   Your constipation lasts for more than 4 days or gets worse.   You have abdominal or rectal pain.   You have thin, pencil-like stools.   You have unexplained weight loss. MAKE SURE YOU:   Understand these instructions.  Will watch your condition.  Will get help right away if you are not doing well or get worse. Document Released: 03/12/2004 Document Revised: 06/19/2013 Document Reviewed: 03/26/2013 [employer] Patient Information 2015  [employer]. This information is not intended to replace advice given to you by your health care provider. Make sure you discuss any questions you have with your health care provider.

## 2015-03-31 NOTE — ED Notes (Signed)
Dr. Manus Gunning okay with bp 180/95

## 2015-03-31 NOTE — ED Notes (Signed)
Patient able to retain po fluids. Did not attempt to ambulate patient due to left knee replacement

## 2015-03-31 NOTE — ED Notes (Signed)
Patient in room with family and denies pain and tells them he had a bowel movement 3 hrs ago and it was soft as usual and that his anus was not hurting. Stated he was sent to ED for another enema.

## 2015-03-31 NOTE — ED Provider Notes (Signed)
CSN: [id number]     Arrival date & time 03/31/15  0417 History   First MD Initiated Contact with Patient 03/31/15 [phone]     Chief Complaint  Patient presents with  . Constipation     (Consider location/radiation/quality/duration/timing/severity/associated sxs/prior Treatment) HPI Comments: Patient from nursing home with concern for constipation and "swollen rectum". Patient in rehabilitation after having knee replacement surgery on September 20. He is on Coumadin for history of atrial fibrillation and blood clots. Patient states he had a normal bowel movement today. He's been going normally. Denies any abdominal pain, fever or vomiting. No blood in the stool. He was told by the nursing home staff that he needed to have another bowel movement and there is concern that his rectum was "swollen". He denies any abdominal pain or rectal pain or back pain. No chest pain or shortness of breath. Still eating and drinking well. No fever.  Lived at home prior to knee surgery.  The history is provided by the patient, the EMS personnel and a relative.    Past Medical History  Diagnosis Date  . Atrial fibrillation, chronic (HCC)     Permanent atrial fibrillation  . Anemia     H&H of 12.4/38.5 with MCV of 110 in /2012  . Hypertension     Lab 05/2011: Normal CMet except glucose of 121 and creatinine of 1.37  . TOTAL KNEE REVISIONLeft   . Hyperlipidemia     Lipid profile in 05/2011:138, 120, 34, 80  . Cerebrovascular disease     Left posterior parietal infarction  . Chronic anticoagulation     H&H of 12.4/38.5 in 05/2011; negative stool Hemoccults in 05/2009  . Fasting hyperglycemia     Glucose values of 70-135 in 2010-2012  . Herpes zoster 2011  . Dysrhythmia   . Stroke (HCC)   . Pulmonary embolism (HCC)   . Deep vein thrombosis (DVT) (HCC)   . History of kidney stones   . GERD (gastroesophageal reflux disease)   . Arthritis    Past Surgical History  Procedure Laterality Date  . Renal  artery stent    . Hip fracture surgery    . Cholecystectomy    . Appendectomy    . Hemorrhoid surgery    . Joint replacement    . Eye surgery Bilateral   . Colonoscopy    . Wisdom tooth extraction    . Total knee revision Left 03/18/2015    Procedure: TOTAL KNEE REVISION;  Surgeon: Valeria Batman, MD;  Location: [facility] OR;  Service: Orthopedics;  Laterality: Left;   Family History  Problem Relation Age of Onset  . Cancer Mother    Social History  Substance Use Topics  . Smoking status: Never Smoker   . Smokeless tobacco: Never Used  . Alcohol Use: No    Review of Systems  Constitutional: Negative for fever, activity change and appetite change.  Respiratory: Negative for cough, chest tightness and shortness of breath.   Cardiovascular: Negative for chest pain.  Gastrointestinal: Positive for constipation. Negative for nausea, vomiting and abdominal pain.  Genitourinary: Negative for dysuria and hematuria.  Musculoskeletal: Negative for myalgias and arthralgias.  Skin: Negative for rash and wound.  Neurological: Negative for dizziness, weakness and headaches.  A complete 10 system review of systems was obtained and all systems are negative except as noted in the HPI and PMH.      Allergies  No known allergies  Home Medications   Prior to Admission medications   Medication  Sig Start Date End Date Taking? Authorizing Provider  allopurinol (ZYLOPRIM) 300 MG tablet Take 300 mg by mouth daily.      Historical Provider, MD  colchicine 0.6 MG tablet Take 0.6 mg by mouth daily.    Historical Provider, MD  doxazosin (CARDURA) 8 MG tablet Take 8 mg by mouth at bedtime.      Historical Provider, MD  furosemide (LASIX) 40 MG tablet Take 0.5 tablets (20 mg total) by mouth daily. 02/07/14   Jodelle Gross, NP  lisinopril (PRINIVIL,ZESTRIL) 40 MG tablet TAKE ONE TABLET BY MOUTH DAILY. 01/22/15   Jodelle Gross, NP  metoprolol (LOPRESSOR) 50 MG tablet TAKE (1) TABLET BY MOUTH TWICE  DAILY. 03/05/15   Laqueta Linden, MD  Multiple Vitamin (MULTIVITAMIN) tablet Take 1 tablet by mouth daily.      Historical Provider, MD  Multiple Vitamins-Minerals (PRESERVISION AREDS PO) Take by mouth.    Historical Provider, MD  omeprazole (PRILOSEC) 20 MG capsule Take 20 mg by mouth daily.      Historical Provider, MD  oxyCODONE (OXY IR/ROXICODONE) 5 MG immediate release tablet Take 1 tablet (5 mg total) by mouth every 4 (four) hours as needed for moderate pain or severe pain. 03/20/15   Jetty Peeks, PA-C  polyethylene glycol (MIRALAX / GLYCOLAX) packet Take 17 g by mouth daily.      Historical Provider, MD  potassium chloride (K-DUR) 10 MEQ tablet TAKE ONE TABLET BY MOUTH DAILY. 02/20/15   Jodelle Gross, NP  simvastatin (ZOCOR) 40 MG tablet TAKE (1) TABLET BY MOUTH AT BEDTIME FOR CHOLESTEROL. 01/06/15   Jodelle Gross, NP  warfarin (COUMADIN) 5 MG tablet Take 1 tablet (5 mg total) by mouth daily at 6 PM. As instructed by Cardiologist Dr. Mayford Knife.   Please call them for dosing.  DO NOT GIVE ANY TODAY (03/20/2015) 03/20/15   Arlys John D Petrarca, PA-C   BP 180/95 mmHg  Pulse 81  Temp(Src) 98.7 F (37.1 C) (Oral)  Resp 16  SpO2 97% Physical Exam  Constitutional: He is oriented to person, place, and time. He appears well-developed and well-nourished. No distress.  HENT:  Head: Normocephalic and atraumatic.  Mouth/Throat: Oropharynx is clear and moist. No oropharyngeal exudate.  Eyes: Conjunctivae and EOM are normal. Pupils are equal, round, and reactive to light.  Neck: Normal range of motion. Neck supple.  No meningismus.  Cardiovascular: Normal rate, normal heart sounds and intact distal pulses.   No murmur heard. Irregular rhythm  Pulmonary/Chest: Effort normal and breath sounds normal. No respiratory distress.  Abdominal: Soft. There is no tenderness. There is no rebound and no guarding.  Genitourinary: Guaiac positive stool.  Erythema and skin breakdown along the gluteal  cleft and anus. Small external hemorrhoid. No fecal impaction. Apparent stenosis of anal opening with difficulty inserting finger.  Musculoskeletal: Normal range of motion. He exhibits no edema or tenderness.  Surgical dressing to L knee C/d/i  Neurological: He is alert and oriented to person, place, and time. No cranial nerve deficit. He exhibits normal muscle tone. Coordination normal.  No ataxia on finger to nose bilaterally. No pronator drift. 5/5 strength throughout. CN 2-12 intact.  Equal grip strength. Sensation intact. Gait not tested  Skin: Skin is warm.  Psychiatric: He has a normal mood and affect. His behavior is normal.  Nursing note and vitals reviewed.   ED Course  Procedures (including critical care time) Labs Review Labs Reviewed  CBC WITH DIFFERENTIAL/PLATELET - Abnormal; Notable for the following:  RBC 2.95 (*)    Hemoglobin 9.2 (*)    HCT 28.7 (*)    All other components within normal limits  COMPREHENSIVE METABOLIC PANEL - Abnormal; Notable for the following:    Potassium 3.2 (*)    Glucose, Bld 133 (*)    Calcium 8.3 (*)    Total Protein 6.2 (*)    Albumin 2.8 (*)    GFR calc non Af Amer 59 (*)    All other components within normal limits  PROTIME-INR - Abnormal; Notable for the following:    Prothrombin Time 24.8 (*)    INR 2.27 (*)    All other components within normal limits  POC OCCULT BLOOD, ED - Abnormal; Notable for the following:    Fecal Occult Bld POSITIVE (*)    All other components within normal limits  I-STAT CG4 LACTIC ACID, ED    Imaging Review Ct Abdomen Pelvis W Contrast  03/31/2015   CLINICAL DATA:  Acute onset of constipation.  Initial encounter.  EXAM: CT ABDOMEN AND PELVIS WITH CONTRAST  TECHNIQUE: Multidetector CT imaging of the abdomen and pelvis was performed using the standard protocol following bolus administration of intravenous contrast.  CONTRAST:  OMNIPAQUE IOHEXOL 300 MG/ML  SOLN  COMPARISON:  None.  FINDINGS: Small  right and trace left pleural effusions are seen. Bibasilar airspace opacities may reflect atelectasis or scarring. Pneumonia is considered less likely. Underlying diffuse pleural calcification is noted at the left lung base. Scattered coronary artery calcifications are seen.  The liver and spleen are unremarkable in appearance. The patient is status post cholecystectomy, with clips noted at the gallbladder fossa. The pancreas and adrenal glands are unremarkable.  A few scattered bilateral renal cysts are seen, with minimal bilateral perinephric stranding. There is no evidence of hydronephrosis. No renal or ureteral stones are identified.  No free fluid is identified. The small bowel is unremarkable in appearance. The stomach is within normal limits. No acute vascular abnormalities are seen. Diffuse calcification is noted along the abdominal aorta and its branches.  The appendix is normal in caliber and contains air, without evidence for appendicitis. Scattered diverticulosis is noted along the distal descending and proximal sigmoid colon, without evidence of diverticulitis.  The bladder is mildly distended. A prominent calcification is noted in the bladder. The prostate is enlarged, measuring 6.1 cm in transverse dimension. No inguinal lymphadenopathy is seen.  No acute osseous abnormalities are identified. The patient's right hip arthroplasty is grossly unremarkable in appearance.  IMPRESSION: 1. No acute abnormality seen within the abdomen or pelvis. 2. Scattered diverticulosis along the distal descending and proximal sigmoid colon, without evidence of diverticulitis. 3. Diffuse calcification along the abdominal aorta and its branches. 4. Small right and trace left pleural effusions. Bibasilar airspace opacities may reflect atelectasis or scarring. Pneumonia is considered less likely. Underlying diffuse pleural calcification at the left lung base. 5. Scattered coronary artery calcifications seen. 6. Few scattered  bilateral renal cysts noted. 7. Enlarged prostate noted. 8. Prominent focal calcification seen within the bladder.   Electronically Signed   By: Roanna Raider M.D.   On: 03/31/2015 06:06   I have personally reviewed and evaluated these images and lab results as part of my medical decision-making.   EKG Interpretation None      MDM   Final diagnoses:  Constipation, unspecified constipation type  Atelectasis   patient from nursing home with constipation and "rectal swelling". He denies abdominal pain or rectal pain. No fever or vomiting.  Hemoglobin stable compared to post operative values. No fecal impaction on exam. INR 2.2. Hemoccult trace positive but no gross blood. No melena.  CT findings discussed with Dr. Juanetta Gosling. Patient with no difficulty breathing, hypoxia or productive cough. Suspect atelectasis rather than pneumonia. He will arrange for patient to have further status probably treatments at nursing home.  Patient comfortable with returning family is agreeable. They feel he is at his baseline. No hypoxia or increased work of breathing.  His abdomen is soft and benign.  There is no evidence of active GI bleed.  HR and BP stable. Hemoglobin stable.  FOBT positive likely from hemorrhoid and trauma from enemas he has been receiving.   He feels at his baseline and wishes to return to nursing home.  Family agrees. D/w Dr. Juanetta Gosling who will arrange followup for patient. Return precautions discussed.    Glynn Octave, MD 04/01/15 1045

## 2015-03-31 NOTE — ED Notes (Signed)
Pt made aware to return if symptoms worsen or if any life threatening symptoms occur.   

## 2015-03-31 NOTE — ED Notes (Signed)
Report given to [facility] nurse, she reports that she will come get pt and bring him back through tunnel in wheelchair.

## 2015-04-11 ENCOUNTER — Encounter (HOSPITAL_COMMUNITY)
Admission: RE | Admit: 2015-04-11 | Discharge: 2015-04-11 | Disposition: A | Discharge status: Home / Self Care | Payer: Medicare Other | Source: Skilled Nursing Facility | Attending: Pulmonary Disease | Admitting: Pulmonary Disease

## 2015-04-11 DIAGNOSIS — I10 Essential (primary) hypertension: Secondary | ICD-10-CM | POA: Insufficient documentation

## 2015-04-11 DIAGNOSIS — I82509 Chronic embolism and thrombosis of unspecified deep veins of unspecified lower extremity: Secondary | ICD-10-CM | POA: Insufficient documentation

## 2015-04-11 DIAGNOSIS — Z7901 Long term (current) use of anticoagulants: Secondary | ICD-10-CM | POA: Insufficient documentation

## 2015-04-11 LAB — PROTIME-INR
INR: 3.18 — AB (ref 0.00–1.49)
PROTHROMBIN TIME: 32 s — AB (ref 11.6–15.2)

## 2015-04-14 ENCOUNTER — Ambulatory Visit (INDEPENDENT_AMBULATORY_CARE_PROVIDER_SITE_OTHER): Payer: Medicare Other | Admitting: *Deleted

## 2015-04-14 ENCOUNTER — Telehealth: Payer: Self-pay | Admitting: *Deleted

## 2015-04-14 ENCOUNTER — Telehealth: Payer: Self-pay | Admitting: Physician Assistant

## 2015-04-14 ENCOUNTER — Encounter (HOSPITAL_COMMUNITY)
Admission: AD | Admit: 2015-04-14 | Discharge: 2015-04-14 | Disposition: A | Discharge status: Home / Self Care | Payer: Medicare Other | Source: Skilled Nursing Facility | Attending: Pulmonary Disease | Admitting: Pulmonary Disease

## 2015-04-14 DIAGNOSIS — I482 Chronic atrial fibrillation, unspecified: Secondary | ICD-10-CM

## 2015-04-14 DIAGNOSIS — Z5181 Encounter for therapeutic drug level monitoring: Secondary | ICD-10-CM

## 2015-04-14 LAB — PROTIME-INR
INR: 3.73 — ABNORMAL HIGH (ref 0.00–1.49)
PROTHROMBIN TIME: 36 s — AB (ref 11.6–15.2)

## 2015-04-14 NOTE — Telephone Encounter (Signed)
See coumadin note. 

## 2015-04-14 NOTE — Telephone Encounter (Signed)
    I [facility] with [facility]. Mr Edward QualiaJone's is there  after a surgery. INR supratheraputic (3.73) and they need coumadin orders. I told her to hold today.  She apparently has been trying to call the [facility] all day and never got an answer. Will alert the appropriate people to have her called back.    Cline CrockKathryn Tarell Schollmeyer PA-C  [facility]

## 2015-04-14 NOTE — Telephone Encounter (Signed)
James [name] at [facility] has PT -INR and she needs coumadin orders for the pt

## 2015-04-15 ENCOUNTER — Ambulatory Visit (INDEPENDENT_AMBULATORY_CARE_PROVIDER_SITE_OTHER): Payer: Medicare Other | Admitting: Cardiology

## 2015-04-15 ENCOUNTER — Encounter (HOSPITAL_COMMUNITY)
Admission: RE | Admit: 2015-04-15 | Discharge: 2015-04-15 | Disposition: A | Discharge status: Home / Self Care | Payer: Medicare Other | Source: Skilled Nursing Facility | Attending: Pulmonary Disease | Admitting: Pulmonary Disease

## 2015-04-15 DIAGNOSIS — I482 Chronic atrial fibrillation, unspecified: Secondary | ICD-10-CM

## 2015-04-15 DIAGNOSIS — Z5181 Encounter for therapeutic drug level monitoring: Secondary | ICD-10-CM

## 2015-04-15 LAB — PROTIME-INR
INR: 3.27 — ABNORMAL HIGH (ref 0.00–1.49)
Prothrombin Time: 32.6 seconds — ABNORMAL HIGH (ref 11.6–15.2)

## 2015-04-19 ENCOUNTER — Other Ambulatory Visit: Payer: Self-pay | Admitting: Cardiovascular Disease

## 2015-04-24 ENCOUNTER — Ambulatory Visit (INDEPENDENT_AMBULATORY_CARE_PROVIDER_SITE_OTHER): Payer: Medicare Other | Admitting: *Deleted

## 2015-04-24 DIAGNOSIS — I482 Chronic atrial fibrillation, unspecified: Secondary | ICD-10-CM

## 2015-04-24 DIAGNOSIS — Z5181 Encounter for therapeutic drug level monitoring: Secondary | ICD-10-CM

## 2015-04-24 LAB — POCT INR: INR: 3.9

## 2015-05-01 ENCOUNTER — Ambulatory Visit (INDEPENDENT_AMBULATORY_CARE_PROVIDER_SITE_OTHER): Payer: Medicare Other | Admitting: *Deleted

## 2015-05-01 DIAGNOSIS — I482 Chronic atrial fibrillation, unspecified: Secondary | ICD-10-CM

## 2015-05-01 DIAGNOSIS — Z5181 Encounter for therapeutic drug level monitoring: Secondary | ICD-10-CM

## 2015-05-01 LAB — POCT INR: INR: 3.6

## 2015-05-08 ENCOUNTER — Ambulatory Visit (INDEPENDENT_AMBULATORY_CARE_PROVIDER_SITE_OTHER): Payer: Medicare Other | Admitting: *Deleted

## 2015-05-08 DIAGNOSIS — I482 Chronic atrial fibrillation, unspecified: Secondary | ICD-10-CM

## 2015-05-08 DIAGNOSIS — Z5181 Encounter for therapeutic drug level monitoring: Secondary | ICD-10-CM

## 2015-05-08 LAB — POCT INR: INR: 3.5

## 2015-05-14 ENCOUNTER — Ambulatory Visit (INDEPENDENT_AMBULATORY_CARE_PROVIDER_SITE_OTHER): Payer: Medicare Other | Admitting: *Deleted

## 2015-05-14 DIAGNOSIS — Z5181 Encounter for therapeutic drug level monitoring: Secondary | ICD-10-CM

## 2015-05-14 DIAGNOSIS — I482 Chronic atrial fibrillation, unspecified: Secondary | ICD-10-CM

## 2015-05-14 LAB — POCT INR: INR: 2.7

## 2015-05-27 ENCOUNTER — Ambulatory Visit (INDEPENDENT_AMBULATORY_CARE_PROVIDER_SITE_OTHER): Payer: Medicare Other | Admitting: *Deleted

## 2015-05-27 DIAGNOSIS — I482 Chronic atrial fibrillation, unspecified: Secondary | ICD-10-CM

## 2015-05-27 DIAGNOSIS — Z5181 Encounter for therapeutic drug level monitoring: Secondary | ICD-10-CM

## 2015-05-27 LAB — POCT INR: INR: 1.6

## 2015-06-02 ENCOUNTER — Ambulatory Visit (INDEPENDENT_AMBULATORY_CARE_PROVIDER_SITE_OTHER): Payer: Medicare Other | Admitting: *Deleted

## 2015-06-02 ENCOUNTER — Telehealth: Payer: Self-pay | Admitting: *Deleted

## 2015-06-02 DIAGNOSIS — I482 Chronic atrial fibrillation, unspecified: Secondary | ICD-10-CM

## 2015-06-02 DIAGNOSIS — Z5181 Encounter for therapeutic drug level monitoring: Secondary | ICD-10-CM

## 2015-06-02 LAB — POCT INR: INR: 2.1

## 2015-06-02 NOTE — Telephone Encounter (Signed)
INR - 2.1 / please call with instructions. / tg  °

## 2015-06-02 NOTE — Telephone Encounter (Signed)
See coumadin note. 

## 2015-06-09 ENCOUNTER — Ambulatory Visit (INDEPENDENT_AMBULATORY_CARE_PROVIDER_SITE_OTHER): Payer: Medicare Other | Admitting: *Deleted

## 2015-06-09 ENCOUNTER — Telehealth: Payer: Self-pay | Admitting: *Deleted

## 2015-06-09 DIAGNOSIS — I482 Chronic atrial fibrillation, unspecified: Secondary | ICD-10-CM

## 2015-06-09 DIAGNOSIS — Z5181 Encounter for therapeutic drug level monitoring: Secondary | ICD-10-CM

## 2015-06-09 LAB — POCT INR: INR: 2.6

## 2015-06-09 NOTE — Telephone Encounter (Signed)
INR - 2.6 / he did not take dose last night by mistake / please call with instructions / tg

## 2015-06-09 NOTE — Telephone Encounter (Signed)
See coumadin note. 

## 2015-06-16 ENCOUNTER — Ambulatory Visit (INDEPENDENT_AMBULATORY_CARE_PROVIDER_SITE_OTHER): Payer: Medicare Other | Admitting: *Deleted

## 2015-06-16 ENCOUNTER — Other Ambulatory Visit (HOSPITAL_COMMUNITY)
Admission: RE | Admit: 2015-06-16 | Discharge: 2015-06-16 | Disposition: A | Discharge status: Home / Self Care | Payer: Medicare Other | Source: Other Acute Inpatient Hospital | Attending: Pulmonary Disease | Admitting: Pulmonary Disease

## 2015-06-16 DIAGNOSIS — I10 Essential (primary) hypertension: Secondary | ICD-10-CM | POA: Insufficient documentation

## 2015-06-16 DIAGNOSIS — Z5181 Encounter for therapeutic drug level monitoring: Secondary | ICD-10-CM

## 2015-06-16 DIAGNOSIS — D649 Anemia, unspecified: Secondary | ICD-10-CM | POA: Insufficient documentation

## 2015-06-16 DIAGNOSIS — I482 Chronic atrial fibrillation, unspecified: Secondary | ICD-10-CM

## 2015-06-16 LAB — CBC WITH DIFFERENTIAL/PLATELET
BASOS PCT: 0 %
Basophils Absolute: 0 10*3/uL (ref 0.0–0.1)
Eosinophils Absolute: 0.2 10*3/uL (ref 0.0–0.7)
Eosinophils Relative: 3 %
HEMATOCRIT: 34.4 % — AB (ref 39.0–52.0)
HEMOGLOBIN: 10.8 g/dL — AB (ref 13.0–17.0)
Lymphocytes Relative: 19 %
Lymphs Abs: 1.6 10*3/uL (ref 0.7–4.0)
MCH: 29.5 pg (ref 26.0–34.0)
MCHC: 31.4 g/dL (ref 30.0–36.0)
MCV: 94 fL (ref 78.0–100.0)
MONO ABS: 1 10*3/uL (ref 0.1–1.0)
MONOS PCT: 11 %
Neutro Abs: 5.8 10*3/uL (ref 1.7–7.7)
Neutrophils Relative %: 67 %
Platelets: 198 10*3/uL (ref 150–400)
RBC: 3.66 MIL/uL — ABNORMAL LOW (ref 4.22–5.81)
RDW: 16.1 % — AB (ref 11.5–15.5)
WBC: 8.6 10*3/uL (ref 4.0–10.5)

## 2015-06-16 LAB — COMPREHENSIVE METABOLIC PANEL
ALBUMIN: 3.1 g/dL — AB (ref 3.5–5.0)
ALK PHOS: 101 U/L (ref 38–126)
ALT: 19 U/L (ref 17–63)
ANION GAP: 6 (ref 5–15)
AST: 25 U/L (ref 15–41)
BILIRUBIN TOTAL: 0.6 mg/dL (ref 0.3–1.2)
BUN: 16 mg/dL (ref 6–20)
CALCIUM: 9 mg/dL (ref 8.9–10.3)
CO2: 30 mmol/L (ref 22–32)
Chloride: 102 mmol/L (ref 101–111)
Creatinine, Ser: 1.3 mg/dL — ABNORMAL HIGH (ref 0.61–1.24)
GFR, EST AFRICAN AMERICAN: 54 mL/min — AB (ref >60)
GFR, EST NON AFRICAN AMERICAN: 46 mL/min — AB (ref >60)
GLUCOSE: 167 mg/dL — AB (ref 65–99)
POTASSIUM: 3.8 mmol/L (ref 3.5–5.1)
Sodium: 138 mmol/L (ref 135–145)
TOTAL PROTEIN: 6.2 g/dL — AB (ref 6.5–8.1)

## 2015-06-16 LAB — POCT INR: INR: 3.1

## 2015-07-07 ENCOUNTER — Ambulatory Visit (INDEPENDENT_AMBULATORY_CARE_PROVIDER_SITE_OTHER): Payer: Medicare Other | Admitting: *Deleted

## 2015-07-07 DIAGNOSIS — I482 Chronic atrial fibrillation, unspecified: Secondary | ICD-10-CM

## 2015-07-07 DIAGNOSIS — Z5181 Encounter for therapeutic drug level monitoring: Secondary | ICD-10-CM

## 2015-07-07 DIAGNOSIS — Z7901 Long term (current) use of anticoagulants: Secondary | ICD-10-CM

## 2015-07-07 LAB — POCT INR: INR: 2.7

## 2015-07-09 ENCOUNTER — Other Ambulatory Visit: Payer: Self-pay | Admitting: Cardiovascular Disease

## 2015-07-28 ENCOUNTER — Ambulatory Visit (INDEPENDENT_AMBULATORY_CARE_PROVIDER_SITE_OTHER): Payer: Medicare Other | Admitting: *Deleted

## 2015-07-28 DIAGNOSIS — Z7901 Long term (current) use of anticoagulants: Secondary | ICD-10-CM

## 2015-07-28 DIAGNOSIS — I482 Chronic atrial fibrillation, unspecified: Secondary | ICD-10-CM

## 2015-07-28 DIAGNOSIS — Z5181 Encounter for therapeutic drug level monitoring: Secondary | ICD-10-CM

## 2015-07-28 LAB — POCT INR: INR: 2.8

## 2015-08-25 ENCOUNTER — Ambulatory Visit (INDEPENDENT_AMBULATORY_CARE_PROVIDER_SITE_OTHER): Payer: Medicare Other | Admitting: *Deleted

## 2015-08-25 DIAGNOSIS — I482 Chronic atrial fibrillation, unspecified: Secondary | ICD-10-CM

## 2015-08-25 DIAGNOSIS — Z5181 Encounter for therapeutic drug level monitoring: Secondary | ICD-10-CM | POA: Diagnosis not present

## 2015-08-25 DIAGNOSIS — Z7901 Long term (current) use of anticoagulants: Secondary | ICD-10-CM | POA: Diagnosis not present

## 2015-08-25 LAB — POCT INR: INR: 1.9

## 2015-09-22 ENCOUNTER — Ambulatory Visit (INDEPENDENT_AMBULATORY_CARE_PROVIDER_SITE_OTHER): Payer: Medicare Other | Admitting: *Deleted

## 2015-09-22 DIAGNOSIS — I482 Chronic atrial fibrillation, unspecified: Secondary | ICD-10-CM

## 2015-09-22 DIAGNOSIS — Z7901 Long term (current) use of anticoagulants: Secondary | ICD-10-CM | POA: Diagnosis not present

## 2015-09-22 DIAGNOSIS — Z5181 Encounter for therapeutic drug level monitoring: Secondary | ICD-10-CM

## 2015-09-22 LAB — POCT INR: INR: 1.9

## 2015-10-20 ENCOUNTER — Ambulatory Visit (INDEPENDENT_AMBULATORY_CARE_PROVIDER_SITE_OTHER): Payer: Medicare Other | Admitting: *Deleted

## 2015-10-20 DIAGNOSIS — I482 Chronic atrial fibrillation, unspecified: Secondary | ICD-10-CM

## 2015-10-20 DIAGNOSIS — Z5181 Encounter for therapeutic drug level monitoring: Secondary | ICD-10-CM

## 2015-10-20 LAB — POCT INR: INR: 3.7

## 2015-11-10 ENCOUNTER — Ambulatory Visit (INDEPENDENT_AMBULATORY_CARE_PROVIDER_SITE_OTHER): Payer: Medicare Other | Admitting: *Deleted

## 2015-11-10 DIAGNOSIS — Z7901 Long term (current) use of anticoagulants: Secondary | ICD-10-CM

## 2015-11-10 DIAGNOSIS — I482 Chronic atrial fibrillation, unspecified: Secondary | ICD-10-CM

## 2015-11-10 DIAGNOSIS — Z5181 Encounter for therapeutic drug level monitoring: Secondary | ICD-10-CM | POA: Diagnosis not present

## 2015-11-10 LAB — POCT INR: INR: 2.5

## 2015-11-23 ENCOUNTER — Encounter (HOSPITAL_COMMUNITY): Payer: Self-pay | Admitting: Emergency Medicine

## 2015-11-23 ENCOUNTER — Emergency Department (HOSPITAL_COMMUNITY)
Admission: EM | Admit: 2015-11-23 | Discharge: 2015-11-23 | Disposition: A | Discharge status: Home / Self Care | Payer: Medicare Other | Attending: Emergency Medicine | Admitting: Emergency Medicine

## 2015-11-23 DIAGNOSIS — I4891 Unspecified atrial fibrillation: Secondary | ICD-10-CM | POA: Insufficient documentation

## 2015-11-23 DIAGNOSIS — M1990 Unspecified osteoarthritis, unspecified site: Secondary | ICD-10-CM | POA: Diagnosis not present

## 2015-11-23 DIAGNOSIS — L989 Disorder of the skin and subcutaneous tissue, unspecified: Secondary | ICD-10-CM

## 2015-11-23 DIAGNOSIS — E785 Hyperlipidemia, unspecified: Secondary | ICD-10-CM | POA: Diagnosis not present

## 2015-11-23 DIAGNOSIS — I10 Essential (primary) hypertension: Secondary | ICD-10-CM | POA: Diagnosis not present

## 2015-11-23 DIAGNOSIS — Z8673 Personal history of transient ischemic attack (TIA), and cerebral infarction without residual deficits: Secondary | ICD-10-CM | POA: Diagnosis not present

## 2015-11-23 DIAGNOSIS — Z79899 Other long term (current) drug therapy: Secondary | ICD-10-CM | POA: Insufficient documentation

## 2015-11-23 DIAGNOSIS — Z4801 Encounter for change or removal of surgical wound dressing: Secondary | ICD-10-CM | POA: Diagnosis present

## 2015-11-23 MED ORDER — BACITRACIN ZINC 500 UNIT/GM EX OINT
TOPICAL_OINTMENT | CUTANEOUS | Status: AC
  Administered 2015-11-23: 21:00:00
  Filled 2015-11-23: qty 0.9

## 2015-11-23 NOTE — ED Notes (Signed)
Pt reports scab on nose, has redness. Unknown cause of wound.  Pt family reports that it looked like it had pus under it yesterday. Pt denies fever/ chills.  Pt also c/o ulcer on heel.

## 2015-11-23 NOTE — ED Notes (Signed)
Physician in to assess 

## 2015-11-23 NOTE — Discharge Instructions (Signed)
Recommend washing the nose skin lesion was soaked in water daily and apply Neosporin ointment to it twice a day. Make an appointment to follow-up with your primary care doctor also recommend following up with dermatology. The nose skin lesion is concerning for skin cancer.

## 2015-11-23 NOTE — ED Notes (Signed)
Pt and wife report that a place on his nose appears and then resolves.This week is has been painful with peak on Weds.. Pt does not go outside per family as he walks with a walker. He also has a place on his heel that his doctor has evaluated and given topical antibiotic ointment for . He has not seen a dermatologist for this place nor has his PCP evaluated it per the family.

## 2015-11-23 NOTE — ED Provider Notes (Signed)
CSN: [id number]     Arrival date & time 11/23/15  1624 History   First MD Initiated Contact with Patient 11/23/15 1737     Chief Complaint  Patient presents with  . Wound Check     (Consider location/radiation/quality/duration/timing/severity/associated sxs/prior Treatment) Patient is a 82 y.o. male presenting with wound check. The history is provided by the patient, the spouse and a relative.  Wound Check Pertinent negatives include no abdominal pain and no shortness of breath.  Patient with a history of several months of a skin lesion on his nose which is getting bigger and then started to bleed. Also they were concerned that there was some pus. Patient also with an area on his right heel is been present for a long period of time is been followed by his primary care doctor. Patient is on Coumadin. The lesion on the nose will scab and then peel and and then re-scab.  Past Medical History  Diagnosis Date  . Atrial fibrillation, chronic (HCC)     Permanent atrial fibrillation  . Anemia     H&H of 12.4/38.5 with MCV of 110 in /2012  . Hypertension     Lab 05/2011: Normal CMet except glucose of 121 and creatinine of 1.37  . TOTAL KNEE REVISIONLeft   . Hyperlipidemia     Lipid profile in 05/2011:138, 120, 34, 80  . Cerebrovascular disease     Left posterior parietal infarction  . Chronic anticoagulation     H&H of 12.4/38.5 in 05/2011; negative stool Hemoccults in 05/2009  . Fasting hyperglycemia     Glucose values of 70-135 in 2010-2012  . Herpes zoster 2011  . Dysrhythmia   . Stroke (HCC)   . Pulmonary embolism (HCC)   . Deep vein thrombosis (DVT) (HCC)   . History of kidney stones   . GERD (gastroesophageal reflux disease)   . Arthritis    Past Surgical History  Procedure Laterality Date  . Renal artery stent    . Hip fracture surgery    . Cholecystectomy    . Appendectomy    . Hemorrhoid surgery    . Joint replacement    . Eye surgery Bilateral   . Colonoscopy    .  Wisdom tooth extraction    . Total knee revision Left 03/18/2015    Procedure: TOTAL KNEE REVISION;  Surgeon: Valeria Batman, MD;  Location: [facility] OR;  Service: Orthopedics;  Laterality: Left;   Family History  Problem Relation Age of Onset  . Cancer Mother    Social History  Substance Use Topics  . Smoking status: Never Smoker   . Smokeless tobacco: Never Used  . Alcohol Use: No    Review of Systems  Constitutional: Negative for fever.  HENT: Negative for congestion.   Eyes: Negative for redness.  Respiratory: Negative for shortness of breath.   Gastrointestinal: Negative for abdominal pain.  Genitourinary: Negative for dysuria.  Musculoskeletal: Negative for back pain.  Skin: Positive for wound.  Hematological: Bruises/bleeds easily.  Psychiatric/Behavioral: Negative for confusion.      Allergies  No known allergies  Home Medications   Prior to Admission medications   Medication Sig Start Date End Date Taking? Authorizing Provider  allopurinol (ZYLOPRIM) 300 MG tablet Take 300 mg by mouth daily with supper.    Yes Historical Provider, MD  furosemide (LASIX) 40 MG tablet TAKE 1/2 TABLET BY MOUTH DAILY. 04/21/15  Yes Laqueta Linden, MD  lisinopril (PRINIVIL,ZESTRIL) 40 MG tablet TAKE ONE TABLET BY  MOUTH DAILY. 01/22/15  Yes Jodelle GrossKathryn M Lawrence, NP  metoprolol (LOPRESSOR) 50 MG tablet TAKE (1) TABLET BY MOUTH TWICE DAILY. 07/09/15  Yes Laqueta LindenSuresh A Koneswaran, MD  Multiple Vitamin (MULTIVITAMIN) tablet Take 1 tablet by mouth daily.     Yes Historical Provider, MD  Multiple Vitamins-Minerals (PRESERVISION AREDS PO) Take 1 tablet by mouth 2 (two) times daily.    Yes Historical Provider, MD  omeprazole (PRILOSEC) 20 MG capsule Take 20 mg by mouth daily.     Yes Historical Provider, MD  polyethylene glycol powder (GLYCOLAX/MIRALAX) powder Take 17 g by mouth at bedtime. 11/06/15  Yes Historical Provider, MD  potassium chloride (K-DUR) 10 MEQ tablet TAKE ONE TABLET BY MOUTH DAILY.  02/20/15  Yes Jodelle GrossKathryn M Lawrence, NP  simvastatin (ZOCOR) 40 MG tablet TAKE (1) TABLET BY MOUTH AT BEDTIME FOR CHOLESTEROL. 01/06/15  Yes Jodelle GrossKathryn M Lawrence, NP  tamsulosin (FLOMAX) 0.4 MG CAPS capsule Take 0.4 mg by mouth daily. 11/10/15  Yes Historical Provider, MD  warfarin (COUMADIN) 2.5 MG tablet Take 1.25-2.5 mg by mouth daily. Take 1.25mg  (one-half tablet) on MWF, then take 2.5mg  (one whole tablet) on all other days as directed per [facility]madin Clinic   Yes Historical Provider, MD   BP 203/98 mmHg  Pulse 71  Temp(Src) 98.3 F (36.8 C) (Oral)  Resp 18  Wt 88.451 kg  SpO2 98% Physical Exam  Constitutional: He is oriented to person, place, and time. He appears well-developed and well-nourished. No distress.  HENT:  Head: Normocephalic and atraumatic.  Mouth/Throat: Oropharynx is clear and moist.  The nose with a 1 cm area of skin lesion with some underlying hemorrhage nor evidence of any secondary infection. No evidence of significant cellulitis.  Eyes: Conjunctivae and EOM are normal. Pupils are equal, round, and reactive to light.  Neck: Normal range of motion. Neck supple.  Cardiovascular: Normal rate, regular rhythm and normal heart sounds.   No murmur heard. Pulmonary/Chest: Effort normal and breath sounds normal. No respiratory distress.  Abdominal: Soft. Bowel sounds are normal. There is no tenderness.  Musculoskeletal: Normal range of motion.  Healed the right foot with some scaly calloused area and a corn.  Neurological: He is alert and oriented to person, place, and time. No cranial nerve deficit. He exhibits normal muscle tone. Coordination normal.  Skin: Skin is warm. No rash noted.  Nursing note and vitals reviewed.   ED Course  Procedures (including critical care time) Labs Review Labs Reviewed - No data to display  Imaging Review No results found. I have personally reviewed and evaluated these images and lab results as part of my medical decision-making.   EKG  Interpretation None      MDM   Final diagnoses:  Skin lesion of face   Lesion on the anterior surface of the nose concerning for a skin cancer. Been present for many months now starting to leak. No evidence of any significant infection. Recommend dermatology follow-up and applying Neosporin ointment to the area in the meantime. Patient's has a scaling part of skin to his heel on the right side with a corn. No evidence secondary infection there.   Vanetta MuldersScott Fanchon Papania, MD 11/23/15 [phone]

## 2015-11-23 NOTE — ED Notes (Signed)
Pt discharged via MD instruct- Son verbalizes understanding of DC instruct, med regime and follow up

## 2015-12-08 ENCOUNTER — Ambulatory Visit (INDEPENDENT_AMBULATORY_CARE_PROVIDER_SITE_OTHER): Payer: Medicare Other | Admitting: *Deleted

## 2015-12-08 DIAGNOSIS — Z5181 Encounter for therapeutic drug level monitoring: Secondary | ICD-10-CM | POA: Diagnosis not present

## 2015-12-08 DIAGNOSIS — I482 Chronic atrial fibrillation, unspecified: Secondary | ICD-10-CM

## 2015-12-08 DIAGNOSIS — Z7901 Long term (current) use of anticoagulants: Secondary | ICD-10-CM | POA: Diagnosis not present

## 2015-12-08 LAB — POCT INR: INR: 2.9

## 2015-12-18 ENCOUNTER — Ambulatory Visit (HOSPITAL_COMMUNITY)
Admission: RE | Admit: 2015-12-18 | Discharge: 2015-12-18 | Disposition: A | Discharge status: Home / Self Care | Payer: Medicare Other | Source: Ambulatory Visit | Attending: Pulmonary Disease | Admitting: Pulmonary Disease

## 2015-12-18 ENCOUNTER — Other Ambulatory Visit (HOSPITAL_COMMUNITY)
Admission: RE | Admit: 2015-12-18 | Discharge: 2015-12-18 | Disposition: A | Discharge status: Home / Self Care | Payer: Medicare Other | Source: Ambulatory Visit | Attending: Pulmonary Disease | Admitting: Pulmonary Disease

## 2015-12-18 ENCOUNTER — Other Ambulatory Visit (HOSPITAL_COMMUNITY): Payer: Self-pay | Admitting: Pulmonary Disease

## 2015-12-18 DIAGNOSIS — K219 Gastro-esophageal reflux disease without esophagitis: Secondary | ICD-10-CM | POA: Insufficient documentation

## 2015-12-18 DIAGNOSIS — R531 Weakness: Secondary | ICD-10-CM | POA: Insufficient documentation

## 2015-12-18 DIAGNOSIS — I4891 Unspecified atrial fibrillation: Secondary | ICD-10-CM | POA: Diagnosis present

## 2015-12-18 DIAGNOSIS — D649 Anemia, unspecified: Secondary | ICD-10-CM | POA: Insufficient documentation

## 2015-12-18 DIAGNOSIS — N181 Chronic kidney disease, stage 1: Secondary | ICD-10-CM | POA: Insufficient documentation

## 2015-12-18 DIAGNOSIS — I700 Atherosclerosis of aorta: Secondary | ICD-10-CM | POA: Diagnosis not present

## 2015-12-18 DIAGNOSIS — E785 Hyperlipidemia, unspecified: Secondary | ICD-10-CM | POA: Diagnosis present

## 2015-12-18 DIAGNOSIS — I517 Cardiomegaly: Secondary | ICD-10-CM | POA: Diagnosis not present

## 2015-12-18 DIAGNOSIS — J449 Chronic obstructive pulmonary disease, unspecified: Secondary | ICD-10-CM | POA: Diagnosis not present

## 2015-12-18 DIAGNOSIS — R0602 Shortness of breath: Secondary | ICD-10-CM | POA: Diagnosis present

## 2015-12-18 LAB — COMPREHENSIVE METABOLIC PANEL
ALK PHOS: 99 U/L (ref 38–126)
ALT: 62 U/L (ref 17–63)
AST: 81 U/L — ABNORMAL HIGH (ref 15–41)
Albumin: 3 g/dL — ABNORMAL LOW (ref 3.5–5.0)
Anion gap: 7 (ref 5–15)
BILIRUBIN TOTAL: 1.2 mg/dL (ref 0.3–1.2)
BUN: 24 mg/dL — ABNORMAL HIGH (ref 6–20)
CO2: 25 mmol/L (ref 22–32)
CREATININE: 1.31 mg/dL — AB (ref 0.61–1.24)
Calcium: 8.6 mg/dL — ABNORMAL LOW (ref 8.9–10.3)
Chloride: 102 mmol/L (ref 101–111)
GFR calc non Af Amer: 46 mL/min — ABNORMAL LOW (ref >60)
GFR, EST AFRICAN AMERICAN: 53 mL/min — AB (ref >60)
Glucose, Bld: 213 mg/dL — ABNORMAL HIGH (ref 65–99)
Potassium: 3.7 mmol/L (ref 3.5–5.1)
SODIUM: 134 mmol/L — AB (ref 135–145)
Total Protein: 6.6 g/dL (ref 6.5–8.1)

## 2015-12-18 LAB — CBC
HEMATOCRIT: 36.7 % — AB (ref 39.0–52.0)
HEMOGLOBIN: 12.1 g/dL — AB (ref 13.0–17.0)
MCH: 31.4 pg (ref 26.0–34.0)
MCHC: 33 g/dL (ref 30.0–36.0)
MCV: 95.3 fL (ref 78.0–100.0)
Platelets: 165 10*3/uL (ref 150–400)
RBC: 3.85 MIL/uL — AB (ref 4.22–5.81)
RDW: 15 % (ref 11.5–15.5)
WBC: 10 10*3/uL (ref 4.0–10.5)

## 2015-12-31 ENCOUNTER — Other Ambulatory Visit: Payer: Self-pay | Admitting: Adult Health

## 2016-01-12 ENCOUNTER — Ambulatory Visit (INDEPENDENT_AMBULATORY_CARE_PROVIDER_SITE_OTHER): Payer: Medicare Other | Admitting: *Deleted

## 2016-01-12 DIAGNOSIS — Z7901 Long term (current) use of anticoagulants: Secondary | ICD-10-CM | POA: Diagnosis not present

## 2016-01-12 DIAGNOSIS — I482 Chronic atrial fibrillation, unspecified: Secondary | ICD-10-CM

## 2016-01-12 DIAGNOSIS — Z5181 Encounter for therapeutic drug level monitoring: Secondary | ICD-10-CM | POA: Diagnosis not present

## 2016-01-12 LAB — POCT INR: INR: 4.5

## 2016-01-26 ENCOUNTER — Ambulatory Visit (INDEPENDENT_AMBULATORY_CARE_PROVIDER_SITE_OTHER): Payer: Medicare Other | Admitting: *Deleted

## 2016-01-26 DIAGNOSIS — I482 Chronic atrial fibrillation, unspecified: Secondary | ICD-10-CM

## 2016-01-26 DIAGNOSIS — Z5181 Encounter for therapeutic drug level monitoring: Secondary | ICD-10-CM | POA: Diagnosis not present

## 2016-01-26 DIAGNOSIS — Z7901 Long term (current) use of anticoagulants: Secondary | ICD-10-CM | POA: Diagnosis not present

## 2016-01-26 LAB — POCT INR: INR: 2.7

## 2016-01-28 ENCOUNTER — Other Ambulatory Visit: Payer: Self-pay | Admitting: Adult Health

## 2016-02-11 ENCOUNTER — Ambulatory Visit: Payer: Medicare Other | Admitting: Cardiovascular Disease

## 2016-02-11 ENCOUNTER — Ambulatory Visit (INDEPENDENT_AMBULATORY_CARE_PROVIDER_SITE_OTHER): Payer: Medicare Other | Admitting: *Deleted

## 2016-02-11 DIAGNOSIS — Z7901 Long term (current) use of anticoagulants: Secondary | ICD-10-CM | POA: Diagnosis not present

## 2016-02-11 DIAGNOSIS — I482 Chronic atrial fibrillation, unspecified: Secondary | ICD-10-CM

## 2016-02-11 DIAGNOSIS — Z5181 Encounter for therapeutic drug level monitoring: Secondary | ICD-10-CM

## 2016-02-11 LAB — POCT INR: INR: 2.4

## 2016-02-25 ENCOUNTER — Encounter: Payer: Self-pay | Admitting: Physician Assistant

## 2016-02-25 ENCOUNTER — Other Ambulatory Visit: Payer: Self-pay | Admitting: Cardiovascular Disease

## 2016-02-25 ENCOUNTER — Ambulatory Visit (INDEPENDENT_AMBULATORY_CARE_PROVIDER_SITE_OTHER): Payer: Medicare Other | Admitting: Physician Assistant

## 2016-02-25 VITALS — BP 160/78 | HR 70 | Ht 70.0 in | Wt 187.0 lb

## 2016-02-25 DIAGNOSIS — R011 Cardiac murmur, unspecified: Secondary | ICD-10-CM | POA: Insufficient documentation

## 2016-02-25 DIAGNOSIS — I4891 Unspecified atrial fibrillation: Secondary | ICD-10-CM | POA: Diagnosis not present

## 2016-02-25 DIAGNOSIS — I10 Essential (primary) hypertension: Secondary | ICD-10-CM

## 2016-02-25 DIAGNOSIS — E785 Hyperlipidemia, unspecified: Secondary | ICD-10-CM | POA: Diagnosis not present

## 2016-02-25 NOTE — Patient Instructions (Signed)
Your physician wants you to follow-up in: 1 Year with Dr. Koneswaran.  You will receive a reminder letter in the mail two months in advance. If you don't receive a letter, please call our office to schedule the follow-up appointment.  Your physician recommends that you continue on your current medications as directed. Please refer to the Current Medication list given to you today.  If you need a refill on your cardiac medications before your next appointment, please call your pharmacy.  Thank you for choosing [facility]!   

## 2016-02-25 NOTE — Progress Notes (Signed)
Cardiology Office Note    Date:  02/25/2016   ID:  [name], [name] 1921/10/06, MRN [id number]  PCP:  Fredirick Maudlin, MD  Cardiologist: Dr. Purvis Sheffield  Chief Complaint  Patient presents with  . Follow-up    History of Present Illness:  James [name] is a 82 y.o. male patient with history of chronic atrial fibrillation, hypertension and prior CVA managed with metoprolol and warfarin. Last seen in our office 01/2015 for preoperative clearance before undergoing left total knee replacement. 2-D echo in 2012 demonstrated normal left ventricular systolic function LVEF 60-65% with moderate biatrial enlargement and moderately elevated pulmonary pressures, 49 mmHg. Patient had surgery without cardiac complications.  Patient is here today for one-year follow-up.He is accompanied by his wife. He denies any cardiac complaints. No chest pain, palpitations, dyspnea, dyspnea on exertion, dizziness, or presyncope. He is very sedentary and doesn't do much. He is happy sit in a chair in the front porch most of the day. His wife has to assist him getting up and down, taking a shower and getting in and out of the car.    Past Medical History:  Diagnosis Date  . Anemia    H&H of 12.4/38.5 with MCV of 110 in /2012  . Arthritis   . Atrial fibrillation, chronic (HCC)    Permanent atrial fibrillation  . Cerebrovascular disease    Left posterior parietal infarction  . Chronic anticoagulation    H&H of 12.4/38.5 in 05/2011; negative stool Hemoccults in 05/2009  . Deep vein thrombosis (DVT) (HCC)   . Dysrhythmia   . Fasting hyperglycemia    Glucose values of 70-135 in 2010-2012  . GERD (gastroesophageal reflux disease)   . Herpes zoster 2011  . History of kidney stones   . Hyperlipidemia    Lipid profile in 05/2011:138, 120, 34, 80  . Hypertension    Lab 05/2011: Normal CMet except glucose of 121 and creatinine of 1.37  . Pulmonary embolism (HCC)   . Stroke (HCC)   . TOTAL KNEE REVISIONLeft      Past Surgical History:  Procedure Laterality Date  . APPENDECTOMY    . CHOLECYSTECTOMY    . COLONOSCOPY    . EYE SURGERY Bilateral   . HEMORRHOID SURGERY    . HIP FRACTURE SURGERY    . JOINT REPLACEMENT    . RENAL ARTERY STENT    . TOTAL KNEE REVISION Left 03/18/2015   Procedure: TOTAL KNEE REVISION;  Surgeon: Valeria Batman, MD;  Location: [facility] OR;  Service: Orthopedics;  Laterality: Left;  . WISDOM TOOTH EXTRACTION      Current Medications: Outpatient Medications Prior to Visit  Medication Sig Dispense Refill  . allopurinol (ZYLOPRIM) 300 MG tablet Take 300 mg by mouth daily with supper.     . furosemide (LASIX) 40 MG tablet TAKE 1/2 TABLET BY MOUTH DAILY. 45 tablet 2  . lisinopril (PRINIVIL,ZESTRIL) 40 MG tablet TAKE ONE TABLET BY MOUTH DAILY. 30 tablet 0  . metoprolol (LOPRESSOR) 50 MG tablet TAKE (1) TABLET BY MOUTH TWICE DAILY. 60 tablet 11  . Multiple Vitamin (MULTIVITAMIN) tablet Take 1 tablet by mouth daily.      . Multiple Vitamins-Minerals (PRESERVISION AREDS PO) Take 1 tablet by mouth 2 (two) times daily.     Marland Kitchen omeprazole (PRILOSEC) 20 MG capsule Take 20 mg by mouth daily.      . polyethylene glycol powder (GLYCOLAX/MIRALAX) powder Take 17 g by mouth at bedtime.  0  . potassium chloride (K-DUR) 10  MEQ tablet TAKE ONE TABLET BY MOUTH DAILY. 30 tablet 11  . simvastatin (ZOCOR) 40 MG tablet TAKE (1) TABLET BY MOUTH AT BEDTIME FOR CHOLESTEROL. 30 tablet 6  . tamsulosin (FLOMAX) 0.4 MG CAPS capsule Take 0.4 mg by mouth daily.  11  . warfarin (COUMADIN) 2.5 MG tablet Take 1.25-2.5 mg by mouth daily. Take 1.25mg  (one-half tablet) on MWF, then take 2.5mg  (one whole tablet) on all other days as directed per [facility]     No facility-administered medications prior to visit.      Allergies:   No known allergies   Social History   Social History  . Marital status: Married    Spouse name: N/A  . Number of children: N/A  . Years of education: N/A   Social History  Main Topics  . Smoking status: Never Smoker  . Smokeless tobacco: Never Used  . Alcohol use No  . Drug use: No  . Sexual activity: No   Other Topics Concern  . None   Social History Narrative  . None     Family History:  The patient's family history includes Cancer in his mother.   ROS:   Please see the history of present illness.    Review of Systems  Constitution: Positive for decreased appetite.  HENT: Positive for hearing loss.   Cardiovascular: Negative.   Respiratory: Negative.   Endocrine: Negative.   Hematologic/Lymphatic: Negative.   Musculoskeletal: Positive for arthritis, muscle weakness and myalgias.  Gastrointestinal: Negative.   Genitourinary: Negative.   Neurological: Positive for loss of balance.   All other systems reviewed and are negative.   PHYSICAL EXAM:   VS:  BP (!) 160/78   Pulse 70   Ht 5\' 10"  (1.778 m)   Wt 187 lb (84.8 kg)   SpO2 99%   BMI 26.83 kg/m   Physical Exam  GEN: Well nourished, well developed, in no acute distress  Neck: no JVD, carotid bruits, or masses Cardiac:RRR; 3/6 systolic murmur at the apex, no rubs, or gallops  Respiratory:  clear to auscultation bilaterally, normal work of breathing GI: soft, nontender, nondistended, + BS Ext: without cyanosis, clubbing, or edema, Good distal pulses bilaterally MS: no deformity or atrophy  Skin: warm and dry, no rash Psych: euthymic mood, full affect  Wt Readings from Last 3 Encounters:  02/25/16 187 lb (84.8 kg)  11/23/15 195 lb (88.5 kg)  03/18/15 201 lb (91.2 kg)      Studies/Labs Reviewed:   EKG:  EKG is  ordered today.  The ekg ordered today demonstrates Atrial fibrillation with controlled ventricular rate  Recent Labs: 12/18/2015: ALT 62; BUN 24; Creatinine, Ser 1.31; Hemoglobin 12.1; Platelets 165; Potassium 3.7; Sodium 134   Lipid Panel    Component Value Date/Time   CHOL 138 05/31/2011 1325   TRIG 120 05/31/2011 1325   HDL 34 (L) 05/31/2011 1325   CHOLHDL 4.1  05/31/2011 1325   VLDL 24 05/31/2011 1325   LDLCALC 80 05/31/2011 1325    Additional studies/ records that were reviewed today include:  2-D echo 2012 Study Conclusions  - Left ventricle: The cavity size was normal. There was a   very discrete are of fairly prominent focal basal   hypertrophy of the septum. Systolic function was normal.   The estimated ejection fraction was in the range of 60% to   65%. Wall motion was normal; there were no regional wall   motion abnormalities. - Aortic valve: Trivial regurgitation. Valve area:  1.84cm^2(VTI). Valve area: 1.75cm^2 (Vmax). - Mitral valve: Calcified annulus. - Left atrium: The atrium was moderately dilated. - Right atrium: The atrium was moderately dilated. - Atrial septum: No defect or patent foramen ovale was   identified. - Pulmonary arteries: Systolic pressure was mildly   increased. PA peak pressure: 49mm Hg (S). Transthoracic echocardiography.  M-mode, complete 2D, spectral Doppler, and color Doppler.  Height:  Height: 167.6cm. Height: 66in.  Weight:  Weight: 91.2kg. Weight: 200.6lb.  Body mass index:  BMI: 32.4kg/m^2.  Body surface area:    BSA: 703m^2.  Patient status:  Outpatient.  Location:  Echo laboratory.     ASSESSMENT:    1. Atrial fibrillation, unspecified type (HCC)   2. Essential hypertension   3. Hyperlipidemia   4. Systolic murmur      PLAN:  In order of problems listed above: Chronic atrial fibrillation with controlled ventricular rate on metoprolol and Coumadin. Continue current treatment  Essential hypertension blood pressure low on the high side today. Reluctant to increase any of his medications in a 82 year old that is very sedentary.  Hyperlipidemia on simvastatin followed by Dr. Juanetta GoslingHawkins  Systolic murmur consistent with mitral regurgitation. Patient is asymptomatic. We'll not order echo or any further testing     Medication Adjustments/Labs and Tests Ordered: Current medicines are  reviewed at length with the patient today.  Concerns regarding medicines are outlined above.  Medication changes, Labs and Tests ordered today are listed in the Patient Instructions below. Patient Instructions  Your physician wants you to follow-up in: 1 Year with Dr. Purvis SheffieldKoneswaran. You will receive a reminder letter in the mail two months in advance. If you don't receive a letter, please call our office to schedule the follow-up appointment.  Your physician recommends that you continue on your current medications as directed. Please refer to the Current Medication list given to you today.  If you need a refill on your cardiac medications before your next appointment, please call your pharmacy.  Thank you for choosing [facility]!      Signed, James ReedyMichele Lenze, PA-C  02/25/2016 2:09 PM    [facility] [address], [place], [place]  [address] Phone: [phone]; Fax: [phone]

## 2016-02-25 NOTE — Telephone Encounter (Signed)
lisinopril (PRINIVIL,ZESTRIL) 40 MG tablet  Medication  Date: 01/28/2016 Department: [facility] Ordering/Authorizing: Laqueta LindenSuresh A Koneswaran, MD  Order Providers   Prescribing Provider Encounter Provider  Laqueta LindenSuresh A Koneswaran, MD Jodelle GrossKathryn M Lawrence, NP  Medication Detail    Disp Refills Start End   lisinopril (PRINIVIL,ZESTRIL) 40 MG tablet 30 tablet 0 01/28/2016    Sig: TAKE ONE TABLET BY MOUTH DAILY.   Notes to Pharmacy: Pt needs appt for further refills   E-Prescribing Status: Receipt confirmed by pharmacy (01/28/2016 1:08 PM EDT)   Pharmacy   [facility] - [place], [place] - [address]

## 2016-03-10 ENCOUNTER — Other Ambulatory Visit: Payer: Self-pay | Admitting: Adult Health

## 2016-03-17 ENCOUNTER — Ambulatory Visit (INDEPENDENT_AMBULATORY_CARE_PROVIDER_SITE_OTHER): Payer: Medicare Other | Admitting: *Deleted

## 2016-03-17 DIAGNOSIS — Z7901 Long term (current) use of anticoagulants: Secondary | ICD-10-CM

## 2016-03-17 DIAGNOSIS — Z5181 Encounter for therapeutic drug level monitoring: Secondary | ICD-10-CM | POA: Diagnosis not present

## 2016-03-17 DIAGNOSIS — I482 Chronic atrial fibrillation, unspecified: Secondary | ICD-10-CM

## 2016-03-17 LAB — POCT INR: INR: 1.9

## 2016-03-22 ENCOUNTER — Other Ambulatory Visit: Payer: Self-pay | Admitting: Cardiovascular Disease

## 2016-03-22 ENCOUNTER — Other Ambulatory Visit: Payer: Self-pay

## 2016-03-22 DIAGNOSIS — R000 Tachycardia, unspecified: Secondary | ICD-10-CM

## 2016-04-14 ENCOUNTER — Ambulatory Visit (INDEPENDENT_AMBULATORY_CARE_PROVIDER_SITE_OTHER): Payer: Medicare Other | Admitting: *Deleted

## 2016-04-14 DIAGNOSIS — Z7901 Long term (current) use of anticoagulants: Secondary | ICD-10-CM | POA: Diagnosis not present

## 2016-04-14 DIAGNOSIS — Z5181 Encounter for therapeutic drug level monitoring: Secondary | ICD-10-CM

## 2016-04-14 DIAGNOSIS — I482 Chronic atrial fibrillation, unspecified: Secondary | ICD-10-CM

## 2016-04-14 LAB — POCT INR: INR: 2

## 2016-04-27 ENCOUNTER — Other Ambulatory Visit: Payer: Self-pay | Admitting: Cardiovascular Disease

## 2016-05-12 ENCOUNTER — Ambulatory Visit (INDEPENDENT_AMBULATORY_CARE_PROVIDER_SITE_OTHER): Payer: Medicare Other | Admitting: *Deleted

## 2016-05-12 DIAGNOSIS — Z7901 Long term (current) use of anticoagulants: Secondary | ICD-10-CM | POA: Diagnosis not present

## 2016-05-12 DIAGNOSIS — I482 Chronic atrial fibrillation, unspecified: Secondary | ICD-10-CM

## 2016-05-12 DIAGNOSIS — Z5181 Encounter for therapeutic drug level monitoring: Secondary | ICD-10-CM

## 2016-05-12 LAB — POCT INR: INR: 2.3

## 2016-06-23 ENCOUNTER — Ambulatory Visit (INDEPENDENT_AMBULATORY_CARE_PROVIDER_SITE_OTHER): Payer: Medicare Other | Admitting: *Deleted

## 2016-06-23 DIAGNOSIS — Z7901 Long term (current) use of anticoagulants: Secondary | ICD-10-CM

## 2016-06-23 DIAGNOSIS — Z5181 Encounter for therapeutic drug level monitoring: Secondary | ICD-10-CM | POA: Diagnosis not present

## 2016-06-23 DIAGNOSIS — I482 Chronic atrial fibrillation, unspecified: Secondary | ICD-10-CM

## 2016-06-23 LAB — POCT INR: INR: 2.6

## 2016-07-22 ENCOUNTER — Other Ambulatory Visit (HOSPITAL_COMMUNITY): Payer: Self-pay | Admitting: Pulmonary Disease

## 2016-07-22 ENCOUNTER — Ambulatory Visit (HOSPITAL_COMMUNITY)
Admission: RE | Admit: 2016-07-22 | Discharge: 2016-07-22 | Disposition: A | Discharge status: Home / Self Care | Payer: Medicare Other | Source: Ambulatory Visit | Attending: Pulmonary Disease | Admitting: Pulmonary Disease

## 2016-07-22 DIAGNOSIS — R4702 Dysphasia: Secondary | ICD-10-CM

## 2016-07-22 DIAGNOSIS — R1310 Dysphagia, unspecified: Secondary | ICD-10-CM | POA: Diagnosis not present

## 2016-07-22 DIAGNOSIS — K224 Dyskinesia of esophagus: Secondary | ICD-10-CM | POA: Diagnosis not present

## 2016-07-22 DIAGNOSIS — I700 Atherosclerosis of aorta: Secondary | ICD-10-CM | POA: Diagnosis not present

## 2016-07-26 ENCOUNTER — Other Ambulatory Visit (INDEPENDENT_AMBULATORY_CARE_PROVIDER_SITE_OTHER): Payer: Self-pay | Admitting: Internal Medicine

## 2016-07-26 DIAGNOSIS — R1310 Dysphagia, unspecified: Secondary | ICD-10-CM | POA: Insufficient documentation

## 2016-07-27 ENCOUNTER — Other Ambulatory Visit: Payer: Self-pay | Admitting: Cardiovascular Disease

## 2016-07-27 ENCOUNTER — Other Ambulatory Visit: Payer: Self-pay | Admitting: Adult Health

## 2016-07-28 ENCOUNTER — Telehealth: Payer: Self-pay | Admitting: *Deleted

## 2016-07-28 NOTE — Telephone Encounter (Signed)
Pt is off coumadin for an EGD on 07/30/16 by Dr Karilyn Cotaehman.  He will restart coumadin that night.  Had INR appt for 08/04/16.  INR appt moved to 08/09/16.

## 2016-07-28 NOTE — Telephone Encounter (Signed)
Patient's wife states that she has a question for Masco CorporationLisa Reid. / tg

## 2016-07-30 ENCOUNTER — Ambulatory Visit (HOSPITAL_COMMUNITY)
Admission: RE | Admit: 2016-07-30 | Discharge: 2016-07-30 | Disposition: A | Discharge status: Home / Self Care | Payer: Medicare Other | Source: Ambulatory Visit | Attending: Internal Medicine | Admitting: Internal Medicine

## 2016-07-30 ENCOUNTER — Encounter (HOSPITAL_COMMUNITY): Payer: Self-pay | Admitting: *Deleted

## 2016-07-30 ENCOUNTER — Encounter (HOSPITAL_COMMUNITY)
Admission: RE | Disposition: A | Discharge status: Home / Self Care | Payer: Self-pay | Source: Ambulatory Visit | Attending: Internal Medicine

## 2016-07-30 DIAGNOSIS — Z8673 Personal history of transient ischemic attack (TIA), and cerebral infarction without residual deficits: Secondary | ICD-10-CM | POA: Diagnosis not present

## 2016-07-30 DIAGNOSIS — I482 Chronic atrial fibrillation: Secondary | ICD-10-CM | POA: Diagnosis not present

## 2016-07-30 DIAGNOSIS — R1314 Dysphagia, pharyngoesophageal phase: Secondary | ICD-10-CM | POA: Diagnosis present

## 2016-07-30 DIAGNOSIS — Z7901 Long term (current) use of anticoagulants: Secondary | ICD-10-CM | POA: Diagnosis not present

## 2016-07-30 DIAGNOSIS — K222 Esophageal obstruction: Secondary | ICD-10-CM | POA: Diagnosis not present

## 2016-07-30 DIAGNOSIS — Z96652 Presence of left artificial knee joint: Secondary | ICD-10-CM | POA: Insufficient documentation

## 2016-07-30 DIAGNOSIS — E785 Hyperlipidemia, unspecified: Secondary | ICD-10-CM | POA: Diagnosis not present

## 2016-07-30 DIAGNOSIS — K219 Gastro-esophageal reflux disease without esophagitis: Secondary | ICD-10-CM | POA: Diagnosis not present

## 2016-07-30 DIAGNOSIS — R933 Abnormal findings on diagnostic imaging of other parts of digestive tract: Secondary | ICD-10-CM | POA: Insufficient documentation

## 2016-07-30 DIAGNOSIS — Z86718 Personal history of other venous thrombosis and embolism: Secondary | ICD-10-CM | POA: Insufficient documentation

## 2016-07-30 DIAGNOSIS — Z79899 Other long term (current) drug therapy: Secondary | ICD-10-CM | POA: Diagnosis not present

## 2016-07-30 DIAGNOSIS — K449 Diaphragmatic hernia without obstruction or gangrene: Secondary | ICD-10-CM | POA: Diagnosis not present

## 2016-07-30 DIAGNOSIS — K3189 Other diseases of stomach and duodenum: Secondary | ICD-10-CM | POA: Insufficient documentation

## 2016-07-30 DIAGNOSIS — I10 Essential (primary) hypertension: Secondary | ICD-10-CM | POA: Insufficient documentation

## 2016-07-30 DIAGNOSIS — R1310 Dysphagia, unspecified: Secondary | ICD-10-CM | POA: Insufficient documentation

## 2016-07-30 HISTORY — PX: ESOPHAGEAL DILATION: SHX303

## 2016-07-30 HISTORY — PX: ESOPHAGOGASTRODUODENOSCOPY: SHX5428

## 2016-07-30 SURGERY — EGD (ESOPHAGOGASTRODUODENOSCOPY)
Anesthesia: Moderate Sedation

## 2016-07-30 MED ORDER — FENTANYL CITRATE (PF) 100 MCG/2ML IJ SOLN
INTRAMUSCULAR | Status: DC | PRN
  Administered 2016-07-30: 25 ug via INTRAVENOUS

## 2016-07-30 MED ORDER — MEPERIDINE HCL 50 MG/ML IJ SOLN
INTRAMUSCULAR | Status: AC
  Filled 2016-07-30: qty 1

## 2016-07-30 MED ORDER — FENTANYL CITRATE (PF) 100 MCG/2ML IJ SOLN
INTRAMUSCULAR | Status: AC
  Filled 2016-07-30: qty 2

## 2016-07-30 MED ORDER — ENALAPRILAT 1.25 MG/ML IV SOLN
INTRAVENOUS | Status: AC
  Filled 2016-07-30: qty 2

## 2016-07-30 MED ORDER — MIDAZOLAM HCL 5 MG/5ML IJ SOLN
INTRAMUSCULAR | Status: DC | PRN
  Administered 2016-07-30 (×3): 1 mg via INTRAVENOUS

## 2016-07-30 MED ORDER — SODIUM CHLORIDE 0.9 % IV SOLN
INTRAVENOUS | Status: DC
  Administered 2016-07-30: 07:00:00 via INTRAVENOUS

## 2016-07-30 MED ORDER — ENALAPRILAT 1.25 MG/ML IV SOLN
INTRAVENOUS | Status: DC | PRN
  Administered 2016-07-30: 1.25 mg via INTRAVENOUS

## 2016-07-30 MED ORDER — BUTAMBEN-TETRACAINE-BENZOCAINE 2-2-14 % EX AERO
INHALATION_SPRAY | CUTANEOUS | Status: DC | PRN
  Administered 2016-07-30: 2 via TOPICAL

## 2016-07-30 MED ORDER — MIDAZOLAM HCL 5 MG/5ML IJ SOLN
INTRAMUSCULAR | Status: AC
  Filled 2016-07-30: qty 10

## 2016-07-30 MED ORDER — STERILE WATER FOR IRRIGATION IR SOLN
Status: DC | PRN
  Administered 2016-07-30: 08:00:00

## 2016-07-30 NOTE — Op Note (Signed)
[facility] Patient Name: James [name] Procedure Date: 07/30/2016 7:08 AM MRN: [id number] Date of Birth: 07/02/1921 Attending MD: Lionel December , MD CSN: [id number] Age: 83 Admit Type: Outpatient Procedure:                Upper GI endoscopy Indications:              Esophageal dysphagia, Abnormal abdominal x-ray of                            the GI tract Providers:                Lionel December, MD, Loma Messing B. Patsy Lager, RN, Dyann Ruddle Referring MD:             Oneal Deputy. Juanetta Gosling, MD Medicines:                enalapril 1.25 mg IV                           , Cetacaine spray, Fentanyl 25 micrograms IV,                            Midazolam 3 mg IV Complications:            No immediate complications. Estimated Blood Loss:     Estimated blood loss was minimal. Procedure:                Pre-Anesthesia Assessment:                           - Prior to the procedure, a History and Physical                            was performed, and patient medications and                            allergies were reviewed. The patient's tolerance of                            previous anesthesia was also reviewed. The risks                            and benefits of the procedure and the sedation                            options and risks were discussed with the patient.                            All questions were answered, and informed consent                            was obtained. Prior Anticoagulants: The patient  last took Coumadin (warfarin) 6 days prior to the                            procedure. ASA Grade Assessment: III - A patient                            with severe systemic disease. After reviewing the                            risks and benefits, the patient was deemed in                            satisfactory condition to undergo the procedure.                           After obtaining informed consent, the endoscope was                       passed under direct vision. Throughout the                            procedure, the patient's blood pressure, pulse, and                            oxygen saturations were monitored continuously. The                            [id number] ([id number]) scope was introduced through the                            mouth, and advanced to the second part of duodenum.                            The upper GI endoscopy was accomplished without                            difficulty. The patient tolerated the procedure                            well. Scope In: 7:51:53 AM Scope Out: 7:59:56 AM Total Procedure Duration: 0 hours 8 minutes 3 seconds  Findings:      The examined esophagus was normal.      One moderate benign-appearing, intrinsic stenosis was found 40 cm from       the incisors. This measured 1.2 cm (inner diameter) x less than one cm       (in length) and was traversed. The scope was withdrawn. Dilation was       performed with a Maloney dilator with no resistance at 54 Fr. The       dilation site was examined mucosal disruption ed and showed moderate       improvement in luminal narrowing.      A 3 cm hiatal hernia was present.      Extrinsic compression on the stomach was found in the gastric body.      The exam of the  stomach was otherwise normal.      The duodenal bulb and second portion of the duodenum were normal. Impression:               - Normal esophagus.                           - Benign-appearing esophageal stenosis. Dilated.                           - 3 cm hiatal hernia.                           - Extrinsic compression in the gastric bodyeither                            due to colon or liver.                           - Normal duodenal bulb and second portion of the                            duodenum.                           - No specimens collected. Moderate Sedation:      Moderate (conscious) sedation was administered by the endoscopy nurse        and supervised by the endoscopist. The following parameters were       monitored: oxygen saturation, heart rate, blood pressure, CO2       capnography and response to care. Total physician intraservice time was       16 minutes. Recommendation:           - Patient has a contact number available for                            emergencies. The signs and symptoms of potential                            delayed complications were discussed with the                            patient. Return to normal activities tomorrow.                            Written discharge instructions were provided to the                            patient.                           - Patient has a contact number available for                            emergencies. The signs and symptoms of potential  delayed complications were discussed with the                            patient. Return to normal activities tomorrow.                            Written discharge instructions were provided to the                            patient.                           - Resume previous diet today.                           - Continue present medications.                           - Resume Coumadin (warfarin) at prior dose today.                            Refer to Cou[facility]in Clinic for further adjustment of                            therapy. Procedure Code(s):        --- Professional ---                           [id number], Esophagogastroduodenoscopy, flexible,                            transoral; diagnostic, including collection of                            specimen(s) by brushing or washing, when performed                            (separate procedure)                           43450, Dilation of esophagus, by unguided sound or                            bougie, single or multiple passes                           99152, Moderate sedation services provided by the                            same  physician or other qualified health care                            professional performing the diagnostic or                            therapeutic service that the sedation supports,  requiring the presence of an independent trained                            observer to assist in the monitoring of the                            patient's level of consciousness and physiological                            status; initial 15 minutes of intraservice time,                            patient age 40 years or older Diagnosis Code(s):        --- Professional ---                           K22.2, Esophageal obstruction                           K44.9, Diaphragmatic hernia without obstruction or                            gangrene                           K31.89, Other diseases of stomach and duodenum                           R13.14, Dysphagia, pharyngoesophageal phase                           R93.3, Abnormal findings on diagnostic imaging of                            other parts of digestive tract CPT copyright 2016 American Medical Association. All rights reserved. The codes documented in this report are preliminary and upon coder review may  be revised to meet current compliance requirements. Lionel December, MD Lionel December, MD 07/30/2016 8:09:56 AM This report has been signed electronically. Number of Addenda: 0

## 2016-07-30 NOTE — Discharge Instructions (Signed)
Resume warfarin at usual dose starting this evening. INR checked in 7-10 days. Resume medications and diet as before. No driving for 24 hours.  Esophagogastroduodenoscopy, Care After Introduction Refer to this sheet in the next few weeks. These instructions provide you with information about caring for yourself after your procedure. Your health care provider may also give you more specific instructions. Your treatment has been planned according to current medical practices, but problems sometimes occur. Call your health care provider if you have any problems or questions after your procedure. What can I expect after the procedure? After the procedure, it is common to have:  A sore throat.  Nausea.  Bloating.  Dizziness.  Fatigue. Follow these instructions at home:  Do not eat or drink anything until the numbing medicine (local anesthetic) has worn off and your gag reflex has returned. You will know that the local anesthetic has worn off when you can swallow comfortably.  Do not drive for 24 hours if you received a medicine to help you relax (sedative).  If your health care provider took a tissue sample for testing during the procedure, make sure to get your test results. This is your responsibility. Ask your health care provider or the department performing the test when your results will be ready.  Keep all follow-up visits as told by your health care provider. This is important. Contact a health care provider if:  You cannot stop coughing.  You are not urinating.  You are urinating less than usual. Get help right away if:  You have trouble swallowing.  You cannot eat or drink.  You have throat or chest pain that gets worse.  You are dizzy or light-headed.  You faint.  You have nausea or vomiting.  You have chills.  You have a fever.  You have severe abdominal pain.  You have black, tarry, or bloody stools. This information is not intended to replace advice  given to you by your health care provider. Make sure you discuss any questions you have with your health care provider. Document Released: 05/31/2012 Document Revised: 11/20/2015 Document Reviewed: 05/08/2015  2017 Elsevier

## 2016-07-30 NOTE — Progress Notes (Signed)
Reviewed vital signs and patients progress with Dr. Karilyn Cotaehman.  Ok for discharge per doctor.

## 2016-07-30 NOTE — H&P (Signed)
James [name] is an 83 y.o. male.   Chief Complaint: Patient is here for EGD and ED. HPI: Patient is 83 year old Caucasian male with multiple medical problems including chronic GERD who presents with solid food dysphagia a few weeks duration. He was seen by Dr. Juanetta GoslingHawkins and underwent barium pill esophagogram which suggests esophageal motility disorder anatomy at GE junction preventing passage of barium pill distally. States heartburn is well controlled with therapy. He has not lost any weight. He has been off warfarin for 6 days. Last EGD/ED was over 15 years ago.  Past Medical History:  Diagnosis Date  . Anemia    H&H of 12.4/38.5 with MCV of 110 in /2012  . Arthritis   . Atrial fibrillation, chronic (HCC)    Permanent atrial fibrillation  . Cerebrovascular disease    Left posterior parietal infarction  . Chronic anticoagulation    H&H of 12.4/38.5 in 05/2011; negative stool Hemoccults in 05/2009  . Deep vein thrombosis (DVT) (HCC)   . Dysrhythmia   . Fasting hyperglycemia    Glucose values of 70-135 in 2010-2012  . GERD (gastroesophageal reflux disease)   . Herpes zoster 2011  . History of kidney stones   . Hyperlipidemia    Lipid profile in 05/2011:138, 120, 34, 80  . Hypertension    Lab 05/2011: Normal CMet except glucose of 121 and creatinine of 1.37  . Pulmonary embolism (HCC)   . Stroke (HCC)   . TOTAL KNEE REVISIONLeft     Past Surgical History:  Procedure Laterality Date  . APPENDECTOMY    . CHOLECYSTECTOMY    . COLONOSCOPY    . EYE SURGERY Bilateral   . HEMORRHOID SURGERY    . HIP FRACTURE SURGERY    . JOINT REPLACEMENT    . RENAL ARTERY STENT    . TOTAL KNEE REVISION Left 03/18/2015   Procedure: TOTAL KNEE REVISION;  Surgeon: Valeria BatmanPeter W Whitfield, MD;  [facility];  Service: Orthopedics;  Laterality: Left;  . WISDOM TOOTH EXTRACTION      Family History  Problem Relation Age of Onset  . Cancer Mother    Social History:  reports that he has never smoked.  He has never used smokeless tobacco. He reports that he does not drink alcohol or use drugs.  Allergies:  Allergies  Allergen Reactions  . No Known Allergies     Medications Prior to Admission  Medication Sig Dispense Refill  . allopurinol (ZYLOPRIM) 300 MG tablet Take 300 mg by mouth daily with supper.     . furosemide (LASIX) 40 MG tablet TAKE 1/2 TABLET BY MOUTH DAILY. 45 tablet 2  . lisinopril (PRINIVIL,ZESTRIL) 40 MG tablet TAKE ONE TABLET BY MOUTH DAILY. 30 tablet 11  . metoprolol (LOPRESSOR) 50 MG tablet TAKE (1) TABLET BY MOUTH TWICE DAILY. 60 tablet 11  . Multiple Vitamin (MULTIVITAMIN) tablet Take 1 tablet by mouth daily.      . Multiple Vitamins-Minerals (PRESERVISION AREDS PO) Take 1 tablet by mouth 2 (two) times daily.     Marland Kitchen. omeprazole (PRILOSEC) 20 MG capsule Take 20 mg by mouth daily.      . polyethylene glycol powder (GLYCOLAX/MIRALAX) powder Take 17 g by mouth at bedtime.  0  . potassium chloride (K-DUR) 10 MEQ tablet TAKE ONE TABLET BY MOUTH DAILY. 30 tablet 11  . simvastatin (ZOCOR) 40 MG tablet TAKE (1) TABLET BY MOUTH AT BEDTIME FOR CHOLESTEROL. 30 tablet 11  . tamsulosin (FLOMAX) 0.4 MG CAPS capsule Take 0.4 mg by  mouth daily.  11  . warfarin (COUMADIN) 2.5 MG tablet Take 1.25-2.5 mg by mouth daily. Take 1.25mg  (one-half tablet) on MWF, then take 2.5mg  (one whole tablet) on all other days as directed per [facility]      No results found for this or any previous visit (from the past 48 hour(s)). No results found.  ROS  Blood pressure (!) 220/117, pulse 80, temperature 98.7 F (37.1 C), temperature source Oral, resp. rate (!) 21, height 5\' 4"  (1.626 m), weight 185 lb (83.9 kg), SpO2 100 %. Physical Exam  Constitutional: He appears well-developed and well-nourished.  Patient has hearing impairment  HENT:  Mouth/Throat: Oropharynx is clear and moist.  Eyes: Conjunctivae are normal. No scleral icterus.  Neck: No thyromegaly present.  Cardiovascular:   Irregular rhythm normal S1 and S2. No murmur or gallop noted.  Respiratory: Effort normal and breath sounds normal.  GI: Soft. He exhibits no distension and no mass. There is no tenderness.  Musculoskeletal: Edema: 1+ pitting edema involving both legs.  Lymphadenopathy:    He has no cervical adenopathy.  Neurological: He is alert.  Skin: Skin is warm and dry.     Assessment/Plan Solid food dysphagia. Abnormal barium pill study. EGD with ED.  Lionel December, MD 07/30/2016, 7:38 AM

## 2016-08-06 ENCOUNTER — Encounter (HOSPITAL_COMMUNITY): Payer: Self-pay | Admitting: Internal Medicine

## 2016-08-09 ENCOUNTER — Ambulatory Visit (INDEPENDENT_AMBULATORY_CARE_PROVIDER_SITE_OTHER): Payer: Medicare Other | Admitting: *Deleted

## 2016-08-09 DIAGNOSIS — I482 Chronic atrial fibrillation, unspecified: Secondary | ICD-10-CM

## 2016-08-09 DIAGNOSIS — Z7901 Long term (current) use of anticoagulants: Secondary | ICD-10-CM | POA: Diagnosis not present

## 2016-08-09 DIAGNOSIS — Z5181 Encounter for therapeutic drug level monitoring: Secondary | ICD-10-CM

## 2016-08-09 LAB — POCT INR: INR: 1.5

## 2016-08-30 ENCOUNTER — Ambulatory Visit (INDEPENDENT_AMBULATORY_CARE_PROVIDER_SITE_OTHER): Payer: Medicare Other | Admitting: *Deleted

## 2016-08-30 DIAGNOSIS — I482 Chronic atrial fibrillation, unspecified: Secondary | ICD-10-CM

## 2016-08-30 DIAGNOSIS — Z5181 Encounter for therapeutic drug level monitoring: Secondary | ICD-10-CM | POA: Diagnosis not present

## 2016-08-30 DIAGNOSIS — Z7901 Long term (current) use of anticoagulants: Secondary | ICD-10-CM

## 2016-08-30 LAB — POCT INR: INR: 2

## 2016-09-27 ENCOUNTER — Other Ambulatory Visit: Payer: Self-pay | Admitting: Cardiovascular Disease

## 2016-09-27 ENCOUNTER — Ambulatory Visit (INDEPENDENT_AMBULATORY_CARE_PROVIDER_SITE_OTHER): Payer: Medicare Other | Admitting: *Deleted

## 2016-09-27 DIAGNOSIS — Z7901 Long term (current) use of anticoagulants: Secondary | ICD-10-CM | POA: Diagnosis not present

## 2016-09-27 DIAGNOSIS — Z5181 Encounter for therapeutic drug level monitoring: Secondary | ICD-10-CM

## 2016-09-27 DIAGNOSIS — I482 Chronic atrial fibrillation, unspecified: Secondary | ICD-10-CM

## 2016-09-27 LAB — POCT INR: INR: 5.3

## 2016-10-06 ENCOUNTER — Ambulatory Visit (INDEPENDENT_AMBULATORY_CARE_PROVIDER_SITE_OTHER): Payer: Medicare Other | Admitting: *Deleted

## 2016-10-06 DIAGNOSIS — I482 Chronic atrial fibrillation, unspecified: Secondary | ICD-10-CM

## 2016-10-06 DIAGNOSIS — Z7901 Long term (current) use of anticoagulants: Secondary | ICD-10-CM

## 2016-10-06 DIAGNOSIS — Z5181 Encounter for therapeutic drug level monitoring: Secondary | ICD-10-CM

## 2016-10-06 LAB — POCT INR: INR: 2.2

## 2016-10-27 ENCOUNTER — Ambulatory Visit (INDEPENDENT_AMBULATORY_CARE_PROVIDER_SITE_OTHER): Payer: Medicare Other | Admitting: *Deleted

## 2016-10-27 DIAGNOSIS — I482 Chronic atrial fibrillation, unspecified: Secondary | ICD-10-CM

## 2016-10-27 DIAGNOSIS — Z5181 Encounter for therapeutic drug level monitoring: Secondary | ICD-10-CM

## 2016-10-27 DIAGNOSIS — Z7901 Long term (current) use of anticoagulants: Secondary | ICD-10-CM | POA: Diagnosis not present

## 2016-10-27 LAB — POCT INR: INR: 2.8

## 2016-11-01 ENCOUNTER — Inpatient Hospital Stay (HOSPITAL_COMMUNITY)
Admission: EM | Admit: 2016-11-01 | Discharge: 2016-11-03 | DRG: 535 | Disposition: A | Discharge status: Skilled Nursing Facility | Payer: Medicare Other | Attending: Pulmonary Disease | Admitting: Pulmonary Disease

## 2016-11-01 ENCOUNTER — Encounter (HOSPITAL_COMMUNITY): Payer: Self-pay | Admitting: Emergency Medicine

## 2016-11-01 ENCOUNTER — Emergency Department (HOSPITAL_COMMUNITY): Payer: Medicare Other

## 2016-11-01 DIAGNOSIS — S72009A Fracture of unspecified part of neck of unspecified femur, initial encounter for closed fracture: Secondary | ICD-10-CM

## 2016-11-01 DIAGNOSIS — N183 Chronic kidney disease, stage 3 unspecified: Secondary | ICD-10-CM | POA: Diagnosis present

## 2016-11-01 DIAGNOSIS — Z8673 Personal history of transient ischemic attack (TIA), and cerebral infarction without residual deficits: Secondary | ICD-10-CM

## 2016-11-01 DIAGNOSIS — J9601 Acute respiratory failure with hypoxia: Secondary | ICD-10-CM | POA: Diagnosis present

## 2016-11-01 DIAGNOSIS — K219 Gastro-esophageal reflux disease without esophagitis: Secondary | ICD-10-CM | POA: Diagnosis present

## 2016-11-01 DIAGNOSIS — Z86718 Personal history of other venous thrombosis and embolism: Secondary | ICD-10-CM

## 2016-11-01 DIAGNOSIS — I129 Hypertensive chronic kidney disease with stage 1 through stage 4 chronic kidney disease, or unspecified chronic kidney disease: Secondary | ICD-10-CM | POA: Diagnosis present

## 2016-11-01 DIAGNOSIS — Z96652 Presence of left artificial knee joint: Secondary | ICD-10-CM | POA: Diagnosis present

## 2016-11-01 DIAGNOSIS — F0390 Unspecified dementia without behavioral disturbance: Secondary | ICD-10-CM | POA: Diagnosis present

## 2016-11-01 DIAGNOSIS — Z86711 Personal history of pulmonary embolism: Secondary | ICD-10-CM

## 2016-11-01 DIAGNOSIS — S32810A Multiple fractures of pelvis with stable disruption of pelvic ring, initial encounter for closed fracture: Secondary | ICD-10-CM | POA: Diagnosis not present

## 2016-11-01 DIAGNOSIS — I10 Essential (primary) hypertension: Secondary | ICD-10-CM | POA: Diagnosis not present

## 2016-11-01 DIAGNOSIS — S329XXA Fracture of unspecified parts of lumbosacral spine and pelvis, initial encounter for closed fracture: Secondary | ICD-10-CM | POA: Diagnosis present

## 2016-11-01 DIAGNOSIS — I482 Chronic atrial fibrillation, unspecified: Secondary | ICD-10-CM | POA: Diagnosis present

## 2016-11-01 DIAGNOSIS — E785 Hyperlipidemia, unspecified: Secondary | ICD-10-CM | POA: Diagnosis not present

## 2016-11-01 DIAGNOSIS — M1990 Unspecified osteoarthritis, unspecified site: Secondary | ICD-10-CM | POA: Diagnosis present

## 2016-11-01 DIAGNOSIS — L8990 Pressure ulcer of unspecified site, unspecified stage: Secondary | ICD-10-CM | POA: Diagnosis present

## 2016-11-01 DIAGNOSIS — J189 Pneumonia, unspecified organism: Secondary | ICD-10-CM | POA: Diagnosis present

## 2016-11-01 DIAGNOSIS — J449 Chronic obstructive pulmonary disease, unspecified: Secondary | ICD-10-CM | POA: Diagnosis present

## 2016-11-01 DIAGNOSIS — W19XXXA Unspecified fall, initial encounter: Secondary | ICD-10-CM | POA: Diagnosis present

## 2016-11-01 DIAGNOSIS — Z9049 Acquired absence of other specified parts of digestive tract: Secondary | ICD-10-CM

## 2016-11-01 DIAGNOSIS — J440 Chronic obstructive pulmonary disease with acute lower respiratory infection: Secondary | ICD-10-CM | POA: Diagnosis present

## 2016-11-01 DIAGNOSIS — S51012A Laceration without foreign body of left elbow, initial encounter: Secondary | ICD-10-CM | POA: Diagnosis present

## 2016-11-01 DIAGNOSIS — Z79899 Other long term (current) drug therapy: Secondary | ICD-10-CM

## 2016-11-01 DIAGNOSIS — M109 Gout, unspecified: Secondary | ICD-10-CM | POA: Diagnosis present

## 2016-11-01 DIAGNOSIS — Z96641 Presence of right artificial hip joint: Secondary | ICD-10-CM | POA: Diagnosis present

## 2016-11-01 DIAGNOSIS — Z7901 Long term (current) use of anticoagulants: Secondary | ICD-10-CM

## 2016-11-01 LAB — COMPREHENSIVE METABOLIC PANEL
ALBUMIN: 3.2 g/dL — AB (ref 3.5–5.0)
ALT: 24 U/L (ref 17–63)
AST: 40 U/L (ref 15–41)
Alkaline Phosphatase: 124 U/L (ref 38–126)
Anion gap: 10 (ref 5–15)
BILIRUBIN TOTAL: 1.9 mg/dL — AB (ref 0.3–1.2)
BUN: 15 mg/dL (ref 6–20)
CHLORIDE: 102 mmol/L (ref 101–111)
CO2: 26 mmol/L (ref 22–32)
Calcium: 9 mg/dL (ref 8.9–10.3)
Creatinine, Ser: 1.16 mg/dL (ref 0.61–1.24)
GFR, EST NON AFRICAN AMERICAN: 53 mL/min — AB (ref >60)
Glucose, Bld: 136 mg/dL — ABNORMAL HIGH (ref 65–99)
POTASSIUM: 3.4 mmol/L — AB (ref 3.5–5.1)
SODIUM: 138 mmol/L (ref 135–145)
TOTAL PROTEIN: 6.6 g/dL (ref 6.5–8.1)

## 2016-11-01 LAB — URINALYSIS, ROUTINE W REFLEX MICROSCOPIC
Bacteria, UA: NONE SEEN
Bilirubin Urine: NEGATIVE
GLUCOSE, UA: NEGATIVE mg/dL
HGB URINE DIPSTICK: NEGATIVE
Ketones, ur: 5 mg/dL — AB
Leukocytes, UA: NEGATIVE
NITRITE: NEGATIVE
PH: 7 (ref 5.0–8.0)
PROTEIN: 30 mg/dL — AB
Specific Gravity, Urine: 1.013 (ref 1.005–1.030)

## 2016-11-01 LAB — CBC WITH DIFFERENTIAL/PLATELET
BASOS PCT: 0 %
Basophils Absolute: 0 10*3/uL (ref 0.0–0.1)
EOS ABS: 0.1 10*3/uL (ref 0.0–0.7)
EOS PCT: 1 %
HCT: 38.1 % — ABNORMAL LOW (ref 39.0–52.0)
HEMOGLOBIN: 12.5 g/dL — AB (ref 13.0–17.0)
Lymphocytes Relative: 9 %
Lymphs Abs: 0.9 10*3/uL (ref 0.7–4.0)
MCH: 31.4 pg (ref 26.0–34.0)
MCHC: 32.8 g/dL (ref 30.0–36.0)
MCV: 95.7 fL (ref 78.0–100.0)
MONOS PCT: 10 %
Monocytes Absolute: 1.1 10*3/uL — ABNORMAL HIGH (ref 0.1–1.0)
NEUTROS PCT: 80 %
Neutro Abs: 8.4 10*3/uL — ABNORMAL HIGH (ref 1.7–7.7)
PLATELETS: 118 10*3/uL — AB (ref 150–400)
RBC: 3.98 MIL/uL — AB (ref 4.22–5.81)
RDW: 15.3 % (ref 11.5–15.5)
WBC: 10.5 10*3/uL (ref 4.0–10.5)

## 2016-11-01 LAB — PROTIME-INR
INR: 2.88
Prothrombin Time: 30.8 seconds — ABNORMAL HIGH (ref 11.4–15.2)

## 2016-11-01 MED ORDER — ONDANSETRON HCL 4 MG/2ML IJ SOLN: 4.0000 mg | Freq: Four times a day (QID) | INTRAMUSCULAR | Status: DC | PRN

## 2016-11-01 MED ORDER — ALLOPURINOL 300 MG PO TABS
300.0000 mg | ORAL_TABLET | Freq: Every day | ORAL | Status: DC
  Administered 2016-11-01 – 2016-11-02 (×2): 300 mg via ORAL
  Filled 2016-11-01 (×2): qty 1

## 2016-11-01 MED ORDER — SIMVASTATIN 20 MG PO TABS
40.0000 mg | ORAL_TABLET | Freq: Every day | ORAL | Status: DC
  Administered 2016-11-01 – 2016-11-02 (×2): 40 mg via ORAL
  Filled 2016-11-01 (×2): qty 2

## 2016-11-01 MED ORDER — ACETAMINOPHEN 325 MG PO TABS: 650.0000 mg | ORAL_TABLET | Freq: Four times a day (QID) | ORAL | Status: DC | PRN

## 2016-11-01 MED ORDER — POTASSIUM CHLORIDE CRYS ER 10 MEQ PO TBCR
10.0000 meq | EXTENDED_RELEASE_TABLET | Freq: Every day | ORAL | Status: DC
  Administered 2016-11-01 – 2016-11-03 (×3): 10 meq via ORAL
  Filled 2016-11-01 (×3): qty 1

## 2016-11-01 MED ORDER — TAMSULOSIN HCL 0.4 MG PO CAPS
0.4000 mg | ORAL_CAPSULE | Freq: Every day | ORAL | Status: DC
  Administered 2016-11-01 – 2016-11-03 (×3): 0.4 mg via ORAL
  Filled 2016-11-01 (×3): qty 1

## 2016-11-01 MED ORDER — METOPROLOL TARTRATE 50 MG PO TABS
50.0000 mg | ORAL_TABLET | Freq: Two times a day (BID) | ORAL | Status: DC
  Administered 2016-11-01 – 2016-11-03 (×4): 50 mg via ORAL
  Filled 2016-11-01 (×5): qty 1

## 2016-11-01 MED ORDER — WARFARIN SODIUM 2.5 MG PO TABS
1.2500 mg | ORAL_TABLET | Freq: Once | ORAL | Status: AC
  Administered 2016-11-01: 1.25 mg via ORAL
  Filled 2016-11-01: qty 1

## 2016-11-01 MED ORDER — METOPROLOL TARTRATE 50 MG PO TABS
50.0000 mg | ORAL_TABLET | Freq: Two times a day (BID) | ORAL | Status: DC
  Administered 2016-11-01: 50 mg via ORAL
  Filled 2016-11-01: qty 1

## 2016-11-01 MED ORDER — SODIUM CHLORIDE 0.9% FLUSH
3.0000 mL | Freq: Two times a day (BID) | INTRAVENOUS | Status: DC
  Administered 2016-11-01 – 2016-11-03 (×5): 3 mL via INTRAVENOUS

## 2016-11-01 MED ORDER — MORPHINE SULFATE (PF) 4 MG/ML IV SOLN
4.0000 mg | Freq: Once | INTRAVENOUS | Status: AC
  Administered 2016-11-01: 4 mg via INTRAVENOUS
  Filled 2016-11-01: qty 1

## 2016-11-01 MED ORDER — FUROSEMIDE 20 MG PO TABS
20.0000 mg | ORAL_TABLET | Freq: Every day | ORAL | Status: DC
  Administered 2016-11-01 – 2016-11-03 (×3): 20 mg via ORAL
  Filled 2016-11-01 (×3): qty 1

## 2016-11-01 MED ORDER — ACETAMINOPHEN 650 MG RE SUPP: 650.0000 mg | Freq: Four times a day (QID) | RECTAL | Status: DC | PRN

## 2016-11-01 MED ORDER — LISINOPRIL 10 MG PO TABS
40.0000 mg | ORAL_TABLET | Freq: Every day | ORAL | Status: DC
  Administered 2016-11-01: 40 mg via ORAL
  Filled 2016-11-01: qty 4

## 2016-11-01 MED ORDER — ONDANSETRON HCL 4 MG PO TABS: 4.0000 mg | ORAL_TABLET | Freq: Four times a day (QID) | ORAL | Status: DC | PRN

## 2016-11-01 MED ORDER — PANTOPRAZOLE SODIUM 40 MG PO TBEC
40.0000 mg | DELAYED_RELEASE_TABLET | Freq: Every day | ORAL | Status: DC
  Administered 2016-11-01 – 2016-11-03 (×3): 40 mg via ORAL
  Filled 2016-11-01 (×3): qty 1

## 2016-11-01 MED ORDER — BACITRACIN ZINC 500 UNIT/GM EX OINT
1.0000 "application " | TOPICAL_OINTMENT | Freq: Two times a day (BID) | CUTANEOUS | Status: DC
  Administered 2016-11-01 – 2016-11-03 (×5): 1 via TOPICAL
  Filled 2016-11-01 (×4): qty 0.9

## 2016-11-01 MED ORDER — POLYETHYLENE GLYCOL 3350 17 G PO PACK
17.0000 g | PACK | Freq: Every day | ORAL | Status: DC
  Administered 2016-11-01 – 2016-11-02 (×2): 17 g via ORAL
  Filled 2016-11-01 (×2): qty 1

## 2016-11-01 MED ORDER — SODIUM CHLORIDE 0.9 % IV SOLN: 250.0000 mL | INTRAVENOUS | Status: DC | PRN

## 2016-11-01 MED ORDER — WARFARIN - PHARMACIST DOSING INPATIENT
Status: DC
  Administered 2016-11-01: 16:00:00

## 2016-11-01 MED ORDER — SODIUM CHLORIDE 0.9% FLUSH: 3.0000 mL | INTRAVENOUS | Status: DC | PRN

## 2016-11-01 MED ORDER — HYDROCODONE-ACETAMINOPHEN 5-325 MG PO TABS
1.0000 | ORAL_TABLET | ORAL | Status: DC | PRN
  Administered 2016-11-01 (×2): 1 via ORAL
  Administered 2016-11-02: 2 via ORAL
  Administered 2016-11-02 (×2): 1 via ORAL
  Administered 2016-11-03: 2 via ORAL
  Filled 2016-11-01 (×2): qty 2
  Filled 2016-11-01 (×4): qty 1

## 2016-11-01 MED ORDER — WARFARIN SODIUM 2.5 MG PO TABS: 1.2500 mg | ORAL_TABLET | Freq: Every day | ORAL | Status: DC

## 2016-11-01 MED ORDER — LISINOPRIL 10 MG PO TABS
40.0000 mg | ORAL_TABLET | Freq: Every day | ORAL | Status: DC
  Administered 2016-11-02 – 2016-11-03 (×2): 40 mg via ORAL
  Filled 2016-11-01 (×3): qty 4

## 2016-11-01 NOTE — ED Provider Notes (Signed)
[facility]Y DEPT Provider Note   CSN: [id number] Arrival date & time: 11/01/16  0844  By signing my name below, I, Marnette Burgess Long, attest that this documentation has been prepared under the direction and in the presence of Eber Hong, MD. Electronically Signed: Marnette Burgess Long, Scribe. 11/01/2016. 9:52 AM.  History   Chief Complaint Chief Complaint  Patient presents with  . Fall  . Hip Pain   The history is provided by the patient, the spouse and a relative. No language interpreter was used.    HPI Comments:  James [name] is a 83 y.o. male who presents to the Emergency Department complaining of a skin laceration to the left elbow s/p a fall last night. Pt's wife reports last night the pt tried to walk into the bedroom without his walker and walked into the bed, falling backwards and striking his left elbow on the door jam. He reports landing "right on his back" after striking his elbow. Denies LOC or head injury. Pt's wife states he had trouble sleeping all night. Bleeding is mildly controlled at this time. Pt has associated symptoms of persistent, potentially worsening leg swelling beyond baseline and 4/10 left hip pain. Family states he has mild dementia and has trouble hearing. Pt is currently on anticoagulant therapy- Coumadin.  No alleviating home Tx noted. Pt denies any other complaints at this time.   Past Medical History:  Diagnosis Date  . Anemia    H&H of 12.4/38.5 with MCV of 110 in /2012  . Arthritis   . Atrial fibrillation, chronic (HCC)    Permanent atrial fibrillation  . Cerebrovascular disease    Left posterior parietal infarction  . Chronic anticoagulation    H&H of 12.4/38.5 in 05/2011; negative stool Hemoccults in 05/2009  . Deep vein thrombosis (DVT) (HCC)   . Dysrhythmia   . Fasting hyperglycemia    Glucose values of 70-135 in 2010-2012  . GERD (gastroesophageal reflux disease)   . Herpes zoster 2011  . History of kidney stones   . Hyperlipidemia    Lipid profile in 05/2011:138, 120, 34, 80  . Hypertension    Lab 05/2011: Normal CMet except glucose of 121 and creatinine of 1.37  . Pulmonary embolism (HCC)   . Stroke (HCC)   . TOTAL KNEE REVISIONLeft    Patient Active Problem List   Diagnosis Date Noted  . Pelvic fracture (HCC) 11/01/2016  . Dysphagia 07/26/2016  . Systolic murmur 02/25/2016  . 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 S/P total knee replacement using cement 03/18/2015  . Encounter for therapeutic drug monitoring 08/08/2013  . Atrial fibrillation, chronic (HCC)   . Anemia   . Hypertension   . Chronic kidney disease   . Hyperlipidemia   . Cerebrovascular disease   . Chronic anticoagulation   . Fasting hyperglycemia   . HERPES ZOSTER 06/18/2009  . Gout 06/16/2009   Past Surgical History:  Procedure Laterality Date  . APPENDECTOMY    . CHOLECYSTECTOMY    . COLONOSCOPY    . ESOPHAGEAL DILATION N/A 07/30/2016   Procedure: ESOPHAGEAL DILATION;  Surgeon: Malissa Hippo, MD;  Location: [facility];  Service: Endoscopy;  Laterality: N/A;  . ESOPHAGOGASTRODUODENOSCOPY N/A 07/30/2016   Procedure: ESOPHAGOGASTRODUODENOSCOPY (EGD);  Surgeon: Malissa Hippo, MD;  Location: [facility];  Service: Endoscopy;  Laterality: N/A;  730  . EYE SURGERY Bilateral   . HEMORRHOID SURGERY    . HIP FRACTURE SURGERY    . JOINT REPLACEMENT    .  RENAL ARTERY STENT    . TOTAL KNEE REVISION Left 03/18/2015   Procedure: TOTAL KNEE REVISION;  Surgeon: Valeria BatmanPeter W Whitfield, MD;  [facility];  Service: Orthopedics;  Laterality: Left;  . WISDOM TOOTH EXTRACTION      Home Medications    Prior to Admission medications   Medication Sig Start Date End Date Taking? Authorizing Provider  allopurinol (ZYLOPRIM) 300 MG tablet Take 300 mg by mouth daily with supper.     [provider]  furosemide (LASIX) 40 MG tablet TAKE 1/2 TABLET BY MOUTH DAILY. 04/21/15   Laqueta LindenKoneswaran, Suresh A, MD  lisinopril (PRINIVIL,ZESTRIL) 40  MG tablet TAKE ONE TABLET BY MOUTH DAILY. 04/27/16   Laqueta LindenKoneswaran, Suresh A, MD  metoprolol (LOPRESSOR) 50 MG tablet TAKE (1) TABLET BY MOUTH TWICE DAILY. 07/27/16   Laqueta LindenKoneswaran, Suresh A, MD  Multiple Vitamin (MULTIVITAMIN) tablet Take 1 tablet by mouth daily.      [provider]  Multiple Vitamins-Minerals (PRESERVISION AREDS PO) Take 1 tablet by mouth 2 (two) times daily.     [provider]  omeprazole (PRILOSEC) 20 MG capsule Take 20 mg by mouth daily.      [provider]  pantoprazole (PROTONIX) 40 MG tablet Take 40 mg by mouth daily.    [provider]  polyethylene glycol powder (GLYCOLAX/MIRALAX) powder Take 17 g by mouth at bedtime. 11/06/15   [provider]  potassium chloride (K-DUR) 10 MEQ tablet TAKE ONE TABLET BY MOUTH DAILY. 03/10/16   Dyann KiefLenze, Michele M, PA-C  simvastatin (ZOCOR) 40 MG tablet TAKE (1) TABLET BY MOUTH AT BEDTIME FOR CHOLESTEROL. 09/27/16   Laqueta LindenKoneswaran, Suresh A, MD  tamsulosin (FLOMAX) 0.4 MG CAPS capsule Take 0.4 mg by mouth daily. 11/10/15   [provider]  warfarin (COUMADIN) 2.5 MG tablet Take 1.25-2.5 mg by mouth daily. Take 1.25mg  (one-half tablet) on MWF, then take 2.5mg  (one whole tablet) on all other days as directed per [facility]    [provider]    Family History Family History  Problem Relation Age of Onset  . Cancer Mother     Social History Social History  Substance Use Topics  . Smoking status: Never Smoker  . Smokeless tobacco: Never Used  . Alcohol use No     Allergies   No known allergies   Review of Systems Review of Systems  Cardiovascular: Positive for leg swelling.  Musculoskeletal: Positive for arthralgias.  Skin: Positive for wound.  All other systems reviewed and are negative.    Physical Exam Updated Vital Signs BP (!) 229/104 (BP Location: Right Arm)   Pulse 84   Temp 98.2 F (36.8 C) (Oral)   Resp (!) 30   Ht 5\' 6"  (1.676 m)   Wt 195 lb (88.5 kg)    SpO2 93%   BMI 31.47 kg/m   Physical Exam  Constitutional: He is oriented to person, place, and time. He appears well-developed and well-nourished.  HENT:  Head: Normocephalic.  no facial tenderness, deformity, malocclusion or hemotympanum.  no battle's sign or racoon eyes.   Eyes: Conjunctivae are normal.  Cardiovascular: Normal rate.   Bialteral lower extrmeity edema 2+.   Pulmonary/Chest: Effort normal.  Abdominal: He exhibits no distension.  Musculoskeletal: Normal range of motion.  Pain with manipulation of right hip joint. Right leg is about an inch shorter than the left leg. Supple bilateral upper extremities at all joints. Soft compartments diffusely. Lower extremity left leg restricted at the hip secondary to pain.  Neurological: He is alert and oriented to person, place, and time.  Skin: Skin is warm and dry.  Skin tear over his left elbow.  Psychiatric: He has a normal mood and affect.  Nursing note and vitals reviewed.    ED Treatments / Results  DIAGNOSTIC STUDIES:  Oxygen Saturation is 97% on RA, normal by my interpretation.    COORDINATION OF CARE:  8:59 AM Discussed treatment plan with pt at bedside including XR of pelvis, blood work, UA, and pt agreed to plan.  Labs (all labs ordered are listed, but only abnormal results are displayed) Labs Reviewed  CBC WITH DIFFERENTIAL/PLATELET - Abnormal; Notable for the following:       Result Value   RBC 3.98 (*)    Hemoglobin 12.5 (*)    HCT 38.1 (*)    Platelets 118 (*)    Neutro Abs 8.4 (*)    Monocytes Absolute 1.1 (*)    All other components within normal limits  COMPREHENSIVE METABOLIC PANEL - Abnormal; Notable for the following:    Potassium 3.4 (*)    Glucose, Bld 136 (*)    Albumin 3.2 (*)    Total Bilirubin 1.9 (*)    GFR calc non Af Amer 53 (*)    All other components within normal limits  URINALYSIS, ROUTINE W REFLEX MICROSCOPIC    EKG  EKG Interpretation  Date/Time:  Monday Nov 01 2016  09:02:15 EDT Ventricular Rate:  90 PR Interval:    QRS Duration: 155 QT Interval:  440 QTC Calculation: 539 R Axis:   101 Text Interpretation:  Atrial fibrillation Right bundle branch block Since last tracing Right bundle branch block NOW PRESENT Confirmed by Hyacinth Meeker  MD, Chaye Misch ([id number]) on 11/01/2016 10:25:32 AM       Radiology Dg Elbow Complete Left  Result Date: 11/01/2016 CLINICAL DATA:  Fall last night.  Scan laceration on elbow EXAM: LEFT ELBOW - COMPLETE 3+ VIEW COMPARISON:  None. FINDINGS: No visible fracture, subluxation or dislocation. No visible joint effusion although the lateral view is suboptimal due to positioning. Vascular calcifications noted. IMPRESSION: No acute bony abnormality visualized. Electronically Signed   By: Charlett Nose M.D.   On: 11/01/2016 10:10   Dg Hip Unilat W Or Wo Pelvis 2-3 Views Left  Result Date: 11/01/2016 CLINICAL DATA:  83 year old male with left hip pain after falling last night EXAM: DG HIP (WITH OR WITHOUT PELVIS) 2-3V LEFT COMPARISON:  Concurrently obtained radiographs of the left elbow FINDINGS: The bones appear diffusely demineralized. There is a displaced fracture through the left superior pubic ramus and a minimally displaced fracture through the inferior pubic ramus. The left proximal femur remains intact and the humeral head remains located. Surgical changes of prior right total hip arthroplasty without evidence of hardware complication. Exuberant callus present inferior to the lesser trochanter. Atherosclerotic calcifications present in the bilateral common femoral arteries. Multilevel degenerative disc disease visualized in the lumbar spine. The visualized bowel gas pattern is unremarkable. IMPRESSION: 1. Displaced fracture of the left superior pubic ramus. 2. Minimally displaced fracture of the left inferior pubic ramus. 3. Surgical changes of prior right total hip arthroplasty without evidence of hardware complication. 4. The bones appear diffusely  demineralized. 5. Lower lumbar degenerative disc disease. Electronically Signed   By: Malachy Moan M.D.   On: 11/01/2016 10:11    Procedures Procedures (including critical care time)  Medications Ordered in ED Medications  bacitracin ointment 1 application (not administered)  metoprolol (LOPRESSOR) tablet 50 mg (not  administered)  lisinopril (PRINIVIL,ZESTRIL) tablet 40 mg (not administered)  morphine 4 MG/ML injection 4 mg (4 mg Intravenous Given 11/01/16 0944)     Initial Impression / Assessment and Plan / ED Course  I have reviewed the triage vital signs and the nursing notes.  Pertinent labs & imaging results that were available during my care of the patient were reviewed by me and considered in my medical decision making (see chart for details).     Pelvic fractures In afib wtihout complications No active bleeding of elbow wound lkabs reviweed without other acute findigns D/w Dr. Kerry Hough who will admit Appreciate his rapid consult and assistance.  Final Clinical Impressions(s) / ED Diagnoses   Final diagnoses:  Multiple closed fractures of pelvis with stable disruption of pelvic ring, initial encounter (HCC)  Skin tear of left elbow without complication, initial encounter    New Prescriptions New Prescriptions   No medications on file    I personally performed the services described in this documentation, which was scribed in my presence. The recorded information has been reviewed and is accurate.        Eber Hong, MD 11/01/16 1044

## 2016-11-01 NOTE — ED Notes (Signed)
Pt wife states pt lost balance and fell

## 2016-11-01 NOTE — Care Management Obs Status (Signed)
MEDICARE OBSERVATION STATUS NOTIFICATION   Patient Details  Name: James [name] MRN: [id number] Date of Birth: [birth year]   Medicare Observation Status Notification Given:  Yes    Malcolm MetroChildress, Sireen Halk Demske, RN 11/01/2016, 3:59 PM

## 2016-11-01 NOTE — Progress Notes (Signed)
ANTICOAGULATION CONSULT NOTE - Initial Consult  Pharmacy Consult for COUMADIN (home med) Indication: atrial fibrillation  Allergies  Allergen Reactions  . No Known Allergies    Patient Measurements: Height: 5\' 6"  (167.6 cm) Weight: 195 lb (88.5 kg) IBW/kg (Calculated) : 63.8  Vital Signs: Temp: 98.3 F (36.8 C) (05/07 1130) Temp Source: Oral (05/07 1130) BP: 157/74 (05/07 1130) Pulse Rate: 89 (05/07 1130)  Labs:  Recent Labs  11/01/16 0919  HGB 12.5*  HCT 38.1*  PLT 118*  LABPROT 30.8*  INR 2.88  CREATININE 1.16   Estimated Creatinine Clearance: 42.4 mL/min (by C-G formula based on SCr of 1.16 mg/dL).  Medical History: Past Medical History:  Diagnosis Date  . Anemia    H&H of 12.4/38.5 with MCV of 110 in /2012  . Arthritis   . Atrial fibrillation, chronic (HCC)    Permanent atrial fibrillation  . Cerebrovascular disease    Left posterior parietal infarction  . Chronic anticoagulation    H&H of 12.4/38.5 in 05/2011; negative stool Hemoccults in 05/2009  . Deep vein thrombosis (DVT) (HCC)   . Dysrhythmia   . Fasting hyperglycemia    Glucose values of 70-135 in 2010-2012  . GERD (gastroesophageal reflux disease)   . Herpes zoster 2011  . History of kidney stones   . Hyperlipidemia    Lipid profile in 05/2011:138, 120, 34, 80  . Hypertension    Lab 05/2011: Normal CMet except glucose of 121 and creatinine of 1.37  . Pulmonary embolism (HCC)   . Stroke (HCC)   . TOTAL KNEE REVISIONLeft    Medications:  Prescriptions Prior to Admission  Medication Sig Dispense Refill Last Dose  . allopurinol (ZYLOPRIM) 300 MG tablet Take 300 mg by mouth daily with supper.    10/31/2016 at Unknown time  . furosemide (LASIX) 40 MG tablet TAKE 1/2 TABLET BY MOUTH DAILY. 45 tablet 2 10/31/2016 at Unknown time  . lisinopril (PRINIVIL,ZESTRIL) 40 MG tablet TAKE ONE TABLET BY MOUTH DAILY. 30 tablet 11 10/31/2016 at Unknown time  . metoprolol (LOPRESSOR) 50 MG tablet TAKE (1) TABLET BY  MOUTH TWICE DAILY. 60 tablet 11 10/31/2016 at 2100  . Multiple Vitamin (MULTIVITAMIN) tablet Take 1 tablet by mouth daily.     10/31/2016 at Unknown time  . Multiple Vitamins-Minerals (PRESERVISION AREDS PO) Take 1 tablet by mouth 2 (two) times daily.    10/31/2016 at Unknown time  . pantoprazole (PROTONIX) 40 MG tablet Take 40 mg by mouth daily.   10/31/2016 at Unknown time  . polyethylene glycol powder (GLYCOLAX/MIRALAX) powder Take 17 g by mouth at bedtime.  0 10/31/2016 at Unknown time  . potassium chloride (K-DUR) 10 MEQ tablet TAKE ONE TABLET BY MOUTH DAILY. 30 tablet 11 10/31/2016 at Unknown time  . simvastatin (ZOCOR) 40 MG tablet TAKE (1) TABLET BY MOUTH AT BEDTIME FOR CHOLESTEROL. 30 tablet 0 10/31/2016 at Unknown time  . tamsulosin (FLOMAX) 0.4 MG CAPS capsule Take 0.4 mg by mouth daily.  11 10/31/2016 at Unknown time  . warfarin (COUMADIN) 2.5 MG tablet Take 1.25-2.5 mg by mouth daily. Take 1.25mg  (one-half tablet) on MWF, then take 2.5mg  (one whole tablet) on all other days as directed per [facility]   10/31/2016 at Unknown time   Assessment: Pt on Coumadin at home for h/o afib.  INR therapeutic on admission.    Goal of Therapy:  INR 2-3 Monitor platelets by anticoagulation protocol: Yes   Plan:  Coumadin 1.25mg  po today x 1 (home dose) INR daily  Valrie HartHall, Lamon Rotundo A 11/01/2016,1:42 PM

## 2016-11-01 NOTE — ED Notes (Signed)
edp in wit pt 

## 2016-11-01 NOTE — ED Notes (Signed)
Left elbow cleaned. Dressed with wet bandage

## 2016-11-01 NOTE — Evaluation (Signed)
Physical Therapy Evaluation Patient Details Name: James [name] MRN: [id number] DOB: April 11, 1922 Today's Date: 11/01/2016   History of Present Illness  83 y.o. male with medical history significant of hypertension, atrial fibrillation on Coumadin, chronic kidney disease stage III, presents to the hospital today after suffering a fall. Patient was ambulating. He denies any syncope, head trauma, dizziness.  Denies any chest pain, shortness of breath. No recent fevers. No vomiting or diarrhea.  Pt found to have displaced fx of the L superior pubic ramus, and minimally displaced fx of the L inferior pubic ramus.  CXR also shows some possible PNA.     Clinical Impression  Pt received in bed, wife and nephew present, and pt is agreeable to PT evaluation.  Pt requires assistance for ambulation from his wife, as well as use of RW.  She assists him with ADL's such as dressing, and bathing.  During today's PT evaluation, he required Max A for transfer supine<>sit with HOB raised, and he then required Max A +2 for sit<>stand with RW.  However, he was not able to come into full standing position due to posterior lean and poor ability to fully extend his hips due to pain.  Attempted sit<>stand x 2 with RW, and x 1 with STEDY, however, not able to transfer due to continued strong posterior lean and poor hip extension.  Pt was assisted back into the bed.  Due to pt requiring an increased level of care for all functional mobility tasks, high fall risk, and limited assistance available from wife at this time, he is recommended for SNF.    Follow Up Recommendations SNF    Equipment Recommendations  None recommended by PT    Recommendations for Other Services       Precautions / Restrictions Precautions Precautions: Fall Precaution Comments: Total of 3 falls in the past 6 months - one fall off the bed, one other fall in the bathroom.   Restrictions Weight Bearing Restrictions: No      Mobility  Bed  Mobility Overal bed mobility: Needs Assistance Bed Mobility: Supine to Sit     Supine to sit: Max assist;HOB elevated;+2 for physical assistance     General bed mobility comments: Assisted LE's off the EOB, and then required assistance to raise trunk, and use of bed pad to assist hips to the EOB.    Transfers Overall transfer level: Needs assistance Equipment used: Rolling walker (2 wheeled) Transfers: Sit to/from Stand Sit to Stand: Max assist;+2 physical assistance;From elevated surface         General transfer comment: Attempted x 2 trials with RW, however, pt is not able to extend hips to enable him to obtain full upright standing posture.  Attempted STEDY, and still not able to shift hips far enough forward to be able to transfer due to not being able to slide buttocks plates in place.   Ambulation/Gait Ambulation/Gait assistance:  (NA)              Stairs            Wheelchair Mobility    Modified Rankin (Stroke Patients Only)       Balance Overall balance assessment: History of Falls;Needs assistance Sitting-balance support: Feet supported;Bilateral upper extremity supported Sitting balance-Leahy Scale: Fair Sitting balance - Comments: pt demonstrates slight posterior lean, and heavily relies on B UE's for support in sitting.  Pt demonstrates a posterior pelvic tilt with posterior lean -likely to assist with pain reduction with pelvic fx.  Standing balance support: Bilateral upper extremity supported Standing balance-Leahy Scale: Zero Standing balance comment: Strong posterior lean with hip extrensor weakness and inability to shift weight anterior to obtain full upright standing posture.                              Pertinent Vitals/Pain Pain Assessment: No/denies pain (while being still)    Home Living   Living Arrangements: Spouse/significant other Available Help at Discharge: Available PRN/intermittently;Family (two sons and a dtr  check in intermittently when the can.  They work during the day.  ) Type of Home: House Home Access: Ramped entrance     Home Layout: One level Home Equipment: Grab bars - tub/shower;Tub bench;Bedside commode;Walker - 2 wheels;Walker - 4 wheels Additional Comments: wife states they do not have a w/c.     Prior Function     Gait / Transfers Assistance Needed: Wife assisted with gait.  Wife states that she makes sure he has his balance before taking any steps.  Pt uses RW.  Wife assists pt with getting in/out of the bed.   ADL's / Homemaking Assistance Needed: wife assists with dressing, bathing, and shower transfers.         Hand Dominance   Dominant Hand: Right    Extremity/Trunk Assessment   Upper Extremity Assessment Upper Extremity Assessment:  (B UE shoulder flexion to 90*)    Lower Extremity Assessment Lower Extremity Assessment: Generalized weakness;RLE deficits/detail;LLE deficits/detail RLE Deficits / Details: knee flexion 2/5, hip flexion 2/5, ankle DF: 3/5 LLE Deficits / Details: knee flexion 3/5, hip flexion: 2/5, and ankle DF: 3/5    Cervical / Trunk Assessment Cervical / Trunk Assessment: Kyphotic  Communication   Communication: HOH  Cognition Arousal/Alertness: Awake/alert Behavior During Therapy: WFL for tasks assessed/performed Overall Cognitive Status: Within Functional Limits for tasks assessed                                 General Comments: Pt is slow to respond sometimes, and is very HOH.  Wife states that his comprehension has not been the same since the stroke with affected the R side.       General Comments      Exercises     Assessment/Plan    PT Assessment Patient needs continued PT services  PT Problem List Decreased strength;Decreased range of motion;Decreased activity tolerance;Decreased balance;Decreased mobility;Decreased safety awareness;Obesity;Pain;Decreased knowledge of use of DME;Decreased coordination        PT Treatment Interventions DME instruction;Gait training;Functional mobility training;Therapeutic activities;Therapeutic exercise;Balance training;Neuromuscular re-education;Wheelchair mobility training;Patient/family education    PT Goals (Current goals can be found in the Care Plan section)  Acute Rehab PT Goals Patient Stated Goal: Pt wants to get stronger PT Goal Formulation: With patient/family Time For Goal Achievement: 11/15/16 Potential to Achieve Goals: Fair    Frequency Min 5X/week   Barriers to discharge Decreased caregiver support pt lives with his wife, although she has been caring for him, she is no longer able to provide the level of care he will need.     Co-evaluation               AM-PAC PT "6 Clicks" Daily Activity  Outcome Measure Difficulty turning over in bed (including adjusting bedclothes, sheets and blankets)?: A Lot Difficulty moving from lying on back to sitting on the side of the bed? : A Lot  Difficulty sitting down on and standing up from a chair with arms (e.g., wheelchair, bedside commode, etc,.)?: A Lot Help needed moving to and from a bed to chair (including a wheelchair)?: Total Help needed walking in hospital room?: Total Help needed climbing 3-5 steps with a railing? : Total 6 Click Score: 9    End of Session Equipment Utilized During Treatment: Gait belt;Other (comment) (RW and STEDY) Activity Tolerance: Patient limited by pain;Patient limited by fatigue Patient left: in bed;with call bell/phone within reach;with family/visitor present Nurse Communication: Mobility status;Need for lift equipment [employer](Hoyer lift.  Mobility sheet left hanging in the room. ) PT Visit Diagnosis: Unsteadiness on feet (R26.81);Other abnormalities of gait and mobility (R26.89);Muscle weakness (generalized) (M62.81);History of falling (Z91.81);Repeated falls (R29.6);Difficulty in walking, not elsewhere classified (R26.2)    Time: [id number] PT Time Calculation (min)  (ACUTE ONLY): 49 min   Charges:   PT Evaluation $PT Eval Low Complexity: 1 Procedure PT Treatments $Therapeutic Activity: 23-37 mins   PT G Codes:   PT G-Codes **NOT FOR INPATIENT CLASS** Functional Assessment Tool Used: AM-PAC 6 Clicks Basic Mobility;Clinical judgement Functional Limitation: Mobility: Walking and moving around Mobility: Walking and Moving Around Current Status ([id number]([id number]): At least 60 percent but less than 80 percent impaired, limited or restricted Mobility: Walking and Moving Around Goal Status [phone](G8979): At least 40 percent but less than 60 percent impaired, limited or restricted    Beth Shakena Callari, PT, DPT X: [phone]

## 2016-11-01 NOTE — H&P (Signed)
History and Physical    RUSSEL MORAIN WUJ:[id number] DOB: March 04, 1922 DOA: 11/01/2016  PCP: Kari Baars, MD  Patient coming from: home  I have personally briefly reviewed patient's old medical records in [facility]  Chief Complaint: fall  HPI: James [name] is a 83 y.o. male with medical history significant of hypertension, atrial fibrillation on Coumadin, chronic kidney disease stage III, presents to the hospital today after suffering a fall. Patient was ambulating with his walker when he had a fall. He denies any syncope, head trauma, dizziness. He cannot describe the circumstances of fall. He took his wife and son to help lift him up and get into bed. Denies any chest pain, shortness of breath. No recent fevers. No vomiting or diarrhea.  ED Course: In the emergency room, he was noted to be hypertensive likely related to uncontrolled pain. Imaging indicated pelvic fracture. Remainder of lab work was unremarkable. Referred for admission.  Review of Systems: As per HPI otherwise 10 point review of systems negative.    Past Medical History:  Diagnosis Date  . Anemia    H&H of 12.4/38.5 with MCV of 110 in /2012  . Arthritis   . Atrial fibrillation, chronic (HCC)    Permanent atrial fibrillation  . Cerebrovascular disease    Left posterior parietal infarction  . Chronic anticoagulation    H&H of 12.4/38.5 in 05/2011; negative stool Hemoccults in 05/2009  . Deep vein thrombosis (DVT) (HCC)   . Dysrhythmia   . Fasting hyperglycemia    Glucose values of 70-135 in 2010-2012  . GERD (gastroesophageal reflux disease)   . Herpes zoster 2011  . History of kidney stones   . Hyperlipidemia    Lipid profile in 05/2011:138, 120, 34, 80  . Hypertension    Lab 05/2011: Normal CMet except glucose of 121 and creatinine of 1.37  . Pulmonary embolism (HCC)   . Stroke (HCC)   . TOTAL KNEE REVISIONLeft     Past Surgical History:  Procedure Laterality Date  . APPENDECTOMY    .  CHOLECYSTECTOMY    . COLONOSCOPY    . ESOPHAGEAL DILATION N/A 07/30/2016   Procedure: ESOPHAGEAL DILATION;  Surgeon: Malissa Hippo, MD;  [facility];  Service: Endoscopy;  Laterality: N/A;  . ESOPHAGOGASTRODUODENOSCOPY N/A 07/30/2016   Procedure: ESOPHAGOGASTRODUODENOSCOPY (EGD);  Surgeon: Malissa Hippo, MD;  Location: [facility];  Service: Endoscopy;  Laterality: N/A;  730  . EYE SURGERY Bilateral   . HEMORRHOID SURGERY    . HIP FRACTURE SURGERY    . JOINT REPLACEMENT    . RENAL ARTERY STENT    . TOTAL KNEE REVISION Left 03/18/2015   Procedure: TOTAL KNEE REVISION;  Surgeon: Valeria Batman, MD;  Location: [facility];  Service: Orthopedics;  Laterality: Left;  . WISDOM TOOTH EXTRACTION       reports that he has never smoked. He has never used smokeless tobacco. He reports that he does not drink alcohol or use drugs.  Allergies  Allergen Reactions  . No Known Allergies     Family History  Problem Relation Age of Onset  . Cancer Mother     Prior to Admission medications   Medication Sig Start Date End Date Taking? Authorizing Provider  allopurinol (ZYLOPRIM) 300 MG tablet Take 300 mg by mouth daily with supper.    Yes [provider]  furosemide (LASIX) 40 MG tablet TAKE 1/2 TABLET BY MOUTH DAILY. 04/21/15  Yes Laqueta Linden, MD  lisinopril (  PRINIVIL,ZESTRIL) 40 MG tablet TAKE ONE TABLET BY MOUTH DAILY. 04/27/16  Yes Laqueta LindenKoneswaran, Suresh A, MD  metoprolol (LOPRESSOR) 50 MG tablet TAKE (1) TABLET BY MOUTH TWICE DAILY. 07/27/16  Yes Laqueta LindenKoneswaran, Suresh A, MD  Multiple Vitamin (MULTIVITAMIN) tablet Take 1 tablet by mouth daily.     Yes [provider]  Multiple Vitamins-Minerals (PRESERVISION AREDS PO) Take 1 tablet by mouth 2 (two) times daily.    Yes [provider]  pantoprazole (PROTONIX) 40 MG tablet Take 40 mg by mouth daily.   Yes [provider]  polyethylene glycol powder (GLYCOLAX/MIRALAX) powder Take 17 g by mouth at  bedtime. 11/06/15  Yes [provider]  potassium chloride (K-DUR) 10 MEQ tablet TAKE ONE TABLET BY MOUTH DAILY. 03/10/16  Yes Dyann KiefLenze, Michele M, PA-C  simvastatin (ZOCOR) 40 MG tablet TAKE (1) TABLET BY MOUTH AT BEDTIME FOR CHOLESTEROL. 09/27/16  Yes Laqueta LindenKoneswaran, Suresh A, MD  tamsulosin (FLOMAX) 0.4 MG CAPS capsule Take 0.4 mg by mouth daily. 11/10/15  Yes [provider]  warfarin (COUMADIN) 2.5 MG tablet Take 1.25-2.5 mg by mouth daily. Take 1.25mg  (one-half tablet) on MWF, then take 2.5mg  (one whole tablet) on all other days as directed per [facility]   Yes [provider]    Physical Exam: Vitals:   11/01/16 0955 11/01/16 0958 11/01/16 1030 11/01/16 1130  BP:   (!) 155/91 (!) 157/74  Pulse: 79 84 75 89  Resp: (!) 26 (!) 30 17 18   Temp:    98.3 F (36.8 C)  TempSrc:    Oral  SpO2: 90% 93% 94% 95%  Weight:      Height:        Constitutional: NAD, calm, comfortable Vitals:   11/01/16 0955 11/01/16 0958 11/01/16 1030 11/01/16 1130  BP:   (!) 155/91 (!) 157/74  Pulse: 79 84 75 89  Resp: (!) 26 (!) 30 17 18   Temp:    98.3 F (36.8 C)  TempSrc:    Oral  SpO2: 90% 93% 94% 95%  Weight:      Height:       Eyes: PERRL, lids and conjunctivae normal ENMT: Mucous membranes are moist. Posterior pharynx clear of any exudate or lesions.Normal dentition.  Neck: normal, supple, no masses, no thyromegaly Respiratory: clear to auscultation bilaterally, no wheezing, no crackles. Normal respiratory effort. No accessory muscle use.  Cardiovascular: irregular rate and rhythm, no murmurs / rubs / gallops. 2+ extremity edema. 2+ pedal pulses. No carotid bruits.  Abdomen: no tenderness, no masses palpated. No hepatosplenomegaly. Bowel sounds positive.  Musculoskeletal: no clubbing / cyanosis. No joint deformity upper and lower extremities. Good ROM, no contractures. Normal muscle tone.  Skin: laceration over left elbow Neurologic: CN 2-12 grossly intact. Sensation intact,  DTR normal. Strength 5/5 in all 4.  Psychiatric: Normal judgment and insight. Alert and oriented x 3. Normal mood.    Labs on Admission: I have personally reviewed following labs and imaging studies  CBC:  Recent Labs Lab 11/01/16 0919  WBC 10.5  NEUTROABS 8.4*  HGB 12.5*  HCT 38.1*  MCV 95.7  PLT 118*   Basic Metabolic Panel:  Recent Labs Lab 11/01/16 0919  NA 138  K 3.4*  CL 102  CO2 26  GLUCOSE 136*  BUN 15  CREATININE 1.16  CALCIUM 9.0   GFR: Estimated Creatinine Clearance: 42.4 mL/min (by C-G formula based on SCr of 1.16 mg/dL). Liver Function Tests:  Recent Labs Lab 11/01/16 0919  AST 40  ALT  24  ALKPHOS 124  BILITOT 1.9*  PROT 6.6  ALBUMIN 3.2*   No results for input(s): LIPASE, AMYLASE in the last 168 hours. No results for input(s): AMMONIA in the last 168 hours. Coagulation Profile:  Recent Labs Lab 10/27/16 1114  INR 2.8   Cardiac Enzymes: No results for input(s): CKTOTAL, CKMB, CKMBINDEX, TROPONINI in the last 168 hours. BNP (last 3 results) No results for input(s): PROBNP in the last 8760 hours. HbA1C: No results for input(s): HGBA1C in the last 72 hours. CBG: No results for input(s): GLUCAP in the last 168 hours. Lipid Profile: No results for input(s): CHOL, HDL, LDLCALC, TRIG, CHOLHDL, LDLDIRECT in the last 72 hours. Thyroid Function Tests: No results for input(s): TSH, T4TOTAL, FREET4, T3FREE, THYROIDAB in the last 72 hours. Anemia Panel: No results for input(s): VITAMINB12, FOLATE, FERRITIN, TIBC, IRON, RETICCTPCT in the last 72 hours. Urine analysis:    Component Value Date/Time   COLORURINE YELLOW 11/01/2016 0913   APPEARANCEUR CLEAR 11/01/2016 0913   LABSPEC 1.013 11/01/2016 0913   PHURINE 7.0 11/01/2016 0913   GLUCOSEU NEGATIVE 11/01/2016 0913   HGBUR NEGATIVE 11/01/2016 0913   BILIRUBINUR NEGATIVE 11/01/2016 0913   KETONESUR 5 (A) 11/01/2016 0913   PROTEINUR 30 (A) 11/01/2016 0913   UROBILINOGEN 0.2 03/12/2015 1349     NITRITE NEGATIVE 11/01/2016 0913   LEUKOCYTESUR NEGATIVE 11/01/2016 0913    Radiological Exams on Admission: Dg Chest 1 View  Result Date: 11/01/2016 CLINICAL DATA:  Hip fracture. EXAM: CHEST 1 VIEW COMPARISON:  Chest x-ray a 12/18/2015 FINDINGS: The heart is enlarged but overall stable given the AP projection and portable technique. There is tortuosity, ectasia and calcification of the thoracic aorta. Right lower lobe airspace opacity worrisome for infiltrate. Some of this could be layering pleural fluid also. An upright two view chest x-ray may be helpful for further evaluation. IMPRESSION: Right lower lobe airspace process and possible pleural fluid. Upright two view chest x-ray may be helpful for further evaluation but findings suspicious for pneumonia. Electronically Signed   By: Rudie Meyer M.D.   On: 11/01/2016 11:37   Dg Elbow Complete Left  Result Date: 11/01/2016 CLINICAL DATA:  Fall last night.  Scan laceration on elbow EXAM: LEFT ELBOW - COMPLETE 3+ VIEW COMPARISON:  None. FINDINGS: No visible fracture, subluxation or dislocation. No visible joint effusion although the lateral view is suboptimal due to positioning. Vascular calcifications noted. IMPRESSION: No acute bony abnormality visualized. Electronically Signed   By: Charlett Nose M.D.   On: 11/01/2016 10:10   Dg Hip Unilat W Or Wo Pelvis 2-3 Views Left  Result Date: 11/01/2016 CLINICAL DATA:  83 year old male with left hip pain after falling last night EXAM: DG HIP (WITH OR WITHOUT PELVIS) 2-3V LEFT COMPARISON:  Concurrently obtained radiographs of the left elbow FINDINGS: The bones appear diffusely demineralized. There is a displaced fracture through the left superior pubic ramus and a minimally displaced fracture through the inferior pubic ramus. The left proximal femur remains intact and the humeral head remains located. Surgical changes of prior right total hip arthroplasty without evidence of hardware complication. Exuberant  callus present inferior to the lesser trochanter. Atherosclerotic calcifications present in the bilateral common femoral arteries. Multilevel degenerative disc disease visualized in the lumbar spine. The visualized bowel gas pattern is unremarkable. IMPRESSION: 1. Displaced fracture of the left superior pubic ramus. 2. Minimally displaced fracture of the left inferior pubic ramus. 3. Surgical changes of prior right total hip arthroplasty without evidence of hardware complication.  4. The bones appear diffusely demineralized. 5. Lower lumbar degenerative disc disease. Electronically Signed   By: Malachy Moan M.D.   On: 11/01/2016 10:11    EKG: Independently reviewed. Atrial fibrillation  Assessment/Plan Principal Problem:   Pelvic fracture (HCC) Active Problems:   Atrial fibrillation, chronic (HCC)   Hypertension   CKD (chronic kidney disease), stage III   Hyperlipidemia    1. Fall with pelvic fracture. Management will be nonoperative. Patient should be weightbearing as tolerated. Continue with pain management. Physical therapy evaluation.  2. Chronic Atrial fibrillation. Heart rate is controlled. Continue on beta blockers. He is anticoagulated with Coumadin.  3. Hypertension. Blood pressure elevated on admission, likely related to uncontrolled pain. He's been restarted on his regular blood pressure medications and pain is better controlled. Overall blood pressures improving.  4. Chronic kidney disease stage III. Creatinine is currently at baseline.  5. Hyperlipidemia. Continue statin  DVT prophylaxis: coumadin Code Status: full Family Communication: discussed with son at the bedside Disposition Plan: pending physical therapy evaluation Consults called:  Admission status: observation, Link Snuffer MD [facility] Pager [phone]  If 7PM-7AM, please contact night-coverage [email or web address] Password [email or web address]  11/01/2016, 12:45 PM

## 2016-11-01 NOTE — ED Triage Notes (Signed)
Pt walking last night with walker and fell backwards. Walked to bed. Today can not stand. A/o to most. Skin tear to left elbow. Pt c/o left hip pain. Right leg shortened. ble swelling that is chronic due to pt quit taking his lasix. Pedal pulses faint due to swelling

## 2016-11-01 NOTE — ED Notes (Signed)
Pt back from x-ray.

## 2016-11-02 DIAGNOSIS — Z9049 Acquired absence of other specified parts of digestive tract: Secondary | ICD-10-CM | POA: Diagnosis not present

## 2016-11-02 DIAGNOSIS — Z8673 Personal history of transient ischemic attack (TIA), and cerebral infarction without residual deficits: Secondary | ICD-10-CM | POA: Diagnosis not present

## 2016-11-02 DIAGNOSIS — J189 Pneumonia, unspecified organism: Secondary | ICD-10-CM | POA: Diagnosis present

## 2016-11-02 DIAGNOSIS — N183 Chronic kidney disease, stage 3 (moderate): Secondary | ICD-10-CM | POA: Diagnosis present

## 2016-11-02 DIAGNOSIS — J440 Chronic obstructive pulmonary disease with acute lower respiratory infection: Secondary | ICD-10-CM | POA: Diagnosis present

## 2016-11-02 DIAGNOSIS — Z86711 Personal history of pulmonary embolism: Secondary | ICD-10-CM | POA: Diagnosis not present

## 2016-11-02 DIAGNOSIS — Z86718 Personal history of other venous thrombosis and embolism: Secondary | ICD-10-CM | POA: Diagnosis not present

## 2016-11-02 DIAGNOSIS — I129 Hypertensive chronic kidney disease with stage 1 through stage 4 chronic kidney disease, or unspecified chronic kidney disease: Secondary | ICD-10-CM | POA: Diagnosis present

## 2016-11-02 DIAGNOSIS — W19XXXA Unspecified fall, initial encounter: Secondary | ICD-10-CM | POA: Diagnosis present

## 2016-11-02 DIAGNOSIS — S32810A Multiple fractures of pelvis with stable disruption of pelvic ring, initial encounter for closed fracture: Secondary | ICD-10-CM | POA: Diagnosis present

## 2016-11-02 DIAGNOSIS — M1990 Unspecified osteoarthritis, unspecified site: Secondary | ICD-10-CM | POA: Diagnosis present

## 2016-11-02 DIAGNOSIS — S329XXA Fracture of unspecified parts of lumbosacral spine and pelvis, initial encounter for closed fracture: Secondary | ICD-10-CM | POA: Diagnosis present

## 2016-11-02 DIAGNOSIS — S51012A Laceration without foreign body of left elbow, initial encounter: Secondary | ICD-10-CM | POA: Diagnosis present

## 2016-11-02 DIAGNOSIS — J449 Chronic obstructive pulmonary disease, unspecified: Secondary | ICD-10-CM | POA: Diagnosis present

## 2016-11-02 DIAGNOSIS — I482 Chronic atrial fibrillation: Secondary | ICD-10-CM | POA: Diagnosis present

## 2016-11-02 DIAGNOSIS — M109 Gout, unspecified: Secondary | ICD-10-CM | POA: Diagnosis present

## 2016-11-02 DIAGNOSIS — Z96641 Presence of right artificial hip joint: Secondary | ICD-10-CM | POA: Diagnosis present

## 2016-11-02 DIAGNOSIS — K219 Gastro-esophageal reflux disease without esophagitis: Secondary | ICD-10-CM | POA: Diagnosis present

## 2016-11-02 DIAGNOSIS — Z96652 Presence of left artificial knee joint: Secondary | ICD-10-CM | POA: Diagnosis present

## 2016-11-02 DIAGNOSIS — S32509A Unspecified fracture of unspecified pubis, initial encounter for closed fracture: Secondary | ICD-10-CM | POA: Diagnosis not present

## 2016-11-02 DIAGNOSIS — J9601 Acute respiratory failure with hypoxia: Secondary | ICD-10-CM | POA: Diagnosis present

## 2016-11-02 DIAGNOSIS — Z79899 Other long term (current) drug therapy: Secondary | ICD-10-CM | POA: Diagnosis not present

## 2016-11-02 DIAGNOSIS — E785 Hyperlipidemia, unspecified: Secondary | ICD-10-CM | POA: Diagnosis present

## 2016-11-02 DIAGNOSIS — L8990 Pressure ulcer of unspecified site, unspecified stage: Secondary | ICD-10-CM | POA: Diagnosis present

## 2016-11-02 DIAGNOSIS — Z7901 Long term (current) use of anticoagulants: Secondary | ICD-10-CM | POA: Diagnosis not present

## 2016-11-02 DIAGNOSIS — F0390 Unspecified dementia without behavioral disturbance: Secondary | ICD-10-CM | POA: Diagnosis present

## 2016-11-02 LAB — BASIC METABOLIC PANEL
Anion gap: 8 (ref 5–15)
BUN: 17 mg/dL (ref 6–20)
CHLORIDE: 101 mmol/L (ref 101–111)
CO2: 27 mmol/L (ref 22–32)
Calcium: 8.7 mg/dL — ABNORMAL LOW (ref 8.9–10.3)
Creatinine, Ser: 1.21 mg/dL (ref 0.61–1.24)
GFR calc Af Amer: 58 mL/min — ABNORMAL LOW (ref >60)
GFR calc non Af Amer: 50 mL/min — ABNORMAL LOW (ref >60)
GLUCOSE: 123 mg/dL — AB (ref 65–99)
Potassium: 3.3 mmol/L — ABNORMAL LOW (ref 3.5–5.1)
Sodium: 136 mmol/L (ref 135–145)

## 2016-11-02 LAB — CBC
HEMATOCRIT: 34 % — AB (ref 39.0–52.0)
Hemoglobin: 11.4 g/dL — ABNORMAL LOW (ref 13.0–17.0)
MCH: 32.1 pg (ref 26.0–34.0)
MCHC: 33.5 g/dL (ref 30.0–36.0)
MCV: 95.8 fL (ref 78.0–100.0)
Platelets: 100 10*3/uL — ABNORMAL LOW (ref 150–400)
RBC: 3.55 MIL/uL — ABNORMAL LOW (ref 4.22–5.81)
RDW: 15.4 % (ref 11.5–15.5)
WBC: 10.1 10*3/uL (ref 4.0–10.5)

## 2016-11-02 LAB — GLUCOSE, CAPILLARY: Glucose-Capillary: 117 mg/dL — ABNORMAL HIGH (ref 65–99)

## 2016-11-02 LAB — PROTIME-INR
INR: 3.81
Prothrombin Time: 38.5 seconds — ABNORMAL HIGH (ref 11.4–15.2)

## 2016-11-02 MED ORDER — AZITHROMYCIN 500 MG IV SOLR
500.0000 mg | INTRAVENOUS | Status: DC
  Administered 2016-11-02: 500 mg via INTRAVENOUS
  Filled 2016-11-02 (×2): qty 500

## 2016-11-02 MED ORDER — DEXTROSE 5 % IV SOLN
1.0000 g | INTRAVENOUS | Status: DC
  Administered 2016-11-02: 1 g via INTRAVENOUS
  Filled 2016-11-02 (×2): qty 10

## 2016-11-02 MED ORDER — MORPHINE SULFATE (PF) 4 MG/ML IV SOLN
2.0000 mg | INTRAVENOUS | Status: DC | PRN
  Administered 2016-11-02 – 2016-11-03 (×2): 2 mg via INTRAVENOUS
  Filled 2016-11-02 (×2): qty 1

## 2016-11-02 NOTE — Care Management Note (Signed)
Case Management Note  Patient Details  Name: James KindleJames L Hulett MRN: [id number] Date of Birth: [birth year]  Subjective/Objective:                  Pt admitted with pelvic fx. He is from home with wife. He uses walker pta. SNF has been recommended family agreeable and CSW working with pt on placement.   Action/Plan: Pt will DC to SNF. No CM needs anticipated.   Expected Discharge Date:     11/02/2016             Expected Discharge Plan:  Skilled Nursing Facility  In-House Referral:  Clinical Social Work  Discharge planning Services  CM Consult  Post Acute Care Choice:  NA Choice offered to:  NA  Status of Service:  Completed, signed off  Malcolm MetroChildress, Asja Frommer Demske, RN 11/02/2016, 2:14 PM

## 2016-11-02 NOTE — Consult Note (Signed)
CONSULT NOTE  DR Juanetta Gosling REQUESTING   REASON LEFT PELVIC FRACTURE OF THE PUBIC RAMI  Chief Complaint  Patient presents with  . Fall  . Hip Pain    THIS IS THE HISTORY THAT IS IN THE RECORD  AND CONFIRMED WITH THE PATIENT  HPI: James [name] is a 83 y.o. male with medical history significant of hypertension, atrial fibrillation on Coumadin, chronic kidney disease stage III, presents to the hospital today after suffering a fall. Patient was ambulating with his walker when he had a fall. He denies any syncope, head trauma, dizziness. He cannot describe the circumstances of fall. He took his wife and son to help lift him up and get into bed. Denies any chest pain, shortness of breath. No recent fevers. No vomiting or diarrhea.   ED Course: In the emergency room, he was noted to be hypertensive likely related to uncontrolled pain. Imaging indicated pelvic fracture. Remainder of lab work was unremarkable. Referred for admission.   Past Surgical History:  Procedure Laterality Date  . APPENDECTOMY    . CHOLECYSTECTOMY    . COLONOSCOPY    . ESOPHAGEAL DILATION N/A 07/30/2016   Procedure: ESOPHAGEAL DILATION;  Surgeon: Malissa Hippo, MD;  Location: [facility];  Service: Endoscopy;  Laterality: N/A;  . ESOPHAGOGASTRODUODENOSCOPY N/A 07/30/2016   Procedure: ESOPHAGOGASTRODUODENOSCOPY (EGD);  Surgeon: Malissa Hippo, MD;  [facility]: [facility];  Service: Endoscopy;  Laterality: N/A;  730  . EYE SURGERY Bilateral   . HEMORRHOID SURGERY    . HIP FRACTURE SURGERY    . JOINT REPLACEMENT    . RENAL ARTERY STENT    . TOTAL KNEE REVISION Left 03/18/2015   Procedure: TOTAL KNEE REVISION;  Surgeon: Valeria Batman, MD;  Location: [facility] OR;  Service: Orthopedics;  Laterality: Left;  . WISDOM TOOTH EXTRACTION     Meds ordered this encounter  Medications  . morphine 4 MG/ML injection 4 mg  . bacitracin ointment 1 application  . DISCONTD: metoprolol (LOPRESSOR) tablet 50 mg  . DISCONTD:  lisinopril (PRINIVIL,ZESTRIL) tablet 40 mg  . pantoprazole (PROTONIX) EC tablet 40 mg  . simvastatin (ZOCOR) tablet 40 mg  . metoprolol (LOPRESSOR) tablet 50 mg  . lisinopril (PRINIVIL,ZESTRIL) tablet 40 mg  . potassium chloride (K-DUR,KLOR-CON) CR tablet 10 mEq  . polyethylene glycol (MIRALAX / GLYCOLAX) packet 17 g  . tamsulosin (FLOMAX) capsule 0.4 mg  . DISCONTD: warfarin (COUMADIN) tablet 1.25-2.5 mg    Take 1.25mg  (one-half tablet) on MWF, then take 2.5mg  (one whole tablet) on all other days as directed per [facility]    . furosemide (LASIX) tablet 20 mg  . allopurinol (ZYLOPRIM) tablet 300 mg  . OR Linked Order Group   . acetaminophen (TYLENOL) tablet 650 mg   . acetaminophen (TYLENOL) suppository 650 mg  . HYDROcodone-acetaminophen (NORCO/VICODIN) 5-325 MG per tablet 1-2 tablet  . sodium chloride flush (NS) 0.9 % injection 3 mL  . sodium chloride flush (NS) 0.9 % injection 3 mL  . 0.9 %  sodium chloride infusion  . OR Linked Order Group   . ondansetron (ZOFRAN) tablet 4 mg   . ondansetron (ZOFRAN) injection 4 mg  . Warfarin - Pharmacist Dosing Inpatient  . warfarin (COUMADIN) tablet 1.25 mg    Allergies  Allergen Reactions  . No Known Allergies     BP (!) 159/75 (BP Location: Right Arm)   Pulse 66   Temp 98.1 F (36.7 C) (Oral)   Resp 20   Ht 5'  6" (1.676 m)   Wt 195 lb (88.5 kg)   SpO2 96%   BMI 31.47 kg/m   Physical Exam  Constitutional: He appears well-developed and well-nourished.  Vital signs have been reviewed and are stable. Gen. appearance the patient is well-developed and well-nourished with normal grooming and hygiene. The patient is oriented 3 with normal mood and affect.  Vitals reviewed.  RIGHT LEG   Right leg limb alignment is normal. There are no contracture subluxation atrophy or tremor neurovascular exam is normal and skin is intact  On the left side we have normal leg alignment is well without contracture subluxation atrophy or tremor  and the neurovascular exam is intact skin is normal  The pelvic exam shows tenderness over the left side of the pelvis but the pelvis is stable  Imaging shows a left superior neck. Pubic ramus fracture  This can be treated nonoperatively with pain control, physical therapy, and weightbearing as tolerated with a walker  6 weeks from now the patient should have a pelvic x-ray and a follow-up in the office

## 2016-11-02 NOTE — Progress Notes (Signed)
Physical Therapy Treatment Patient Details Name: James [name] MRN: [id number] DOB: 10/08/1921 Today's Date: 11/02/2016    History of Present Illness 83 y.o. male with medical history significant of hypertension, atrial fibrillation on Coumadin, chronic kidney disease stage III, presents to the hospital today after suffering a fall. Patient was ambulating. He denies any syncope, head trauma, dizziness.  Denies any chest pain, shortness of breath. No recent fevers. No vomiting or diarrhea.  Pt found to have displaced fx of the L superior pubic ramus, and minimally displaced fx of the L inferior pubic ramus.  CXR also shows some possible PNA.     PT Comments    Pt received in bed, just finished with lunch, and is agreeable to PT tx.  Pt was medicated prior to tx.  He continues to require Max A for transfer supine<>sit, and upon coming to the EOB today, he demonstrated strong lean to the right.  He was unable to shift weight to midline due to pain.  Unable to attempt standing today.  Pt required total A for transfer sit<>supine, and supine scoot in the bed.  Continue to recommend SNF at this time.      Follow Up Recommendations  SNF     Equipment Recommendations  None recommended by PT    Recommendations for Other Services       Precautions / Restrictions Precautions Precautions: Fall Precaution Comments: Total of 3 falls in the past 6 months - one fall off the bed, one other fall in the bathroom.   Restrictions Weight Bearing Restrictions: No    Mobility  Bed Mobility Overal bed mobility: Needs Assistance Bed Mobility: Supine to Sit;Sit to Supine     Supine to sit: Max assist;HOB elevated Sit to supine: Total assist   General bed mobility comments: Assist to move LE's off the EOB, and assist to scoot hips forward with bed pads.  Once on the EOB, he demonstrates a strong lean to the right and is unable to shift his weight to the left to obtain midline despite multiple attempts to  do so.  Pt fatigued, and requested to return to supine.  bed placed in trendelenburg to perform supine scoot.    Transfers Overall transfer level:  (Unable to attempt due to pt's poor abiltiy to obtain midline today.   Unable to use lift to transfer pt into the chair due to pt having had a BM.  )                  Ambulation/Gait                 Stairs            Wheelchair Mobility    Modified Rankin (Stroke Patients Only)       Balance Overall balance assessment: History of Falls;Needs assistance Sitting-balance support: Feet supported;Bilateral upper extremity supported Sitting balance-Leahy Scale: Poor Sitting balance - Comments: strong lean to the right with inability to obtain midline today due to pain, and weakness.                                      Cognition Arousal/Alertness: Awake/alert Behavior During Therapy: WFL for tasks assessed/performed                                   General  Comments: HOH      Exercises      General Comments        Pertinent Vitals/Pain Pain Assessment: Faces Faces Pain Scale: Hurts whole lot Pain Location: L pelvis/hip area with movement.  Pain Descriptors / Indicators: Grimacing;Guarding Pain Intervention(s): Limited activity within patient's tolerance;Repositioned;Premedicated before session    Home Living                      Prior Function            PT Goals (current goals can now be found in the care plan section) Acute Rehab PT Goals Patient Stated Goal: Pt wants to get stronger PT Goal Formulation: With patient/family Time For Goal Achievement: 11/15/16 Potential to Achieve Goals: Poor Progress towards PT goals: Not progressing toward goals - comment    Frequency    Min 5X/week      PT Plan Current plan remains appropriate    Co-evaluation              AM-PAC PT "6 Clicks" Daily Activity  Outcome Measure  Difficulty turning over in  bed (including adjusting bedclothes, sheets and blankets)?: A Lot Difficulty moving from lying on back to sitting on the side of the bed? : A Lot Difficulty sitting down on and standing up from a chair with arms (e.g., wheelchair, bedside commode, etc,.)?: A Lot Help needed moving to and from a bed to chair (including a wheelchair)?: Total Help needed walking in hospital room?: Total Help needed climbing 3-5 steps with a railing? : Total 6 Click Score: 9    End of Session   Activity Tolerance: Patient limited by fatigue;Patient limited by pain Patient left: in bed;with call bell/phone within reach Nurse Communication:  (Val, RN notified of pt's mobility status.   Need to use Hoyer lift for transfer OOB<>chair) PT Visit Diagnosis: Unsteadiness on feet (R26.81);Other abnormalities of gait and mobility (R26.89);Muscle weakness (generalized) (M62.81);History of falling (Z91.81);Repeated falls (R29.6);Difficulty in walking, not elsewhere classified (R26.2)     Time: 1245-1300 PT Time Calculation (min) (ACUTE ONLY): 15 min  Charges:  $Therapeutic Activity: 8-22 mins                    G Codes:       Beth Eashan Schipani, PT, DPT X: [phone]

## 2016-11-02 NOTE — [place] FL2 (Signed)
MEDICAID FL2 LEVEL OF CARE SCREENING TOOL     IDENTIFICATION  Patient Name: James KindleJames L Creeden Birthdate: May 11, 1922 Sex: male Admission Date (Current Location): 11/01/2016  [facility] [place]   Facility and Address:  [facility],  [address], [place] [id number]      Provider Number: [id number]  Attending Physician Name and Address:  Kari BaarsHawkins, Edward, MD  Relative Name and Phone Number:       Current Level of Care: Hospital Recommended Level of Care: Skilled Nursing Facility Prior Approval Number:    Date Approved/Denied:   PASRR Number: [id number] ([id number])  Discharge Plan: SNF    Current Diagnoses: Patient Active Problem List   Diagnosis Date Noted  . Pressure injury of skin 11/02/2016  . Pelvis fracture (HCC) 11/02/2016  . Pelvic fracture (HCC) 11/01/2016  . Dysphagia 07/26/2016  . Systolic murmur 02/25/2016  . 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 S/P total knee replacement using cement 03/18/2015  . Encounter for therapeutic drug monitoring 08/08/2013  . Atrial fibrillation, chronic (HCC)   . Anemia   . Hypertension   . CKD (chronic kidney disease), stage III   . Hyperlipidemia   . Cerebrovascular disease   . Chronic anticoagulation   . Fasting hyperglycemia   . HERPES ZOSTER 06/18/2009  . Gout 06/16/2009    Orientation RESPIRATION BLADDER Height & Weight     Self, Time, Situation, Place  O2 (2L) Continent Weight: 195 lb (88.5 kg) Height:  5\' 6"  (167.6 cm)  BEHAVIORAL SYMPTOMS/MOOD NEUROLOGICAL BOWEL NUTRITION STATUS      Continent Diet (Heart Healthy )  AMBULATORY STATUS COMMUNICATION OF NEEDS Skin   Extensive Assist Verbally PU Stage and Appropriate Care (sacrum)                       Personal Care Assistance Level of Assistance  Bathing, Dressing, Feeding Bathing Assistance: Maximum assistance Feeding assistance: Independent Dressing Assistance: Maximum assistance      Functional Limitations Info  Sight, Hearing, Speech Sight Info: Adequate Hearing Info: Adequate Speech Info: Adequate    SPECIAL CARE FACTORS FREQUENCY  PT (By licensed PT)     PT Frequency: 5x/week              Contractures Contractures Info: Not present    Additional Factors Info  Code Status Code Status Info: Full Code             Current Medications (11/02/2016):  This is the current hospital active medication list Current Facility-Administered Medications  Medication Dose Route Frequency Provider Last Rate Last Dose  . 0.9 %  sodium chloride infusion  250 mL Intravenous PRN Erick BlinksMemon, Jehanzeb, MD      . acetaminophen (TYLENOL) tablet 650 mg  650 mg Oral Q6H PRN Erick BlinksMemon, Jehanzeb, MD       Or  . acetaminophen (TYLENOL) suppository 650 mg  650 mg Rectal Q6H PRN Erick BlinksMemon, Jehanzeb, MD      . allopurinol (ZYLOPRIM) tablet 300 mg  300 mg Oral Q supper Erick BlinksMemon, Jehanzeb, MD   300 mg at 11/01/16 1638  . bacitracin ointment 1 application  1 application Topical BID Eber HongMiller, Brian, MD   1 application at 11/01/16 2223  . furosemide (LASIX) tablet 20 mg  20 mg Oral Daily Erick BlinksMemon, Jehanzeb, MD   20 mg at 11/01/16 1358  . HYDROcodone-acetaminophen (NORCO/VICODIN) 5-325 MG per tablet 1-2 tablet  1-2 tablet Oral Q4H PRN Erick BlinksMemon, Jehanzeb, MD  2 tablet at 11/02/16 1610  . lisinopril (PRINIVIL,ZESTRIL) tablet 40 mg  40 mg Oral Daily Memon, Durward Mallard, MD      . metoprolol (LOPRESSOR) tablet 50 mg  50 mg Oral BID Erick Blinks, MD   50 mg at 11/01/16 2223  . morphine 4 MG/ML injection 2 mg  2 mg Intravenous Q4H PRN Kari Baars, MD      . ondansetron [facility]) tablet 4 mg  4 mg Oral Q6H PRN Erick Blinks, MD       Or  . ondansetron (ZOFRAN) injection 4 mg  4 mg Intravenous Q6H PRN Erick Blinks, MD      . pantoprazole (PROTONIX) EC tablet 40 mg  40 mg Oral Daily Erick Blinks, MD   40 mg at 11/01/16 1358  . polyethylene glycol (MIRALAX / GLYCOLAX) packet 17 g  17 g Oral QHS Erick Blinks, MD   17 g at 11/01/16 2224  . potassium chloride (K-DUR,KLOR-CON) CR tablet 10 mEq  10 mEq Oral Daily Erick Blinks, MD   10 mEq at 11/01/16 1358  . simvastatin (ZOCOR) tablet 40 mg  40 mg Oral q1800 Erick Blinks, MD   40 mg at 11/01/16 1638  . sodium chloride flush (NS) 0.9 % injection 3 mL  3 mL Intravenous Q12H Erick Blinks, MD   3 mL at 11/01/16 2224  . sodium chloride flush (NS) 0.9 % injection 3 mL  3 mL Intravenous PRN Erick Blinks, MD      . tamsulosin (FLOMAX) capsule 0.4 mg  0.4 mg Oral Daily Erick Blinks, MD   0.4 mg at 11/01/16 1355  . Warfarin - Pharmacist Dosing Inpatient   Does not apply Q24H Kari Baars, MD   Stopped at 11/02/16 1600     Discharge Medications: Please see discharge summary for a list of discharge medications.  Relevant Imaging Results:  Relevant Lab Results:   Additional Information SSN [id number] Juleen China, LCSW

## 2016-11-02 NOTE — Clinical Social Work Placement (Signed)
   CLINICAL SOCIAL WORK PLACEMENT  NOTE  Date:  11/02/2016  Patient Details  Name: James [name] MRN: [id number] Date of Birth: 11/16/1921  Clinical Social Work is seeking post-discharge placement for this patient at the Skilled  Nursing Facility level of care (*CSW will initial, date and re-position this form in  chart as items are completed):  Yes   Patient/family provided with [place] Clinical Social Work Department's list of facilities offering this level of care within the geographic area requested by the patient (or if unable, by the patient's family).  Yes   Patient/family informed of their freedom to choose among providers that offer the needed level of care, that participate in Medicare, Medicaid or managed care program needed by the patient, have an available bed and are willing to accept the patient.  Yes   Patient/family informed of [place]'s ownership interest in [facility] and [facility], as well as of the fact that they are under no obligation to receive care at these facilities.  PASRR submitted to EDS on       PASRR number received on       Existing PASRR number confirmed on 11/02/16     FL2 transmitted to all facilities in geographic area requested by pt/family on 11/02/16     FL2 transmitted to all facilities within larger geographic area on       Patient informed that his/her managed care company has contracts with or will negotiate with certain facilities, including the following:            Patient/family informed of bed offers received.  Patient chooses bed at       Physician recommends and patient chooses bed at      Patient to be transferred to   on  .  Patient to be transferred to facility by       Patient family notified on   of transfer.  Name of family member notified:        PHYSICIAN       Additional Comment: Blue Medicare Auth faxed to [employer]   _______________________________________________ [name], Israel Wunder D,  LCSW 11/02/2016, 2:26 PM

## 2016-11-02 NOTE — Progress Notes (Addendum)
Subjective: He has a pelvic fracture. He fell about 2 days ago. He initially did not complain of any pain. He has been hypoxic and he is now on oxygen which is a new problem for him. He has history of heart failure coronary disease some COPD chronic atrial fib multiple orthopedic abnormalities.  Objective: Vital signs in last 24 hours: Temp:  [98 F (36.7 C)-98.3 F (36.8 C)] 98.1 F (36.7 C) (05/08 0611) Pulse Rate:  [48-89] 66 (05/08 0611) Resp:  [17-30] 20 (05/08 0611) BP: (110-229)/(56-123) 159/75 (05/08 0611) SpO2:  [90 %-100 %] 96 % (05/08 0611) Weight:  [88.5 kg (195 lb)] 88.5 kg (195 lb) (05/07 0852) Weight change:  Last BM Date: 10/31/16  Intake/Output from previous day: 05/07 0701 - 05/08 0700 In: 483 [P.O.:480; I.V.:3] Out: 2 [Urine:2]  PHYSICAL EXAM General appearance: alert, cooperative and mild distress Resp: clear to auscultation bilaterally Cardio: irregularly irregular rhythm GI: soft, non-tender; bowel sounds normal; no masses,  no organomegaly Extremities: Tender on the left pelvic area Skin warm and dry. He has several skin lesions that have been covered  Lab Results:  Results for orders placed or performed during the hospital encounter of 11/01/16 (from the past 48 hour(s))  Urinalysis, Routine w reflex microscopic     Status: Abnormal   Collection Time: 11/01/16  9:13 AM  Result Value Ref Range   Color, Urine YELLOW YELLOW   APPearance CLEAR CLEAR   Specific Gravity, Urine 1.013 1.005 - 1.030   pH 7.0 5.0 - 8.0   Glucose, UA NEGATIVE NEGATIVE mg/dL   Hgb urine dipstick NEGATIVE NEGATIVE   Bilirubin Urine NEGATIVE NEGATIVE   Ketones, ur 5 (A) NEGATIVE mg/dL   Protein, ur 30 (A) NEGATIVE mg/dL   Nitrite NEGATIVE NEGATIVE   Leukocytes, UA NEGATIVE NEGATIVE   RBC / HPF 6-30 0 - 5 RBC/hpf   WBC, UA 0-5 0 - 5 WBC/hpf   Bacteria, UA NONE SEEN NONE SEEN   Squamous Epithelial / LPF 0-5 (A) NONE SEEN   Hyaline Casts, UA PRESENT   CBC with  Differential/Platelet     Status: Abnormal   Collection Time: 11/01/16  9:19 AM  Result Value Ref Range   WBC 10.5 4.0 - 10.5 K/uL   RBC 3.98 (L) 4.22 - 5.81 MIL/uL   Hemoglobin 12.5 (L) 13.0 - 17.0 g/dL   HCT 38.1 (L) 39.0 - 52.0 %   MCV 95.7 78.0 - 100.0 fL   MCH 31.4 26.0 - 34.0 pg   MCHC 32.8 30.0 - 36.0 g/dL   RDW 15.3 11.5 - 15.5 %   Platelets 118 (L) 150 - 400 K/uL    Comment: SPECIMEN CHECKED FOR CLOTS PLATELET COUNT CONFIRMED BY SMEAR    Neutrophils Relative % 80 %   Neutro Abs 8.4 (H) 1.7 - 7.7 K/uL   Lymphocytes Relative 9 %   Lymphs Abs 0.9 0.7 - 4.0 K/uL   Monocytes Relative 10 %   Monocytes Absolute 1.1 (H) 0.1 - 1.0 K/uL   Eosinophils Relative 1 %   Eosinophils Absolute 0.1 0.0 - 0.7 K/uL   Basophils Relative 0 %   Basophils Absolute 0.0 0.0 - 0.1 K/uL  Comprehensive metabolic panel     Status: Abnormal   Collection Time: 11/01/16  9:19 AM  Result Value Ref Range   Sodium 138 135 - 145 mmol/L   Potassium 3.4 (L) 3.5 - 5.1 mmol/L   Chloride 102 101 - 111 mmol/L   CO2 26 22 - 32  mmol/L   Glucose, Bld 136 (H) 65 - 99 mg/dL   BUN 15 6 - 20 mg/dL   Creatinine, Ser 5.25 0.61 - 1.24 mg/dL   Calcium 9.0 8.9 - 36.4 mg/dL   Total Protein 6.6 6.5 - 8.1 g/dL   Albumin 3.2 (L) 3.5 - 5.0 g/dL   AST 40 15 - 41 U/L   ALT 24 17 - 63 U/L   Alkaline Phosphatase 124 38 - 126 U/L   Total Bilirubin 1.9 (H) 0.3 - 1.2 mg/dL   GFR calc non Af Amer 53 (L) >60 mL/min   GFR calc Af Amer >60 >60 mL/min    Comment: (NOTE) The eGFR has been calculated using the CKD EPI equation. This calculation has not been validated in all clinical situations. eGFR's persistently <60 mL/min signify possible Chronic Kidney Disease.    Anion gap 10 5 - 15  Protime-INR     Status: Abnormal   Collection Time: 11/01/16  9:19 AM  Result Value Ref Range   Prothrombin Time 30.8 (H) 11.4 - 15.2 seconds   INR 2.88   Basic metabolic panel     Status: Abnormal   Collection Time: 11/02/16  4:52 AM   Result Value Ref Range   Sodium 136 135 - 145 mmol/L   Potassium 3.3 (L) 3.5 - 5.1 mmol/L   Chloride 101 101 - 111 mmol/L   CO2 27 22 - 32 mmol/L   Glucose, Bld 123 (H) 65 - 99 mg/dL   BUN 17 6 - 20 mg/dL   Creatinine, Ser 8.38 0.61 - 1.24 mg/dL   Calcium 8.7 (L) 8.9 - 10.3 mg/dL   GFR calc non Af Amer 50 (L) >60 mL/min   GFR calc Af Amer 58 (L) >60 mL/min    Comment: (NOTE) The eGFR has been calculated using the CKD EPI equation. This calculation has not been validated in all clinical situations. eGFR's persistently <60 mL/min signify possible Chronic Kidney Disease.    Anion gap 8 5 - 15  CBC     Status: Abnormal   Collection Time: 11/02/16  4:52 AM  Result Value Ref Range   WBC 10.1 4.0 - 10.5 K/uL   RBC 3.55 (L) 4.22 - 5.81 MIL/uL   Hemoglobin 11.4 (L) 13.0 - 17.0 g/dL   HCT 93.0 (L) 68.4 - 05.0 %   MCV 95.8 78.0 - 100.0 fL   MCH 32.1 26.0 - 34.0 pg   MCHC 33.5 30.0 - 36.0 g/dL   RDW 20.3 55.7 - 33.7 %   Platelets 100 (L) 150 - 400 K/uL    Comment: SPECIMEN CHECKED FOR CLOTS  Protime-INR     Status: Abnormal   Collection Time: 11/02/16  4:52 AM  Result Value Ref Range   Prothrombin Time 38.5 (H) 11.4 - 15.2 seconds   INR 3.81   Glucose, capillary     Status: Abnormal   Collection Time: 11/02/16  7:44 AM  Result Value Ref Range   Glucose-Capillary 117 (H) 65 - 99 mg/dL   Comment 1 Notify RN    Comment 2 Document in Chart     ABGS No results for input(s): PHART, PO2ART, TCO2, HCO3 in the last 72 hours.  Invalid input(s): PCO2 CULTURES No results found for this or any previous visit (from the past 240 hour(s)). Studies/Results: Dg Chest 1 View  Result Date: 11/01/2016 CLINICAL DATA:  Hip fracture. EXAM: CHEST 1 VIEW COMPARISON:  Chest x-ray a 12/18/2015 FINDINGS: The heart is enlarged but overall stable  given the AP projection and portable technique. There is tortuosity, ectasia and calcification of the thoracic aorta. Right lower lobe airspace opacity worrisome  for infiltrate. Some of this could be layering pleural fluid also. An upright two view chest x-ray may be helpful for further evaluation. IMPRESSION: Right lower lobe airspace process and possible pleural fluid. Upright two view chest x-ray may be helpful for further evaluation but findings suspicious for pneumonia. Electronically Signed   By: Marijo Sanes M.D.   On: 11/01/2016 11:37   Dg Elbow Complete Left  Result Date: 11/01/2016 CLINICAL DATA:  Fall last night.  Scan laceration on elbow EXAM: LEFT ELBOW - COMPLETE 3+ VIEW COMPARISON:  None. FINDINGS: No visible fracture, subluxation or dislocation. No visible joint effusion although the lateral view is suboptimal due to positioning. Vascular calcifications noted. IMPRESSION: No acute bony abnormality visualized. Electronically Signed   By: Rolm Baptise M.D.   On: 11/01/2016 10:10   Dg Hip Unilat W Or Wo Pelvis 2-3 Views Left  Result Date: 11/01/2016 CLINICAL DATA:  83 year old male with left hip pain after falling last night EXAM: DG HIP (WITH OR WITHOUT PELVIS) 2-3V LEFT COMPARISON:  Concurrently obtained radiographs of the left elbow FINDINGS: The bones appear diffusely demineralized. There is a displaced fracture through the left superior pubic ramus and a minimally displaced fracture through the inferior pubic ramus. The left proximal femur remains intact and the humeral head remains located. Surgical changes of prior right total hip arthroplasty without evidence of hardware complication. Exuberant callus present inferior to the lesser trochanter. Atherosclerotic calcifications present in the bilateral common femoral arteries. Multilevel degenerative disc disease visualized in the lumbar spine. The visualized bowel gas pattern is unremarkable. IMPRESSION: 1. Displaced fracture of the left superior pubic ramus. 2. Minimally displaced fracture of the left inferior pubic ramus. 3. Surgical changes of prior right total hip arthroplasty without evidence of  hardware complication. 4. The bones appear diffusely demineralized. 5. Lower lumbar degenerative disc disease. Electronically Signed   By: Jacqulynn Cadet M.D.   On: 11/01/2016 10:11    Medications:  Prior to Admission:  Prescriptions Prior to Admission  Medication Sig Dispense Refill Last Dose  . allopurinol (ZYLOPRIM) 300 MG tablet Take 300 mg by mouth daily with supper.    10/31/2016 at Unknown time  . furosemide (LASIX) 40 MG tablet TAKE 1/2 TABLET BY MOUTH DAILY. 45 tablet 2 10/31/2016 at Unknown time  . lisinopril (PRINIVIL,ZESTRIL) 40 MG tablet TAKE ONE TABLET BY MOUTH DAILY. 30 tablet 11 10/31/2016 at Unknown time  . metoprolol (LOPRESSOR) 50 MG tablet TAKE (1) TABLET BY MOUTH TWICE DAILY. 60 tablet 11 10/31/2016 at 2100  . Multiple Vitamin (MULTIVITAMIN) tablet Take 1 tablet by mouth daily.     10/31/2016 at Unknown time  . Multiple Vitamins-Minerals (PRESERVISION AREDS PO) Take 1 tablet by mouth 2 (two) times daily.    10/31/2016 at Unknown time  . pantoprazole (PROTONIX) 40 MG tablet Take 40 mg by mouth daily.   10/31/2016 at Unknown time  . polyethylene glycol powder (GLYCOLAX/MIRALAX) powder Take 17 g by mouth at bedtime.  0 10/31/2016 at Unknown time  . potassium chloride (K-DUR) 10 MEQ tablet TAKE ONE TABLET BY MOUTH DAILY. 30 tablet 11 10/31/2016 at Unknown time  . simvastatin (ZOCOR) 40 MG tablet TAKE (1) TABLET BY MOUTH AT BEDTIME FOR CHOLESTEROL. 30 tablet 0 10/31/2016 at Unknown time  . tamsulosin (FLOMAX) 0.4 MG CAPS capsule Take 0.4 mg by mouth daily.  11 10/31/2016 at  Unknown time  . warfarin (COUMADIN) 2.5 MG tablet Take 1.25-2.5 mg by mouth daily. Take 1.'25mg'$  (one-half tablet) on MWF, then take 2.'5mg'$  (one whole tablet) on all other days as directed per [facility]   10/31/2016 at Unknown time   Scheduled: . allopurinol  300 mg Oral Q supper  . bacitracin  1 application Topical BID  . furosemide  20 mg Oral Daily  . lisinopril  40 mg Oral Daily  . metoprolol  50 mg Oral BID  .  pantoprazole  40 mg Oral Daily  . polyethylene glycol  17 g Oral QHS  . potassium chloride  10 mEq Oral Daily  . simvastatin  40 mg Oral q1800  . sodium chloride flush  3 mL Intravenous Q12H  . tamsulosin  0.4 mg Oral Daily  . Warfarin - Pharmacist Dosing Inpatient   Does not apply Q24H   Continuous: . sodium chloride     OIZ:TIWPYK chloride, acetaminophen **OR** acetaminophen, HYDROcodone-acetaminophen, ondansetron **OR** ondansetron (ZOFRAN) IV, sodium chloride flush  Assesment: He was admitted with pelvic fracture. He has chronic atrial fib on chronic anticoagulation which complicates his situation. He has chronic kidney disease which is stable. He has not been very ambulatory even prior to this. He has pneumonia on cxr and that will need to be treated. Principal Problem:   Pelvic fracture (HCC) Active Problems:   Atrial fibrillation, chronic (HCC)   Hypertension   CKD (chronic kidney disease), stage III   Hyperlipidemia   Pressure injury of skin   Pelvis fracture ([place])    Plan: Continue treatments. See about skilled care facility. Orthopedic consult. Add rocephin and zithromax   LOS: 0 days   Gerasimos Plotts L 11/02/2016, 8:44 AM

## 2016-11-02 NOTE — Clinical Social Work Note (Signed)
Clinical Social Work Assessment  Patient Details  Name: James [name] MRN: [id number] Date of Birth: Oct 21, 1921  Date of referral:                  Reason for consult:  Discharge Planning                Permission sought to share information with:    Permission granted to share information::     Name::        Agency::     Relationship::     Contact Information:  Erick AlleySarah Below, wife   Housing/Transportation Living arrangements for the past 2 months:  Single Family Home Source of Information:  Spouse Patient Interpreter Needed:  None Criminal Activity/Legal Involvement Pertinent to Current Situation/Hospitalization:  No - Comment as needed Significant Relationships:  None Lives with:  Spouse Do you feel safe going back to the place where you live?  Yes Need for family participation in patient care:  Yes (Comment)  Care giving concerns: None identified.    Social Worker assessment / plan:  Mrs. Yetta BarreJones advised that patient ambulates with a walker and that she completes all ADLs for him. She stated that she was agreeable to SNF for rehab and [facility]   Employment status:  Retired Database administratornsurance information:  Managed Medicare PT Recommendations:  Skilled Nursing Facility Information / Referral to community resources:  Skilled Nursing Facility  Patient/Family's Response to care:  Family is agreeable to SNF.   Patient/Family's Understanding of and Emotional Response to Diagnosis, Current Treatment, and Prognosis:  Family understands patient's diagnosis, treatment and prognosis.   Emotional Assessment Appearance:  Appears stated age Attitude/Demeanor/Rapport:  Unable to Assess Affect (typically observed):  Unable to Assess Orientation:  Oriented to Self, Oriented to Place Alcohol / Substance use:  Not Applicable Psych involvement (Current and /or in the community):  No (Comment)  Discharge Needs  Concerns to be addressed:  Discharge Planning Concerns Readmission  within the last 30 days:  No Current discharge risk:  None Barriers to Discharge:  Insurance Authorization   [name], Claira Jeter D, LCSW 11/02/2016, 2:17 PM

## 2016-11-02 NOTE — Progress Notes (Signed)
ANTICOAGULATION CONSULT NOTE   Pharmacy Consult for COUMADIN (home med) Indication: atrial fibrillation  Allergies  Allergen Reactions  . No Known Allergies    Patient Measurements: Height: 5\' 6"  (167.6 cm) Weight: 195 lb (88.5 kg) IBW/kg (Calculated) : 63.8  Vital Signs: Temp: 98.1 F (36.7 C) (05/08 0611) Temp Source: Oral (05/08 0611) BP: 159/75 (05/08 0611) Pulse Rate: 66 (05/08 0611)  Labs:  Recent Labs  11/01/16 0919 11/02/16 0452  HGB 12.5* 11.4*  HCT 38.1* 34.0*  PLT 118* 100*  LABPROT 30.8* 38.5*  INR 2.88 3.81  CREATININE 1.16 1.21   Estimated Creatinine Clearance: 40.6 mL/min (by C-G formula based on SCr of 1.21 mg/dL).  Medical History: Past Medical History:  Diagnosis Date  . Anemia    H&H of 12.4/38.5 with MCV of 110 in /2012  . Arthritis   . Atrial fibrillation, chronic (HCC)    Permanent atrial fibrillation  . Cerebrovascular disease    Left posterior parietal infarction  . Chronic anticoagulation    H&H of 12.4/38.5 in 05/2011; negative stool Hemoccults in 05/2009  . Deep vein thrombosis (DVT) (HCC)   . Dysrhythmia   . Fasting hyperglycemia    Glucose values of 70-135 in 2010-2012  . GERD (gastroesophageal reflux disease)   . Herpes zoster 2011  . History of kidney stones   . Hyperlipidemia    Lipid profile in 05/2011:138, 120, 34, 80  . Hypertension    Lab 05/2011: Normal CMet except glucose of 121 and creatinine of 1.37  . Pulmonary embolism (HCC)   . Stroke (HCC)   . TOTAL KNEE REVISIONLeft    Medications:  Prescriptions Prior to Admission  Medication Sig Dispense Refill Last Dose  . allopurinol (ZYLOPRIM) 300 MG tablet Take 300 mg by mouth daily with supper.    10/31/2016 at Unknown time  . furosemide (LASIX) 40 MG tablet TAKE 1/2 TABLET BY MOUTH DAILY. 45 tablet 2 10/31/2016 at Unknown time  . lisinopril (PRINIVIL,ZESTRIL) 40 MG tablet TAKE ONE TABLET BY MOUTH DAILY. 30 tablet 11 10/31/2016 at Unknown time  . metoprolol (LOPRESSOR)  50 MG tablet TAKE (1) TABLET BY MOUTH TWICE DAILY. 60 tablet 11 10/31/2016 at 2100  . Multiple Vitamin (MULTIVITAMIN) tablet Take 1 tablet by mouth daily.     10/31/2016 at Unknown time  . Multiple Vitamins-Minerals (PRESERVISION AREDS PO) Take 1 tablet by mouth 2 (two) times daily.    10/31/2016 at Unknown time  . pantoprazole (PROTONIX) 40 MG tablet Take 40 mg by mouth daily.   10/31/2016 at Unknown time  . polyethylene glycol powder (GLYCOLAX/MIRALAX) powder Take 17 g by mouth at bedtime.  0 10/31/2016 at Unknown time  . potassium chloride (K-DUR) 10 MEQ tablet TAKE ONE TABLET BY MOUTH DAILY. 30 tablet 11 10/31/2016 at Unknown time  . simvastatin (ZOCOR) 40 MG tablet TAKE (1) TABLET BY MOUTH AT BEDTIME FOR CHOLESTEROL. 30 tablet 0 10/31/2016 at Unknown time  . tamsulosin (FLOMAX) 0.4 MG CAPS capsule Take 0.4 mg by mouth daily.  11 10/31/2016 at Unknown time  . warfarin (COUMADIN) 2.5 MG tablet Take 1.25-2.5 mg by mouth daily. Take 1.25mg  (one-half tablet) on MWF, then take 2.5mg  (one whole tablet) on all other days as directed per Coumadin Clinic   10/31/2016 at Unknown time   Assessment: Pt on Coumadin at home for h/o afib.  INR therapeutic on admission but has now trended up to SUPRAtherapeutic level.  Goal of Therapy:  INR 2-3 Monitor platelets by anticoagulation protocol: Yes  Plan:  HOLD coumadin today. (allow INR to trend down) INR daily  Margo AyeHall, Ilya Neely A 11/02/2016,9:31 AM

## 2016-11-03 ENCOUNTER — Inpatient Hospital Stay
Admission: RE | Admit: 2016-11-03 | Discharge: 2016-12-03 | Disposition: A | Discharge status: Home / Self Care | Payer: Medicare Other | Source: Ambulatory Visit | Attending: Pulmonary Disease | Admitting: Pulmonary Disease

## 2016-11-03 ENCOUNTER — Telehealth: Payer: Self-pay | Admitting: *Deleted

## 2016-11-03 DIAGNOSIS — J189 Pneumonia, unspecified organism: Secondary | ICD-10-CM | POA: Diagnosis present

## 2016-11-03 LAB — PROTIME-INR
INR: 4.68
Prothrombin Time: 44.8 seconds — ABNORMAL HIGH (ref 11.4–15.2)

## 2016-11-03 MED ORDER — AMOXICILLIN-POT CLAVULANATE 875-125 MG PO TABS: 1.0000 | ORAL_TABLET | Freq: Two times a day (BID) | ORAL | 0 refills | Status: AC

## 2016-11-03 MED ORDER — HYDROCODONE-ACETAMINOPHEN 5-325 MG PO TABS: 1.0000 | ORAL_TABLET | ORAL | 0 refills | Status: AC | PRN

## 2016-11-03 NOTE — Care Management Important Message (Signed)
Important Message  Patient Details  Name: James KindleJames L Sweetin MRN: [id number] Date of Birth: June 15, 1922   Medicare Important Message Given:  Yes    Malcolm MetroChildress, Anslee Micheletti Demske, RN 11/03/2016, 11:12 AM

## 2016-11-03 NOTE — Clinical Social Work Placement (Signed)
   CLINICAL SOCIAL WORK PLACEMENT  NOTE  Date:  11/03/2016  Patient Details  Name: James [name] MRN: [id number] Date of Birth: March 02, 1922  Clinical Social Work is seeking post-discharge placement for this patient at the Skilled  Nursing Facility level of care (*CSW will initial, date and re-position this form in  chart as items are completed):  Yes   Patient/family provided with [facility] Clinical Social Work Department's list of facilities offering this level of care within the geographic area requested by the patient (or if unable, by the patient's family).  Yes   Patient/family informed of their freedom to choose among providers that offer the needed level of care, that participate in Medicare, Medicaid or managed care program needed by the patient, have an available bed and are willing to accept the patient.  Yes   Patient/family informed of [facility]'s ownership interest [facility] and [facility], as well as of the fact that they are under no obligation to receive care at these facilities.  PASRR submitted to EDS on       PASRR number received on       Existing PASRR number confirmed on 11/02/16     FL2 transmitted to all facilities in geographic area requested by pt/family on 11/02/16     FL2 transmitted to all facilities within larger geographic area on       Patient informed that his/her managed care company has contracts with or will negotiate with certain facilities, including the following:        Yes   Patient/family informed of bed offers received.  Patient chooses bed at [facility]     Physician recommends and patient chooses bed at      [facility] to [facility] on 11/03/16.  [facility]     Patient family notified on 11/03/16 of transfer.  Name of family member notified:  James [name], spouse, voicemail message     PHYSICIAN       Additional Comment: [facility].  LCSW is awaiting [facility], submitted on yesterday.  LCSW left a message for patinet's wife [facility]. LCSW signing off.     _______________________________________________ Tretha SciaraSettle, Jaquayla Hege D, LCSW 11/03/2016, 10:12 AM

## 2016-11-03 NOTE — Progress Notes (Signed)
[facility]  IV removed - WNL.  Report given to ValloniaDella at facility. Patient and family aware. No questions at this time.  Patient transferring via bed, in NAD at this time.

## 2016-11-03 NOTE — Telephone Encounter (Signed)
[facility], they needs coumadin orders per MinorDella. Please fax to [phone]

## 2016-11-03 NOTE — Clinical Social Work Note (Signed)
LCSW left a voicemail message for Lynnea FerrierKerri advising that patient's authorization number was [id number], 3 days effective 11/03/16, update 11/05/16. RUG level is RVC.      Shota Kohrs, Juleen ChinaHeather D, LCSW

## 2016-11-03 NOTE — Progress Notes (Addendum)
ANTICOAGULATION CONSULT NOTE   Pharmacy Consult for COUMADIN (home med) Indication: atrial fibrillation  Allergies  Allergen Reactions  . No Known Allergies    Patient Measurements: Height: 5\' 6"  (167.6 cm) Weight: 195 lb (88.5 kg) IBW/kg (Calculated) : 63.8  Vital Signs: Temp: 97.9 F (36.6 C) (05/09 0551) Temp Source: Oral (05/09 0551) BP: 155/84 (05/09 0551) Pulse Rate: 73 (05/09 0551)  Labs:  Recent Labs  11/01/16 0919 11/02/16 0452 11/03/16 0609  HGB 12.5* 11.4*  --   HCT 38.1* 34.0*  --   PLT 118* 100*  --   LABPROT 30.8* 38.5* 44.8*  INR 2.88 3.81 4.68*  CREATININE 1.16 1.21  --    Estimated Creatinine Clearance: 40.6 mL/min (by C-G formula based on SCr of 1.21 mg/dL).  Medical History: Past Medical History:  Diagnosis Date  . Anemia    H&H of 12.4/38.5 with MCV of 110 in /2012  . Arthritis   . Atrial fibrillation, chronic (HCC)    Permanent atrial fibrillation  . Cerebrovascular disease    Left posterior parietal infarction  . Chronic anticoagulation    H&H of 12.4/38.5 in 05/2011; negative stool Hemoccults in 05/2009  . Deep vein thrombosis (DVT) (HCC)   . Dysrhythmia   . Fasting hyperglycemia    Glucose values of 70-135 in 2010-2012  . GERD (gastroesophageal reflux disease)   . Herpes zoster 2011  . History of kidney stones   . Hyperlipidemia    Lipid profile in 05/2011:138, 120, 34, 80  . Hypertension    Lab 05/2011: Normal CMet except glucose of 121 and creatinine of 1.37  . Pulmonary embolism (HCC)   . Stroke (HCC)   . TOTAL KNEE REVISIONLeft    Medications:  Prescriptions Prior to Admission  Medication Sig Dispense Refill Last Dose  . allopurinol (ZYLOPRIM) 300 MG tablet Take 300 mg by mouth daily with supper.    10/31/2016 at Unknown time  . furosemide (LASIX) 40 MG tablet TAKE 1/2 TABLET BY MOUTH DAILY. 45 tablet 2 10/31/2016 at Unknown time  . lisinopril (PRINIVIL,ZESTRIL) 40 MG tablet TAKE ONE TABLET BY MOUTH DAILY. 30 tablet 11  10/31/2016 at Unknown time  . metoprolol (LOPRESSOR) 50 MG tablet TAKE (1) TABLET BY MOUTH TWICE DAILY. 60 tablet 11 10/31/2016 at 2100  . Multiple Vitamin (MULTIVITAMIN) tablet Take 1 tablet by mouth daily.     10/31/2016 at Unknown time  . Multiple Vitamins-Minerals (PRESERVISION AREDS PO) Take 1 tablet by mouth 2 (two) times daily.    10/31/2016 at Unknown time  . pantoprazole (PROTONIX) 40 MG tablet Take 40 mg by mouth daily.   10/31/2016 at Unknown time  . polyethylene glycol powder (GLYCOLAX/MIRALAX) powder Take 17 g by mouth at bedtime.  0 10/31/2016 at Unknown time  . potassium chloride (K-DUR) 10 MEQ tablet TAKE ONE TABLET BY MOUTH DAILY. 30 tablet 11 10/31/2016 at Unknown time  . simvastatin (ZOCOR) 40 MG tablet TAKE (1) TABLET BY MOUTH AT BEDTIME FOR CHOLESTEROL. 30 tablet 0 10/31/2016 at Unknown time  . tamsulosin (FLOMAX) 0.4 MG CAPS capsule Take 0.4 mg by mouth daily.  11 10/31/2016 at Unknown time  . warfarin (COUMADIN) 2.5 MG tablet Take 1.25-2.5 mg by mouth daily. Take 1.25mg  (one-half tablet) on MWF, then take 2.5mg  (one whole tablet) on all other days as directed per Coumadin Clinic   10/31/2016 at Unknown time   Assessment: Pt on Coumadin at home for h/o afib.  INR therapeutic on admission but has now trended up to SUPRAtherapeutic  level, most likely due to interaction with Zithromax.    Goal of Therapy:  INR 2-3 Monitor platelets by anticoagulation protocol: Yes   Plan:  HOLD coumadin today. (allow INR to trend down) INR daily  Margo Aye, Aspen Deterding A 11/03/2016,10:36 AM

## 2016-11-03 NOTE — Progress Notes (Signed)
Subjective: He feels okay. He has no new complaints. He is still minimally short of breath. He is wearing oxygen. He has pain in his hip area presumably from his pelvic fracture  Objective: Vital signs in last 24 hours: Temp:  [97.2 F (36.2 C)-98 F (36.7 C)] 97.9 F (36.6 C) (05/09 0551) Pulse Rate:  [57-73] 73 (05/09 0551) Resp:  [20] 20 (05/09 0551) BP: (118-155)/(64-84) 155/84 (05/09 0551) SpO2:  [95 %-100 %] 100 % (05/09 0551) Weight change:  Last BM Date: 11/02/16  Intake/Output from previous day: 05/08 0701 - 05/09 0700 In: 780 [P.O.:480; IV Piggyback:300] Out: -   PHYSICAL EXAM General appearance: alert, cooperative and no distress Resp: clear to auscultation bilaterally Cardio: irregularly irregular rhythm GI: soft, non-tender; bowel sounds normal; no masses,  no organomegaly Extremities: extremities normal, atraumatic, no cyanosis or edema Mild tenderness in the area of the left hip and pelvis  Lab Results:  Results for orders placed or performed during the hospital encounter of 11/01/16 (from the past 48 hour(s))  Urinalysis, Routine w reflex microscopic     Status: Abnormal   Collection Time: 11/01/16  9:13 AM  Result Value Ref Range   Color, Urine YELLOW YELLOW   APPearance CLEAR CLEAR   Specific Gravity, Urine 1.013 1.005 - 1.030   pH 7.0 5.0 - 8.0   Glucose, UA NEGATIVE NEGATIVE mg/dL   Hgb urine dipstick NEGATIVE NEGATIVE   Bilirubin Urine NEGATIVE NEGATIVE   Ketones, ur 5 (A) NEGATIVE mg/dL   Protein, ur 30 (A) NEGATIVE mg/dL   Nitrite NEGATIVE NEGATIVE   Leukocytes, UA NEGATIVE NEGATIVE   RBC / HPF 6-30 0 - 5 RBC/hpf   WBC, UA 0-5 0 - 5 WBC/hpf   Bacteria, UA NONE SEEN NONE SEEN   Squamous Epithelial / LPF 0-5 (A) NONE SEEN   Hyaline Casts, UA PRESENT   CBC with Differential/Platelet     Status: Abnormal   Collection Time: 11/01/16  9:19 AM  Result Value Ref Range   WBC 10.5 4.0 - 10.5 K/uL   RBC 3.98 (L) 4.22 - 5.81 MIL/uL   Hemoglobin 12.5  (L) 13.0 - 17.0 g/dL   HCT 38.1 (L) 39.0 - 52.0 %   MCV 95.7 78.0 - 100.0 fL   MCH 31.4 26.0 - 34.0 pg   MCHC 32.8 30.0 - 36.0 g/dL   RDW 15.3 11.5 - 15.5 %   Platelets 118 (L) 150 - 400 K/uL    Comment: SPECIMEN CHECKED FOR CLOTS PLATELET COUNT CONFIRMED BY SMEAR    Neutrophils Relative % 80 %   Neutro Abs 8.4 (H) 1.7 - 7.7 K/uL   Lymphocytes Relative 9 %   Lymphs Abs 0.9 0.7 - 4.0 K/uL   Monocytes Relative 10 %   Monocytes Absolute 1.1 (H) 0.1 - 1.0 K/uL   Eosinophils Relative 1 %   Eosinophils Absolute 0.1 0.0 - 0.7 K/uL   Basophils Relative 0 %   Basophils Absolute 0.0 0.0 - 0.1 K/uL  Comprehensive metabolic panel     Status: Abnormal   Collection Time: 11/01/16  9:19 AM  Result Value Ref Range   Sodium 138 135 - 145 mmol/L   Potassium 3.4 (L) 3.5 - 5.1 mmol/L   Chloride 102 101 - 111 mmol/L   CO2 26 22 - 32 mmol/L   Glucose, Bld 136 (H) 65 - 99 mg/dL   BUN 15 6 - 20 mg/dL   Creatinine, Ser 1.16 0.61 - 1.24 mg/dL   Calcium 9.0 8.9 -  10.3 mg/dL   Total Protein 6.6 6.5 - 8.1 g/dL   Albumin 3.2 (L) 3.5 - 5.0 g/dL   AST 40 15 - 41 U/L   ALT 24 17 - 63 U/L   Alkaline Phosphatase 124 38 - 126 U/L   Total Bilirubin 1.9 (H) 0.3 - 1.2 mg/dL   GFR calc non Af Amer 53 (L) >60 mL/min   GFR calc Af Amer >60 >60 mL/min    Comment: (NOTE) The eGFR has been calculated using the CKD EPI equation. This calculation has not been validated in all clinical situations. eGFR's persistently <60 mL/min signify possible Chronic Kidney Disease.    Anion gap 10 5 - 15  Protime-INR     Status: Abnormal   Collection Time: 11/01/16  9:19 AM  Result Value Ref Range   Prothrombin Time 30.8 (H) 11.4 - 15.2 seconds   INR 9.50   Basic metabolic panel     Status: Abnormal   Collection Time: 11/02/16  4:52 AM  Result Value Ref Range   Sodium 136 135 - 145 mmol/L   Potassium 3.3 (L) 3.5 - 5.1 mmol/L   Chloride 101 101 - 111 mmol/L   CO2 27 22 - 32 mmol/L   Glucose, Bld 123 (H) 65 - 99 mg/dL    BUN 17 6 - 20 mg/dL   Creatinine, Ser 1.21 0.61 - 1.24 mg/dL   Calcium 8.7 (L) 8.9 - 10.3 mg/dL   GFR calc non Af Amer 50 (L) >60 mL/min   GFR calc Af Amer 58 (L) >60 mL/min    Comment: (NOTE) The eGFR has been calculated using the CKD EPI equation. This calculation has not been validated in all clinical situations. eGFR's persistently <60 mL/min signify possible Chronic Kidney Disease.    Anion gap 8 5 - 15  CBC     Status: Abnormal   Collection Time: 11/02/16  4:52 AM  Result Value Ref Range   WBC 10.1 4.0 - 10.5 K/uL   RBC 3.55 (L) 4.22 - 5.81 MIL/uL   Hemoglobin 11.4 (L) 13.0 - 17.0 g/dL   HCT 34.0 (L) 39.0 - 52.0 %   MCV 95.8 78.0 - 100.0 fL   MCH 32.1 26.0 - 34.0 pg   MCHC 33.5 30.0 - 36.0 g/dL   RDW 15.4 11.5 - 15.5 %   Platelets 100 (L) 150 - 400 K/uL    Comment: SPECIMEN CHECKED FOR CLOTS  Protime-INR     Status: Abnormal   Collection Time: 11/02/16  4:52 AM  Result Value Ref Range   Prothrombin Time 38.5 (H) 11.4 - 15.2 seconds   INR 3.81   Glucose, capillary     Status: Abnormal   Collection Time: 11/02/16  7:44 AM  Result Value Ref Range   Glucose-Capillary 117 (H) 65 - 99 mg/dL   Comment 1 Notify RN    Comment 2 Document in Chart   Protime-INR     Status: Abnormal   Collection Time: 11/03/16  6:09 AM  Result Value Ref Range   Prothrombin Time 44.8 (H) 11.4 - 15.2 seconds   INR 4.68 (HH)     Comment: RESULT REPEATED AND VERIFIED CRITICAL RESULT CALLED TO, READ BACK BY AND VERIFIED WITH: [facility] RN '@725AM'$  MATTHEWS,B 5.9.18     ABGS No results for input(s): PHART, PO2ART, TCO2, HCO3 in the last 72 hours.  Invalid input(s): PCO2 CULTURES No results found for this or any previous visit (from the past 240 hour(s)). Studies/Results: Dg Chest 1 View  Result Date: 11/01/2016 CLINICAL DATA:  Hip fracture. EXAM: CHEST 1 VIEW COMPARISON:  Chest x-ray a 12/18/2015 FINDINGS: The heart is enlarged but overall stable given the AP projection and portable  technique. There is tortuosity, ectasia and calcification of the thoracic aorta. Right lower lobe airspace opacity worrisome for infiltrate. Some of this could be layering pleural fluid also. An upright two view chest x-ray may be helpful for further evaluation. IMPRESSION: Right lower lobe airspace process and possible pleural fluid. Upright two view chest x-ray may be helpful for further evaluation but findings suspicious for pneumonia. Electronically Signed   By: Marijo Sanes M.D.   On: 11/01/2016 11:37   Dg Elbow Complete Left  Result Date: 11/01/2016 CLINICAL DATA:  Fall last night.  Scan laceration on elbow EXAM: LEFT ELBOW - COMPLETE 3+ VIEW COMPARISON:  None. FINDINGS: No visible fracture, subluxation or dislocation. No visible joint effusion although the lateral view is suboptimal due to positioning. Vascular calcifications noted. IMPRESSION: No acute bony abnormality visualized. Electronically Signed   By: Rolm Baptise M.D.   On: 11/01/2016 10:10   Dg Hip Unilat W Or Wo Pelvis 2-3 Views Left  Result Date: 11/01/2016 CLINICAL DATA:  83 year old male with left hip pain after falling last night EXAM: DG HIP (WITH OR WITHOUT PELVIS) 2-3V LEFT COMPARISON:  Concurrently obtained radiographs of the left elbow FINDINGS: The bones appear diffusely demineralized. There is a displaced fracture through the left superior pubic ramus and a minimally displaced fracture through the inferior pubic ramus. The left proximal femur remains intact and the humeral head remains located. Surgical changes of prior right total hip arthroplasty without evidence of hardware complication. Exuberant callus present inferior to the lesser trochanter. Atherosclerotic calcifications present in the bilateral common femoral arteries. Multilevel degenerative disc disease visualized in the lumbar spine. The visualized bowel gas pattern is unremarkable. IMPRESSION: 1. Displaced fracture of the left superior pubic ramus. 2. Minimally  displaced fracture of the left inferior pubic ramus. 3. Surgical changes of prior right total hip arthroplasty without evidence of hardware complication. 4. The bones appear diffusely demineralized. 5. Lower lumbar degenerative disc disease. Electronically Signed   By: Jacqulynn Cadet M.D.   On: 11/01/2016 10:11    Medications:  Prior to Admission:  Prescriptions Prior to Admission  Medication Sig Dispense Refill Last Dose  . allopurinol (ZYLOPRIM) 300 MG tablet Take 300 mg by mouth daily with supper.    10/31/2016 at Unknown time  . furosemide (LASIX) 40 MG tablet TAKE 1/2 TABLET BY MOUTH DAILY. 45 tablet 2 10/31/2016 at Unknown time  . lisinopril (PRINIVIL,ZESTRIL) 40 MG tablet TAKE ONE TABLET BY MOUTH DAILY. 30 tablet 11 10/31/2016 at Unknown time  . metoprolol (LOPRESSOR) 50 MG tablet TAKE (1) TABLET BY MOUTH TWICE DAILY. 60 tablet 11 10/31/2016 at 2100  . Multiple Vitamin (MULTIVITAMIN) tablet Take 1 tablet by mouth daily.     10/31/2016 at Unknown time  . Multiple Vitamins-Minerals (PRESERVISION AREDS PO) Take 1 tablet by mouth 2 (two) times daily.    10/31/2016 at Unknown time  . pantoprazole (PROTONIX) 40 MG tablet Take 40 mg by mouth daily.   10/31/2016 at Unknown time  . polyethylene glycol powder (GLYCOLAX/MIRALAX) powder Take 17 g by mouth at bedtime.  0 10/31/2016 at Unknown time  . potassium chloride (K-DUR) 10 MEQ tablet TAKE ONE TABLET BY MOUTH DAILY. 30 tablet 11 10/31/2016 at Unknown time  . simvastatin (ZOCOR) 40 MG tablet TAKE (1) TABLET BY MOUTH AT BEDTIME FOR  CHOLESTEROL. 30 tablet 0 10/31/2016 at Unknown time  . tamsulosin (FLOMAX) 0.4 MG CAPS capsule Take 0.4 mg by mouth daily.  11 10/31/2016 at Unknown time  . warfarin (COUMADIN) 2.5 MG tablet Take 1.25-2.5 mg by mouth daily. Take 1.'25mg'$  (one-half tablet) on MWF, then take 2.'5mg'$  (one whole tablet) on all other days as directed per Coumadin Clinic   10/31/2016 at Unknown time   Scheduled: . allopurinol  300 mg Oral Q supper  . bacitracin  1  application Topical BID  . furosemide  20 mg Oral Daily  . lisinopril  40 mg Oral Daily  . metoprolol  50 mg Oral BID  . pantoprazole  40 mg Oral Daily  . polyethylene glycol  17 g Oral QHS  . potassium chloride  10 mEq Oral Daily  . simvastatin  40 mg Oral q1800  . sodium chloride flush  3 mL Intravenous Q12H  . tamsulosin  0.4 mg Oral Daily  . Warfarin - Pharmacist Dosing Inpatient   Does not apply Q24H   Continuous: . sodium chloride    . azithromycin Stopped (11/02/16 1719)  . cefTRIAXone (ROCEPHIN)  IV Stopped (11/02/16 1618)   WQV:LDKCCQ chloride, acetaminophen **OR** acetaminophen, HYDROcodone-acetaminophen, morphine, ondansetron **OR** ondansetron (ZOFRAN) IV, sodium chloride flush  Assesment: He has pelvic fracture. He is going to need rehabilitation after that.  He has chronic atrial fib and he is chronically anticoagulated. This morning his INR is elevated  He has pneumonia which may be from aspiration. He is being treated.  He has chronic kidney disease which is stable.  He has some element of COPD.  He has acute hypoxic respiratory failure and is requiring oxygen Principal Problem:   Pelvic fracture (HCC) Active Problems:   Atrial fibrillation, chronic (HCC)   Hypertension   CKD (chronic kidney disease), stage III   Hyperlipidemia   Pressure injury of skin   Pelvis fracture ([place])    Plan: Continue treatments.    LOS: 1 day   Keina Mutch L 11/03/2016, 8:52 AM

## 2016-11-03 NOTE — Discharge Summary (Signed)
Physician Discharge Summary  Patient ID: James [name] MRN: [id number] DOB/AGE: [birth year] 83 y.o. Primary Care Physician:Declan Adamson, Ramon Dredge, MD Admit date: 11/01/2016 Discharge date: 11/03/2016    Discharge Diagnoses:   Principal Problem:   Pelvic fracture [facility]) Active Problems:   Atrial fibrillation, chronic (HCC)   Hypertension   CKD (chronic kidney disease), stage III   Hyperlipidemia   Pressure injury of skin   Pelvis fracture (HCC)   CAP (community acquired pneumonia)   Allergies as of 11/03/2016      Reactions   No Known Allergies       Medication List    TAKE these medications   allopurinol 300 MG tablet Commonly known as:  ZYLOPRIM Take 300 mg by mouth daily with supper.   amoxicillin-clavulanate 875-125 MG tablet Commonly known as:  AUGMENTIN Take 1 tablet by mouth 2 (two) times daily.   furosemide 40 MG tablet Commonly known as:  LASIX TAKE 1/2 TABLET BY MOUTH DAILY.   HYDROcodone-acetaminophen 5-325 MG tablet Commonly known as:  NORCO/VICODIN Take 1-2 tablets by mouth every 4 (four) hours as needed for moderate pain.   lisinopril 40 MG tablet Commonly known as:  PRINIVIL,ZESTRIL TAKE ONE TABLET BY MOUTH DAILY.   metoprolol 50 MG tablet Commonly known as:  LOPRESSOR TAKE (1) TABLET BY MOUTH TWICE DAILY.   multivitamin tablet Take 1 tablet by mouth daily.   pantoprazole 40 MG tablet Commonly known as:  PROTONIX Take 40 mg by mouth daily.   polyethylene glycol powder powder Commonly known as:  GLYCOLAX/MIRALAX Take 17 g by mouth at bedtime.   potassium chloride 10 MEQ tablet Commonly known as:  K-DUR TAKE ONE TABLET BY MOUTH DAILY.   PRESERVISION AREDS PO Take 1 tablet by mouth 2 (two) times daily.   simvastatin 40 MG tablet Commonly known as:  ZOCOR TAKE (1) TABLET BY MOUTH AT BEDTIME FOR CHOLESTEROL.   tamsulosin 0.4 MG Caps capsule Commonly known as:  FLOMAX Take 0.4 mg by mouth daily.   warfarin 2.5 MG tablet Commonly known as:   COUMADIN Take 1.25-2.5 mg by mouth daily. Take 1.25mg  (one-half tablet) on MWF, then take 2.5mg  (one whole tablet) on all other days as directed per [facility]       Discharged Condition:Improved    Consults: Orthopedics, Dr. Romeo Apple  Significant Diagnostic Studies: Dg Chest 1 View  Result Date: 11/01/2016 CLINICAL DATA:  Hip fracture. EXAM: CHEST 1 VIEW COMPARISON:  Chest x-ray a 12/18/2015 FINDINGS: The heart is enlarged but overall stable given the AP projection and portable technique. There is tortuosity, ectasia and calcification of the thoracic aorta. Right lower lobe airspace opacity worrisome for infiltrate. Some of this could be layering pleural fluid also. An upright two view chest x-ray may be helpful for further evaluation. IMPRESSION: Right lower lobe airspace process and possible pleural fluid. Upright two view chest x-ray may be helpful for further evaluation but findings suspicious for pneumonia. Electronically Signed   By: Rudie Meyer M.D.   On: 11/01/2016 11:37   Dg Elbow Complete Left  Result Date: 11/01/2016 CLINICAL DATA:  Fall last night.  Scan laceration on elbow EXAM: LEFT ELBOW - COMPLETE 3+ VIEW COMPARISON:  None. FINDINGS: No visible fracture, subluxation or dislocation. No visible joint effusion although the lateral view is suboptimal due to positioning. Vascular calcifications noted. IMPRESSION: No acute bony abnormality visualized. Electronically Signed   By: Charlett Nose M.D.   On: 11/01/2016 10:10   Dg Hip Unilat W Or Wo Pelvis 2-3  Views Left  Result Date: 11/01/2016 CLINICAL DATA:  83 year old male with left hip pain after falling last night EXAM: DG HIP (WITH OR WITHOUT PELVIS) 2-3V LEFT COMPARISON:  Concurrently obtained radiographs of the left elbow FINDINGS: The bones appear diffusely demineralized. There is a displaced fracture through the left superior pubic ramus and a minimally displaced fracture through the inferior pubic ramus. The left proximal  femur remains intact and the humeral head remains located. Surgical changes of prior right total hip arthroplasty without evidence of hardware complication. Exuberant callus present inferior to the lesser trochanter. Atherosclerotic calcifications present in the bilateral common femoral arteries. Multilevel degenerative disc disease visualized in the lumbar spine. The visualized bowel gas pattern is unremarkable. IMPRESSION: 1. Displaced fracture of the left superior pubic ramus. 2. Minimally displaced fracture of the left inferior pubic ramus. 3. Surgical changes of prior right total hip arthroplasty without evidence of hardware complication. 4. The bones appear diffusely demineralized. 5. Lower lumbar degenerative disc disease. Electronically Signed   By: Malachy Moan M.D.   On: 11/01/2016 10:11    Lab Results: Basic Metabolic Panel:  Recent Labs  16/10/96 0919 11/02/16 0452  NA 138 136  K 3.4* 3.3*  CL 102 101  CO2 26 27  GLUCOSE 136* 123*  BUN 15 17  CREATININE 1.16 1.21  CALCIUM 9.0 8.7*   Liver Function Tests:  Recent Labs  11/01/16 0919  AST 40  ALT 24  ALKPHOS 124  BILITOT 1.9*  PROT 6.6  ALBUMIN 3.2*     CBC:  Recent Labs  11/01/16 0919 11/02/16 0452  WBC 10.5 10.1  NEUTROABS 8.4*  --   HGB 12.5* 11.4*  HCT 38.1* 34.0*  MCV 95.7 95.8  PLT 118* 100*    No results found for this or any previous visit (from the past 240 hour(s)).   Hospital Course: This is a 83 year old who fell at home. He initially did not complain of any pain so he was felt not to have had an injury. He later started complaining of pain and was not able to stand and he was brought to the emergency department for evaluation. He was found to have a pelvic fracture. He also had a pneumonia that was not immediately recognized on admission but was started on intravenous antibiotics about 20 hours into his hospitalization. He required oxygen which she has not used at home. He had PT evaluation  it was felt that he was going to require rehabilitation in a skilled care facility. This is being arranged.  Discharge Exam: Blood pressure (!) 155/84, pulse 73, temperature 97.9 F (36.6 C), temperature source Oral, resp. rate 20, height 5\' 6"  (1.676 m), weight 88.5 kg (195 lb), SpO2 100 %. He has atrial fib. His chest is relatively clear. He is wearing oxygen. He has minimal tenderness in the left groin area  Disposition: To skilled care facility for rehabilitation. He will need PT OT and speech as needed. He is on Coumadin and this is managed by the Coumadin clinic at [facility]. He will need PT/INR tomorrow called to the Coumadin clinic. He will be on a heart healthy diet. He will be on oxygen at 2 L and this can later be discontinued if his oxygen saturation remains greater than 90% on room air. He will be on Augmentin 875 mg twice a day for 7 days for the pneumonia. He will need a chest x-ray in 1 month to document clearing.      Signed: Madell Heino L  11/03/2016, 8:57 AM

## 2016-11-03 NOTE — Telephone Encounter (Signed)
Orders faxed to [facility].

## 2016-11-04 ENCOUNTER — Ambulatory Visit (INDEPENDENT_AMBULATORY_CARE_PROVIDER_SITE_OTHER): Payer: Medicare Other | Admitting: *Deleted

## 2016-11-04 ENCOUNTER — Encounter (HOSPITAL_COMMUNITY)
Admission: RE | Admit: 2016-11-04 | Discharge: 2016-11-04 | Disposition: A | Discharge status: Home / Self Care | Payer: Medicare Other | Source: Skilled Nursing Facility | Attending: Pulmonary Disease | Admitting: Pulmonary Disease

## 2016-11-04 DIAGNOSIS — Z96641 Presence of right artificial hip joint: Secondary | ICD-10-CM | POA: Insufficient documentation

## 2016-11-04 DIAGNOSIS — Z5181 Encounter for therapeutic drug level monitoring: Secondary | ICD-10-CM

## 2016-11-04 DIAGNOSIS — I482 Chronic atrial fibrillation, unspecified: Secondary | ICD-10-CM

## 2016-11-04 DIAGNOSIS — Z471 Aftercare following joint replacement surgery: Secondary | ICD-10-CM | POA: Insufficient documentation

## 2016-11-04 DIAGNOSIS — Z96652 Presence of left artificial knee joint: Secondary | ICD-10-CM | POA: Insufficient documentation

## 2016-11-04 DIAGNOSIS — Z96651 Presence of right artificial knee joint: Secondary | ICD-10-CM | POA: Insufficient documentation

## 2016-11-04 DIAGNOSIS — Z7901 Long term (current) use of anticoagulants: Secondary | ICD-10-CM | POA: Insufficient documentation

## 2016-11-04 LAB — PROTIME-INR
INR: 3.3
PROTHROMBIN TIME: 34.3 s — AB (ref 11.4–15.2)

## 2016-11-07 NOTE — H&P (Addendum)
James [name] MRN: [id number] DOB/AGE: Dec 13, 1921 83 y.o. Primary Care Physician:Vernor Monnig, Ramon Dredge, MD Admit date: 11/03/2016 Chief Complaint: For rehabilitation HPI: This is a history and physical at the skilled care facility. He was admitted to the inpatient hospital with a pelvic fracture. He was also noted to have pneumonia. He had hypoxia and is still on oxygen. He had not been on oxygen before. He says he is not having any pain until he's tries to move around. He is still coughing. He has no other new complaints. He has multiple other medical problems including chronic atrial fib, previous stroke, chronic anticoagulation, reflux, hypertension, heart failure generalized weakness previous knee replacement requiring revision and pulmonary embolism. He says he feels fairly well. He is not having any chest pain. He's not coughing up any sputum. He is somewhat short of breath. No rapid atrial fib. No nausea vomiting diarrhea. No difficulty urinating. No PND or orthopnea  I reviewed medications in the [facility] which correspond to the medications I had had him on at discharge. I reviewed the events in the hospital and my discharge summary  Past Medical History:  Diagnosis Date  . Anemia    H&H of 12.4/38.5 with MCV of 110 in /2012  . Arthritis   . Atrial fibrillation, chronic (HCC)    Permanent atrial fibrillation  . Cerebrovascular disease    Left posterior parietal infarction  . Chronic anticoagulation    H&H of 12.4/38.5 in 05/2011; negative stool Hemoccults in 05/2009  . Deep vein thrombosis (DVT) (HCC)   . Dysrhythmia   . Fasting hyperglycemia    Glucose values of 70-135 in 2010-2012  . GERD (gastroesophageal reflux disease)   . Herpes zoster 2011  . History of kidney stones   . Hyperlipidemia    Lipid profile in 05/2011:138, 120, 34, 80  . Hypertension    Lab 05/2011: Normal CMet except glucose of 121 and creatinine of 1.37  . Pulmonary embolism (HCC)   . Stroke (HCC)   . TOTAL KNEE  REVISIONLeft    Past Surgical History:  Procedure Laterality Date  . APPENDECTOMY    . CHOLECYSTECTOMY    . COLONOSCOPY    . ESOPHAGEAL DILATION N/A 07/30/2016   Procedure: ESOPHAGEAL DILATION;  Surgeon: Malissa Hippo, MD;  Location: [facility];  Service: Endoscopy;  Laterality: N/A;  . ESOPHAGOGASTRODUODENOSCOPY N/A 07/30/2016   Procedure: ESOPHAGOGASTRODUODENOSCOPY (EGD);  Surgeon: Malissa Hippo, MD;  Location: [facility];  Service: Endoscopy;  Laterality: N/A;  730  . EYE SURGERY Bilateral   . HEMORRHOID SURGERY    . HIP FRACTURE SURGERY    . JOINT REPLACEMENT    . RENAL ARTERY STENT    . TOTAL KNEE REVISION Left 03/18/2015   Procedure: TOTAL KNEE REVISION;  Surgeon: Valeria Batman, MD;  Location: [facility] OR;  Service: Orthopedics;  Laterality: Left;  . WISDOM TOOTH EXTRACTION          Family History  Problem Relation Age of Onset  . Cancer Mother    He had a brother with Down's syndrome and cardiac disease from that and there is a family history of COPD Social History:  reports that he has never smoked. He has never used smokeless tobacco. He reports that he does not drink alcohol or use drugs. He is married and lives at home with his wife  Allergies:  Allergies  Allergen Reactions  . No Known Allergies     Medications Prior to Admission  Medication Sig Dispense Refill  .  allopurinol (ZYLOPRIM) 300 MG tablet Take 300 mg by mouth daily with supper.     Marland Kitchen amoxicillin-clavulanate (AUGMENTIN) 875-125 MG tablet Take 1 tablet by mouth 2 (two) times daily. 14 tablet 0  . furosemide (LASIX) 40 MG tablet TAKE 1/2 TABLET BY MOUTH DAILY. 45 tablet 2  . HYDROcodone-acetaminophen (NORCO/VICODIN) 5-325 MG tablet Take 1-2 tablets by mouth every 4 (four) hours as needed for moderate pain. 30 tablet 0  . lisinopril (PRINIVIL,ZESTRIL) 40 MG tablet TAKE ONE TABLET BY MOUTH DAILY. 30 tablet 11  . metoprolol (LOPRESSOR) 50 MG tablet TAKE (1) TABLET BY MOUTH TWICE DAILY. 60 tablet 11   . Multiple Vitamin (MULTIVITAMIN) tablet Take 1 tablet by mouth daily.      . Multiple Vitamins-Minerals (PRESERVISION AREDS PO) Take 1 tablet by mouth 2 (two) times daily.     . pantoprazole (PROTONIX) 40 MG tablet Take 40 mg by mouth daily.    . polyethylene glycol powder (GLYCOLAX/MIRALAX) powder Take 17 g by mouth at bedtime.  0  . potassium chloride (K-DUR) 10 MEQ tablet TAKE ONE TABLET BY MOUTH DAILY. 30 tablet 11  . simvastatin (ZOCOR) 40 MG tablet TAKE (1) TABLET BY MOUTH AT BEDTIME FOR CHOLESTEROL. 30 tablet 0  . tamsulosin (FLOMAX) 0.4 MG CAPS capsule Take 0.4 mg by mouth daily.  11  . warfarin (COUMADIN) 2.5 MG tablet Take 1.25-2.5 mg by mouth daily. Take 1.25mg  (one-half tablet) on MWF, then take 2.5mg  (one whole tablet) on all other days as directed per [facility]         ZOX:WRUEA from the symptoms mentioned above,there are no other symptoms referable to all systems reviewed.10 point review of systems otherwise is negative  Physical Exam: There were no vitals taken for this visit. Constitutional: He is awake and alert. He is lying quietly in the bed. He is wearing nasal oxygen. His wife is at bedside. Eyes: Pupils react. EOMI. Ears nose mouth and throat: His mucous membranes are moist. He is somewhat hard of hearing. Cardiovascular: His heart is not regular and shows atrial fib. He has 1+ edema of his legs. Respiratory: He has crackles in the right base but his respiratory effort is normal. Gastrointestinal: His abdomen is soft with no masses. Bowel sounds are present musculoskeletal: He has normal strength and he is mildly tender in the area of the left pelvis. Neurological: No focal abnormalities. Psychiatric: Normal mood and affect. Skin: Warm and dry   No results for input(s): WBC, NEUTROABS, HGB, HCT, MCV, PLT in the last 72 hours. No results for input(s): NA, K, CL, CO2, GLUCOSE, BUN, CREATININE, CALCIUM, MG in the last 72 hours.  Invalid input(s):  PHOlablast2(ast:2,ALT:2,alkphos:2,bilitot:2,prot:2,albumin:2)@    No results found for this or any previous visit (from the past 240 hour(s)).   Dg Chest 1 View  Result Date: 11/01/2016 CLINICAL DATA:  Hip fracture. EXAM: CHEST 1 VIEW COMPARISON:  Chest x-ray a 12/18/2015 FINDINGS: The heart is enlarged but overall stable given the AP projection and portable technique. There is tortuosity, ectasia and calcification of the thoracic aorta. Right lower lobe airspace opacity worrisome for infiltrate. Some of this could be layering pleural fluid also. An upright two view chest x-ray may be helpful for further evaluation. IMPRESSION: Right lower lobe airspace process and possible pleural fluid. Upright two view chest x-ray may be helpful for further evaluation but findings suspicious for pneumonia. Electronically Signed   By: Rudie Meyer M.D.   On: 11/01/2016 11:37   Dg Elbow Complete Left  Result Date: 11/01/2016 CLINICAL DATA:  Fall last night.  Scan laceration on elbow EXAM: LEFT ELBOW - COMPLETE 3+ VIEW COMPARISON:  None. FINDINGS: No visible fracture, subluxation or dislocation. No visible joint effusion although the lateral view is suboptimal due to positioning. Vascular calcifications noted. IMPRESSION: No acute bony abnormality visualized. Electronically Signed   By: Charlett NoseKevin  Dover M.D.   On: 11/01/2016 10:10   Dg Hip Unilat W Or Wo Pelvis 2-3 Views Left  Result Date: 11/01/2016 CLINICAL DATA:  83 year old male with left hip pain after falling last night EXAM: DG HIP (WITH OR WITHOUT PELVIS) 2-3V LEFT COMPARISON:  Concurrently obtained radiographs of the left elbow FINDINGS: The bones appear diffusely demineralized. There is a displaced fracture through the left superior pubic ramus and a minimally displaced fracture through the inferior pubic ramus. The left proximal femur remains intact and the humeral head remains located. Surgical changes of prior right total hip arthroplasty without evidence of  hardware complication. Exuberant callus present inferior to the lesser trochanter. Atherosclerotic calcifications present in the bilateral common femoral arteries. Multilevel degenerative disc disease visualized in the lumbar spine. The visualized bowel gas pattern is unremarkable. IMPRESSION: 1. Displaced fracture of the left superior pubic ramus. 2. Minimally displaced fracture of the left inferior pubic ramus. 3. Surgical changes of prior right total hip arthroplasty without evidence of hardware complication. 4. The bones appear diffusely demineralized. 5. Lower lumbar degenerative disc disease. Electronically Signed   By: Malachy MoanHeath  McCullough M.D.   On: 11/01/2016 10:11   Impression: He has pelvis fracture. This is not a surgical lesion. He is undergoing rehabilitation for that.  He has pneumonia which is being treated. That will need follow-up chest x-ray  He has hypoxic respiratory failure probably related to his pneumonia and we can get him off the oxygen I think eventually  He has heart failure was not totally compliant with his medications at home and of course is doing better now in the skilled care facility  He has chronic atrial fib and is on Coumadin which is managed at the Coumadin clinic. His atrial fib is well controlled  He has hypertension which is doing okay  He has chronic kidney disease which is stable  He has personal history of stroke but he is asymptomatic from that now  He has personal history of pulmonary emboli but that is asymptomatic now  He has poor general conditioning and that is being treated with physical therapy etc. Active Problems:   * No active hospital problems. *     Plan: Continue with PT continue other treatments we can assess his oxygenation once he finishes the antibiotic      Tommy Goostree L   11/07/2016, 9:49 AM

## 2016-11-08 ENCOUNTER — Ambulatory Visit (INDEPENDENT_AMBULATORY_CARE_PROVIDER_SITE_OTHER): Payer: Medicare Other | Admitting: *Deleted

## 2016-11-08 ENCOUNTER — Encounter (HOSPITAL_COMMUNITY)
Admission: RE | Admit: 2016-11-08 | Discharge: 2016-11-08 | Disposition: A | Discharge status: Home / Self Care | Payer: Medicare Other | Source: Skilled Nursing Facility | Attending: Pulmonary Disease | Admitting: Pulmonary Disease

## 2016-11-08 DIAGNOSIS — Z96651 Presence of right artificial knee joint: Secondary | ICD-10-CM | POA: Diagnosis present

## 2016-11-08 DIAGNOSIS — Z7901 Long term (current) use of anticoagulants: Secondary | ICD-10-CM | POA: Diagnosis present

## 2016-11-08 DIAGNOSIS — I482 Chronic atrial fibrillation, unspecified: Secondary | ICD-10-CM

## 2016-11-08 DIAGNOSIS — Z96652 Presence of left artificial knee joint: Secondary | ICD-10-CM | POA: Diagnosis present

## 2016-11-08 DIAGNOSIS — Z471 Aftercare following joint replacement surgery: Secondary | ICD-10-CM | POA: Insufficient documentation

## 2016-11-08 DIAGNOSIS — Z96641 Presence of right artificial hip joint: Secondary | ICD-10-CM | POA: Diagnosis present

## 2016-11-08 DIAGNOSIS — Z5181 Encounter for therapeutic drug level monitoring: Secondary | ICD-10-CM

## 2016-11-08 LAB — PROTIME-INR
INR: 2.7
PROTHROMBIN TIME: 29.2 s — AB (ref 11.4–15.2)

## 2016-11-08 LAB — POCT INR: INR: 2.7

## 2016-11-11 ENCOUNTER — Encounter (HOSPITAL_COMMUNITY)
Admission: RE | Admit: 2016-11-11 | Discharge: 2016-11-11 | Disposition: A | Discharge status: Home / Self Care | Payer: Medicare Other | Source: Skilled Nursing Facility | Attending: Pulmonary Disease | Admitting: Pulmonary Disease

## 2016-11-11 LAB — PROTIME-INR
INR: 2.41
PROTHROMBIN TIME: 26.7 s — AB (ref 11.4–15.2)

## 2016-11-12 ENCOUNTER — Ambulatory Visit (INDEPENDENT_AMBULATORY_CARE_PROVIDER_SITE_OTHER): Payer: Medicare Other | Admitting: Cardiology

## 2016-11-12 DIAGNOSIS — I482 Chronic atrial fibrillation, unspecified: Secondary | ICD-10-CM

## 2016-11-12 DIAGNOSIS — Z5181 Encounter for therapeutic drug level monitoring: Secondary | ICD-10-CM

## 2016-11-18 ENCOUNTER — Ambulatory Visit (INDEPENDENT_AMBULATORY_CARE_PROVIDER_SITE_OTHER): Payer: Medicare Other | Admitting: *Deleted

## 2016-11-18 ENCOUNTER — Encounter (HOSPITAL_COMMUNITY)
Admission: RE | Admit: 2016-11-18 | Discharge: 2016-11-18 | Disposition: A | Discharge status: Home / Self Care | Payer: Medicare Other | Source: Skilled Nursing Facility | Attending: Pulmonary Disease | Admitting: Pulmonary Disease

## 2016-11-18 DIAGNOSIS — I482 Chronic atrial fibrillation, unspecified: Secondary | ICD-10-CM

## 2016-11-18 DIAGNOSIS — Z5181 Encounter for therapeutic drug level monitoring: Secondary | ICD-10-CM

## 2016-11-18 LAB — PROTIME-INR
INR: 2.03
PROTHROMBIN TIME: 23.2 s — AB (ref 11.4–15.2)

## 2016-11-25 ENCOUNTER — Ambulatory Visit (INDEPENDENT_AMBULATORY_CARE_PROVIDER_SITE_OTHER): Payer: Medicare Other | Admitting: *Deleted

## 2016-11-25 ENCOUNTER — Encounter (HOSPITAL_COMMUNITY)
Admission: RE | Admit: 2016-11-25 | Discharge: 2016-11-25 | Disposition: A | Discharge status: Home / Self Care | Payer: Medicare Other | Source: Skilled Nursing Facility | Attending: Pulmonary Disease | Admitting: Pulmonary Disease

## 2016-11-25 DIAGNOSIS — Z5181 Encounter for therapeutic drug level monitoring: Secondary | ICD-10-CM

## 2016-11-25 DIAGNOSIS — I482 Chronic atrial fibrillation, unspecified: Secondary | ICD-10-CM

## 2016-11-25 LAB — BASIC METABOLIC PANEL
Anion gap: 8 (ref 5–15)
BUN: 22 mg/dL — ABNORMAL HIGH (ref 6–20)
CALCIUM: 8.8 mg/dL — AB (ref 8.9–10.3)
CO2: 27 mmol/L (ref 22–32)
CREATININE: 0.89 mg/dL (ref 0.61–1.24)
Chloride: 100 mmol/L — ABNORMAL LOW (ref 101–111)
Glucose, Bld: 116 mg/dL — ABNORMAL HIGH (ref 65–99)
Potassium: 3.8 mmol/L (ref 3.5–5.1)
SODIUM: 135 mmol/L (ref 135–145)

## 2016-11-25 LAB — PROTIME-INR
INR: 1.98
PROTHROMBIN TIME: 22.8 s — AB (ref 11.4–15.2)

## 2016-12-02 ENCOUNTER — Encounter (HOSPITAL_COMMUNITY)
Admission: RE | Admit: 2016-12-02 | Discharge: 2016-12-02 | Disposition: A | Discharge status: Home / Self Care | Payer: Medicare Other | Source: Skilled Nursing Facility | Attending: Pulmonary Disease | Admitting: Pulmonary Disease

## 2016-12-02 LAB — PROTIME-INR
INR: 2.43
Prothrombin Time: 26.9 seconds — ABNORMAL HIGH (ref 11.4–15.2)

## 2016-12-03 ENCOUNTER — Ambulatory Visit (INDEPENDENT_AMBULATORY_CARE_PROVIDER_SITE_OTHER): Payer: Medicare Other | Admitting: Cardiovascular Disease

## 2016-12-03 DIAGNOSIS — I482 Chronic atrial fibrillation, unspecified: Secondary | ICD-10-CM

## 2016-12-03 DIAGNOSIS — Z5181 Encounter for therapeutic drug level monitoring: Secondary | ICD-10-CM

## 2016-12-04 ENCOUNTER — Other Ambulatory Visit: Payer: Self-pay | Admitting: Cardiovascular Disease

## 2016-12-09 ENCOUNTER — Ambulatory Visit (INDEPENDENT_AMBULATORY_CARE_PROVIDER_SITE_OTHER): Payer: Medicare Other | Admitting: Cardiology

## 2016-12-09 DIAGNOSIS — I482 Chronic atrial fibrillation, unspecified: Secondary | ICD-10-CM

## 2016-12-09 DIAGNOSIS — Z5181 Encounter for therapeutic drug level monitoring: Secondary | ICD-10-CM

## 2016-12-09 LAB — POCT INR: INR: 2

## 2016-12-16 ENCOUNTER — Ambulatory Visit (INDEPENDENT_AMBULATORY_CARE_PROVIDER_SITE_OTHER): Payer: Medicare Other | Admitting: *Deleted

## 2016-12-16 DIAGNOSIS — I482 Chronic atrial fibrillation, unspecified: Secondary | ICD-10-CM

## 2016-12-16 DIAGNOSIS — Z5181 Encounter for therapeutic drug level monitoring: Secondary | ICD-10-CM

## 2016-12-16 LAB — POCT INR: INR: 2.8

## 2016-12-23 ENCOUNTER — Ambulatory Visit (INDEPENDENT_AMBULATORY_CARE_PROVIDER_SITE_OTHER): Payer: Medicare Other | Admitting: *Deleted

## 2016-12-23 DIAGNOSIS — I482 Chronic atrial fibrillation, unspecified: Secondary | ICD-10-CM

## 2016-12-23 DIAGNOSIS — Z5181 Encounter for therapeutic drug level monitoring: Secondary | ICD-10-CM

## 2016-12-23 LAB — POCT INR: INR: 5.8

## 2016-12-28 ENCOUNTER — Ambulatory Visit (INDEPENDENT_AMBULATORY_CARE_PROVIDER_SITE_OTHER): Payer: Self-pay | Admitting: Cardiology

## 2016-12-28 ENCOUNTER — Telehealth: Payer: Self-pay | Admitting: Cardiovascular Disease

## 2016-12-28 DIAGNOSIS — I482 Chronic atrial fibrillation, unspecified: Secondary | ICD-10-CM

## 2016-12-28 DIAGNOSIS — Z5181 Encounter for therapeutic drug level monitoring: Secondary | ICD-10-CM

## 2016-12-28 LAB — POCT INR: INR: 3

## 2016-12-28 NOTE — Telephone Encounter (Signed)
See coumadin encounter of this date 

## 2016-12-28 NOTE — Telephone Encounter (Signed)
Okey RegalCarol from [facility]   [phone]  INR 3.0 PT   36.9

## 2017-01-03 ENCOUNTER — Other Ambulatory Visit: Payer: Self-pay | Admitting: Cardiovascular Disease

## 2017-01-04 ENCOUNTER — Ambulatory Visit (INDEPENDENT_AMBULATORY_CARE_PROVIDER_SITE_OTHER): Payer: Medicare Other | Admitting: *Deleted

## 2017-01-04 DIAGNOSIS — I482 Chronic atrial fibrillation, unspecified: Secondary | ICD-10-CM

## 2017-01-04 DIAGNOSIS — Z5181 Encounter for therapeutic drug level monitoring: Secondary | ICD-10-CM

## 2017-01-04 LAB — POCT INR: INR: 5.5

## 2017-01-06 ENCOUNTER — Ambulatory Visit (INDEPENDENT_AMBULATORY_CARE_PROVIDER_SITE_OTHER): Payer: Medicare Other | Admitting: *Deleted

## 2017-01-06 DIAGNOSIS — I482 Chronic atrial fibrillation, unspecified: Secondary | ICD-10-CM

## 2017-01-06 DIAGNOSIS — Z5181 Encounter for therapeutic drug level monitoring: Secondary | ICD-10-CM

## 2017-01-06 LAB — POCT INR: INR: 4.8

## 2017-01-10 ENCOUNTER — Telehealth: Payer: Self-pay | Admitting: *Deleted

## 2017-01-10 ENCOUNTER — Ambulatory Visit (INDEPENDENT_AMBULATORY_CARE_PROVIDER_SITE_OTHER): Payer: Medicare Other | Admitting: *Deleted

## 2017-01-10 DIAGNOSIS — Z5181 Encounter for therapeutic drug level monitoring: Secondary | ICD-10-CM

## 2017-01-10 DIAGNOSIS — I482 Chronic atrial fibrillation, unspecified: Secondary | ICD-10-CM

## 2017-01-10 LAB — POCT INR: INR: 2.9

## 2017-01-10 NOTE — Telephone Encounter (Signed)
2.9 per Maralyn SagoSarah [phone]

## 2017-01-10 NOTE — Telephone Encounter (Signed)
Done.  See coumadin note. 

## 2017-01-17 ENCOUNTER — Ambulatory Visit (INDEPENDENT_AMBULATORY_CARE_PROVIDER_SITE_OTHER): Payer: Medicare Other | Admitting: *Deleted

## 2017-01-17 DIAGNOSIS — Z5181 Encounter for therapeutic drug level monitoring: Secondary | ICD-10-CM

## 2017-01-17 DIAGNOSIS — I482 Chronic atrial fibrillation, unspecified: Secondary | ICD-10-CM

## 2017-01-17 LAB — POCT INR: INR: 3.4

## 2017-01-26 ENCOUNTER — Ambulatory Visit (INDEPENDENT_AMBULATORY_CARE_PROVIDER_SITE_OTHER): Payer: Medicare Other | Admitting: *Deleted

## 2017-01-26 DIAGNOSIS — I482 Chronic atrial fibrillation, unspecified: Secondary | ICD-10-CM

## 2017-01-26 DIAGNOSIS — Z5181 Encounter for therapeutic drug level monitoring: Secondary | ICD-10-CM

## 2017-01-26 LAB — POCT INR: INR: 2.7

## 2017-01-31 ENCOUNTER — Telehealth: Payer: Self-pay | Admitting: *Deleted

## 2017-01-31 ENCOUNTER — Ambulatory Visit (INDEPENDENT_AMBULATORY_CARE_PROVIDER_SITE_OTHER): Payer: Medicare Other | Admitting: *Deleted

## 2017-01-31 DIAGNOSIS — I482 Chronic atrial fibrillation, unspecified: Secondary | ICD-10-CM

## 2017-01-31 DIAGNOSIS — Z5181 Encounter for therapeutic drug level monitoring: Secondary | ICD-10-CM

## 2017-01-31 LAB — POCT INR: INR: 2.7

## 2017-01-31 NOTE — Telephone Encounter (Signed)
INR - 2.7 / please call w/ instructions / tg

## 2017-01-31 NOTE — Telephone Encounter (Signed)
Done.  See coumadin note. 

## 2017-02-07 ENCOUNTER — Ambulatory Visit (INDEPENDENT_AMBULATORY_CARE_PROVIDER_SITE_OTHER): Payer: Medicare Other | Admitting: *Deleted

## 2017-02-07 DIAGNOSIS — Z5181 Encounter for therapeutic drug level monitoring: Secondary | ICD-10-CM

## 2017-02-07 DIAGNOSIS — I482 Chronic atrial fibrillation, unspecified: Secondary | ICD-10-CM

## 2017-02-07 LAB — POCT INR: INR: 1.7

## 2017-02-16 LAB — POCT INR: INR: 2.4

## 2017-02-18 ENCOUNTER — Ambulatory Visit (INDEPENDENT_AMBULATORY_CARE_PROVIDER_SITE_OTHER): Payer: Medicare Other | Admitting: Interventional Cardiology

## 2017-02-18 DIAGNOSIS — I482 Chronic atrial fibrillation, unspecified: Secondary | ICD-10-CM

## 2017-02-18 DIAGNOSIS — Z5181 Encounter for therapeutic drug level monitoring: Secondary | ICD-10-CM

## 2017-03-03 ENCOUNTER — Ambulatory Visit (INDEPENDENT_AMBULATORY_CARE_PROVIDER_SITE_OTHER): Payer: Medicare Other | Admitting: *Deleted

## 2017-03-03 ENCOUNTER — Telehealth: Payer: Self-pay | Admitting: Cardiovascular Disease

## 2017-03-03 DIAGNOSIS — I482 Chronic atrial fibrillation, unspecified: Secondary | ICD-10-CM

## 2017-03-03 DIAGNOSIS — Z5181 Encounter for therapeutic drug level monitoring: Secondary | ICD-10-CM

## 2017-03-03 LAB — POCT INR: INR: 1.8

## 2017-03-03 NOTE — Telephone Encounter (Signed)
INR 1.8  Please leave message for Tammy if she is unable to answer with dosage and when he needs to be checked again [phone]

## 2017-03-03 NOTE — Telephone Encounter (Signed)
Done.  See coumadin note. 

## 2017-03-07 ENCOUNTER — Encounter (HOSPITAL_COMMUNITY): Payer: Self-pay

## 2017-03-07 ENCOUNTER — Inpatient Hospital Stay (HOSPITAL_COMMUNITY)
Admission: AD | Admit: 2017-03-07 | Discharge: 2017-03-11 | DRG: 464 | Disposition: A | Discharge status: Skilled Nursing Facility | Payer: Medicare Other | Source: Ambulatory Visit | Attending: Pulmonary Disease | Admitting: Pulmonary Disease

## 2017-03-07 DIAGNOSIS — I10 Essential (primary) hypertension: Secondary | ICD-10-CM | POA: Diagnosis present

## 2017-03-07 DIAGNOSIS — Z8673 Personal history of transient ischemic attack (TIA), and cerebral infarction without residual deficits: Secondary | ICD-10-CM | POA: Diagnosis not present

## 2017-03-07 DIAGNOSIS — I5032 Chronic diastolic (congestive) heart failure: Secondary | ICD-10-CM | POA: Diagnosis present

## 2017-03-07 DIAGNOSIS — M86272 Subacute osteomyelitis, left ankle and foot: Secondary | ICD-10-CM | POA: Diagnosis present

## 2017-03-07 DIAGNOSIS — K219 Gastro-esophageal reflux disease without esophagitis: Secondary | ICD-10-CM | POA: Diagnosis present

## 2017-03-07 DIAGNOSIS — Z95828 Presence of other vascular implants and grafts: Secondary | ICD-10-CM

## 2017-03-07 DIAGNOSIS — I130 Hypertensive heart and chronic kidney disease with heart failure and stage 1 through stage 4 chronic kidney disease, or unspecified chronic kidney disease: Secondary | ICD-10-CM | POA: Diagnosis present

## 2017-03-07 DIAGNOSIS — L03116 Cellulitis of left lower limb: Secondary | ICD-10-CM | POA: Diagnosis present

## 2017-03-07 DIAGNOSIS — L89152 Pressure ulcer of sacral region, stage 2: Secondary | ICD-10-CM | POA: Diagnosis present

## 2017-03-07 DIAGNOSIS — Z23 Encounter for immunization: Secondary | ICD-10-CM

## 2017-03-07 DIAGNOSIS — I482 Chronic atrial fibrillation, unspecified: Secondary | ICD-10-CM | POA: Diagnosis present

## 2017-03-07 DIAGNOSIS — Z9181 History of falling: Secondary | ICD-10-CM

## 2017-03-07 DIAGNOSIS — N183 Chronic kidney disease, stage 3 unspecified: Secondary | ICD-10-CM | POA: Diagnosis present

## 2017-03-07 DIAGNOSIS — Z96652 Presence of left artificial knee joint: Secondary | ICD-10-CM | POA: Diagnosis present

## 2017-03-07 DIAGNOSIS — L89322 Pressure ulcer of left buttock, stage 2: Secondary | ICD-10-CM | POA: Diagnosis present

## 2017-03-07 DIAGNOSIS — H9190 Unspecified hearing loss, unspecified ear: Secondary | ICD-10-CM | POA: Diagnosis present

## 2017-03-07 DIAGNOSIS — Z79899 Other long term (current) drug therapy: Secondary | ICD-10-CM

## 2017-03-07 DIAGNOSIS — I679 Cerebrovascular disease, unspecified: Secondary | ICD-10-CM | POA: Diagnosis present

## 2017-03-07 DIAGNOSIS — L97409 Non-pressure chronic ulcer of unspecified heel and midfoot with unspecified severity: Secondary | ICD-10-CM

## 2017-03-07 DIAGNOSIS — E785 Hyperlipidemia, unspecified: Secondary | ICD-10-CM | POA: Diagnosis present

## 2017-03-07 DIAGNOSIS — L8990 Pressure ulcer of unspecified site, unspecified stage: Secondary | ICD-10-CM | POA: Diagnosis present

## 2017-03-07 DIAGNOSIS — L02612 Cutaneous abscess of left foot: Secondary | ICD-10-CM | POA: Diagnosis present

## 2017-03-07 DIAGNOSIS — Z86711 Personal history of pulmonary embolism: Secondary | ICD-10-CM | POA: Diagnosis not present

## 2017-03-07 DIAGNOSIS — L89620 Pressure ulcer of left heel, unstageable: Secondary | ICD-10-CM | POA: Diagnosis present

## 2017-03-07 DIAGNOSIS — L02416 Cutaneous abscess of left lower limb: Secondary | ICD-10-CM | POA: Diagnosis present

## 2017-03-07 DIAGNOSIS — L89302 Pressure ulcer of unspecified buttock, stage 2: Secondary | ICD-10-CM | POA: Diagnosis present

## 2017-03-07 DIAGNOSIS — Z7901 Long term (current) use of anticoagulants: Secondary | ICD-10-CM

## 2017-03-07 DIAGNOSIS — Z86718 Personal history of other venous thrombosis and embolism: Secondary | ICD-10-CM

## 2017-03-07 DIAGNOSIS — L97422 Non-pressure chronic ulcer of left heel and midfoot with fat layer exposed: Secondary | ICD-10-CM | POA: Diagnosis present

## 2017-03-07 DIAGNOSIS — Z9049 Acquired absence of other specified parts of digestive tract: Secondary | ICD-10-CM

## 2017-03-07 LAB — COMPREHENSIVE METABOLIC PANEL
ALT: 30 U/L (ref 17–63)
AST: 40 U/L (ref 15–41)
Albumin: 2.9 g/dL — ABNORMAL LOW (ref 3.5–5.0)
Alkaline Phosphatase: 172 U/L — ABNORMAL HIGH (ref 38–126)
Anion gap: 9 (ref 5–15)
BUN: 19 mg/dL (ref 6–20)
CO2: 28 mmol/L (ref 22–32)
CREATININE: 0.92 mg/dL (ref 0.61–1.24)
Calcium: 8.9 mg/dL (ref 8.9–10.3)
Chloride: 98 mmol/L — ABNORMAL LOW (ref 101–111)
GFR calc Af Amer: >60 mL/min (ref >60)
Glucose, Bld: 138 mg/dL — ABNORMAL HIGH (ref 65–99)
POTASSIUM: 4.2 mmol/L (ref 3.5–5.1)
Sodium: 135 mmol/L (ref 135–145)
TOTAL PROTEIN: 6.9 g/dL (ref 6.5–8.1)
Total Bilirubin: 0.9 mg/dL (ref 0.3–1.2)

## 2017-03-07 LAB — CBC WITH DIFFERENTIAL/PLATELET
BASOS ABS: 0 10*3/uL (ref 0.0–0.1)
BASOS PCT: 1 %
EOS ABS: 0.5 10*3/uL (ref 0.0–0.7)
EOS PCT: 6 %
HCT: 37.6 % — ABNORMAL LOW (ref 39.0–52.0)
Hemoglobin: 12.2 g/dL — ABNORMAL LOW (ref 13.0–17.0)
LYMPHS PCT: 20 %
Lymphs Abs: 1.5 10*3/uL (ref 0.7–4.0)
MCH: 30.6 pg (ref 26.0–34.0)
MCHC: 32.4 g/dL (ref 30.0–36.0)
MCV: 94.2 fL (ref 78.0–100.0)
Monocytes Absolute: 1.2 10*3/uL — ABNORMAL HIGH (ref 0.1–1.0)
Monocytes Relative: 15 %
Neutro Abs: 4.5 10*3/uL (ref 1.7–7.7)
Neutrophils Relative %: 58 %
PLATELETS: 165 10*3/uL (ref 150–400)
RBC: 3.99 MIL/uL — AB (ref 4.22–5.81)
RDW: 15.1 % (ref 11.5–15.5)
WBC: 7.8 10*3/uL (ref 4.0–10.5)

## 2017-03-07 LAB — PROTIME-INR
INR: 2.44
PROTHROMBIN TIME: 26.3 s — AB (ref 11.4–15.2)

## 2017-03-07 MED ORDER — OCUVITE-LUTEIN PO CAPS
1.0000 | ORAL_CAPSULE | Freq: Two times a day (BID) | ORAL | Status: DC
  Administered 2017-03-08 – 2017-03-11 (×8): 1 via ORAL
  Filled 2017-03-07 (×8): qty 1

## 2017-03-07 MED ORDER — ONDANSETRON HCL 4 MG PO TABS: 4.0000 mg | ORAL_TABLET | Freq: Four times a day (QID) | ORAL | Status: DC | PRN

## 2017-03-07 MED ORDER — PIPERACILLIN-TAZOBACTAM 3.375 G IVPB
3.3750 g | Freq: Three times a day (TID) | INTRAVENOUS | Status: DC
  Administered 2017-03-07 – 2017-03-11 (×12): 3.375 g via INTRAVENOUS
  Filled 2017-03-07 (×11): qty 50

## 2017-03-07 MED ORDER — LISINOPRIL 10 MG PO TABS
40.0000 mg | ORAL_TABLET | Freq: Every day | ORAL | Status: DC
  Administered 2017-03-08 – 2017-03-11 (×4): 40 mg via ORAL
  Filled 2017-03-07 (×4): qty 4

## 2017-03-07 MED ORDER — ACETAMINOPHEN 650 MG RE SUPP: 650.0000 mg | Freq: Four times a day (QID) | RECTAL | Status: DC | PRN

## 2017-03-07 MED ORDER — SIMVASTATIN 20 MG PO TABS
20.0000 mg | ORAL_TABLET | Freq: Every day | ORAL | Status: DC
  Administered 2017-03-07 – 2017-03-10 (×4): 20 mg via ORAL
  Filled 2017-03-07 (×4): qty 1

## 2017-03-07 MED ORDER — VANCOMYCIN HCL IN DEXTROSE 1-5 GM/200ML-% IV SOLN
1000.0000 mg | Freq: Once | INTRAVENOUS | Status: AC
  Administered 2017-03-07: 1000 mg via INTRAVENOUS
  Filled 2017-03-07: qty 200

## 2017-03-07 MED ORDER — TAMSULOSIN HCL 0.4 MG PO CAPS
0.4000 mg | ORAL_CAPSULE | Freq: Every day | ORAL | Status: DC
  Administered 2017-03-08 – 2017-03-11 (×4): 0.4 mg via ORAL
  Filled 2017-03-07 (×4): qty 1

## 2017-03-07 MED ORDER — ONDANSETRON HCL 4 MG/2ML IJ SOLN: 4.0000 mg | Freq: Four times a day (QID) | INTRAMUSCULAR | Status: DC | PRN

## 2017-03-07 MED ORDER — FUROSEMIDE 20 MG PO TABS
20.0000 mg | ORAL_TABLET | Freq: Every day | ORAL | Status: DC
  Administered 2017-03-08 – 2017-03-11 (×4): 20 mg via ORAL
  Filled 2017-03-07 (×4): qty 1

## 2017-03-07 MED ORDER — ALLOPURINOL 300 MG PO TABS
300.0000 mg | ORAL_TABLET | Freq: Every day | ORAL | Status: DC
  Administered 2017-03-07 – 2017-03-10 (×4): 300 mg via ORAL
  Filled 2017-03-07 (×4): qty 1

## 2017-03-07 MED ORDER — POTASSIUM CHLORIDE CRYS ER 10 MEQ PO TBCR
10.0000 meq | EXTENDED_RELEASE_TABLET | Freq: Every day | ORAL | Status: DC
  Administered 2017-03-08: 10 meq via ORAL
  Filled 2017-03-07: qty 1

## 2017-03-07 MED ORDER — ACETAMINOPHEN 325 MG PO TABS: 650.0000 mg | ORAL_TABLET | Freq: Four times a day (QID) | ORAL | Status: DC | PRN

## 2017-03-07 MED ORDER — PANTOPRAZOLE SODIUM 40 MG PO TBEC
40.0000 mg | DELAYED_RELEASE_TABLET | Freq: Every day | ORAL | Status: DC
  Administered 2017-03-08 – 2017-03-11 (×4): 40 mg via ORAL
  Filled 2017-03-07 (×4): qty 1

## 2017-03-07 MED ORDER — VANCOMYCIN HCL IN DEXTROSE 750-5 MG/150ML-% IV SOLN
750.0000 mg | Freq: Two times a day (BID) | INTRAVENOUS | Status: DC
  Administered 2017-03-08 – 2017-03-09 (×3): 750 mg via INTRAVENOUS
  Filled 2017-03-07 (×3): qty 150

## 2017-03-07 MED ORDER — SODIUM CHLORIDE 0.9% FLUSH
3.0000 mL | Freq: Two times a day (BID) | INTRAVENOUS | Status: DC
  Administered 2017-03-07 – 2017-03-11 (×9): 3 mL via INTRAVENOUS

## 2017-03-07 MED ORDER — METOPROLOL TARTRATE 50 MG PO TABS
50.0000 mg | ORAL_TABLET | Freq: Two times a day (BID) | ORAL | Status: DC
  Administered 2017-03-08 – 2017-03-11 (×8): 50 mg via ORAL
  Filled 2017-03-07 (×8): qty 1

## 2017-03-07 MED ORDER — POLYETHYLENE GLYCOL 3350 17 G PO PACK
17.0000 g | PACK | Freq: Every day | ORAL | Status: DC
  Administered 2017-03-08 – 2017-03-10 (×4): 17 g via ORAL
  Filled 2017-03-07 (×4): qty 1

## 2017-03-07 MED ORDER — HYDROCODONE-ACETAMINOPHEN 5-325 MG PO TABS
1.0000 | ORAL_TABLET | ORAL | Status: DC | PRN
  Administered 2017-03-07: 2 via ORAL
  Administered 2017-03-10: 1 via ORAL
  Filled 2017-03-07: qty 2
  Filled 2017-03-07: qty 1

## 2017-03-07 NOTE — Progress Notes (Signed)
Pharmacy Antibiotic Note  James [name] is a 83 y.o. male admitted on 03/07/2017 with cellulitis.  Pharmacy has been consulted for Vancomycin and Zosyn dosing.  Plan:  Vancomycin 1000mg  x 1 then 750mg  IV q12h Check trough at steady state Zosyn 3.375gm IV q8h, EID Monitor labs, renal fxn, progress and c/s Deescalate ABX when improved / appropriate.    Height: 5\' 4"  (162.6 cm) Weight: 158 lb 11.7 oz (72 kg) IBW/kg (Calculated) : 59.2  Temp (24hrs), Avg:97.5 F (36.4 C), Min:97.5 F (36.4 C), Max:97.5 F (36.4 C)  No results for input(s): WBC, CREATININE, LATICACIDVEN, VANCOTROUGH, VANCOPEAK, VANCORANDOM, GENTTROUGH, GENTPEAK, GENTRANDOM, TOBRATROUGH, TOBRAPEAK, TOBRARND, AMIKACINPEAK, AMIKACINTROU, AMIKACIN in the last 168 hours.  CrCl cannot be calculated (Patient's most recent lab result is older than the maximum 21 days allowed.).    Allergies  Allergen Reactions  . No Known Allergies    Antimicrobials this admission: Vancomycin 9/10 >>  Zosyn 9/10 >>   Dose adjustments this admission:  Microbiology results:  BCx: pending  wound cx: pending  Thank you for allowing pharmacy to be a part of this patient's care.  Valrie HartHall, Tehani Mersman A 03/07/2017 3:19 PM

## 2017-03-08 ENCOUNTER — Inpatient Hospital Stay (HOSPITAL_COMMUNITY): Payer: Medicare Other

## 2017-03-08 LAB — BASIC METABOLIC PANEL
Anion gap: 8 (ref 5–15)
BUN: 18 mg/dL (ref 6–20)
CO2: 29 mmol/L (ref 22–32)
CREATININE: 1.02 mg/dL (ref 0.61–1.24)
Calcium: 8.7 mg/dL — ABNORMAL LOW (ref 8.9–10.3)
Chloride: 97 mmol/L — ABNORMAL LOW (ref 101–111)
GFR calc Af Amer: >60 mL/min (ref >60)
GFR calc non Af Amer: >60 mL/min (ref >60)
GLUCOSE: 89 mg/dL (ref 65–99)
Potassium: 3.6 mmol/L (ref 3.5–5.1)
Sodium: 134 mmol/L — ABNORMAL LOW (ref 135–145)

## 2017-03-08 MED ORDER — WARFARIN SODIUM 2.5 MG PO TABS
1.2500 mg | ORAL_TABLET | Freq: Once | ORAL | Status: AC
  Administered 2017-03-08: 1.25 mg via ORAL
  Filled 2017-03-08: qty 1

## 2017-03-08 MED ORDER — COLLAGENASE 250 UNIT/GM EX OINT
TOPICAL_OINTMENT | Freq: Every day | CUTANEOUS | Status: DC
  Administered 2017-03-08 – 2017-03-11 (×4): via TOPICAL
  Filled 2017-03-08: qty 30

## 2017-03-08 MED ORDER — WARFARIN - PHARMACIST DOSING INPATIENT: Freq: Every day | Status: DC

## 2017-03-08 MED ORDER — GADOBENATE DIMEGLUMINE 529 MG/ML IV SOLN
15.0000 mL | Freq: Once | INTRAVENOUS | Status: AC | PRN
  Administered 2017-03-08: 15 mL via INTRAVENOUS

## 2017-03-08 NOTE — Evaluation (Signed)
Physical Therapy Evaluation Patient Details Name: James [name] MRN: [id number] DOB: 07/29/1921 Today's Date: 03/08/2017   History of Present Illness  James [name] is a 83yo male [facility] after progression of leftheal ulcer, now more painful, odorous, and changes to wound margins consistent with diseas progression. MRI on 9/11 shows findings consistent with calcaneal osteomyelitis. PMH: CVA, AF, recent pelvic fracture, PE, HTN. At baseline, pt performs limited community AMB with RW and is independnet with ADL, however as of recent has required more ssistance for dressing changes, bathing, etc and has not toelrated mobility as well due to wound.   Clinical Impression  Pt admitted with above diagnosis. Pt currently with functional limitations due to the deficits listed below (see "PT Problem List"). Upon entry, the patient is received semirecumbent in bed, no family/caregiver present. The pt is awake and agreeable to participate. No acute distress noted at this time. The pt is alert and oriented to self, pleasant, conversational, and following simple and multi-step commands consistently, but has difficulty providing great detail when questioned and is frustrated when cued for specifics. Manual muscle testing reveals mild to moderate deficits in BLE/BUE as detail in this note. Functional mobility assessment is limited by absence of updated weight bearing status in the setting of recent imaging findings: pt also unclear in providing info on prior weight bearing limitations. Pt reports he is to undergo surgical debridement tomorrow morning, but this has not been reflected in the medical record as of most recent chart review. If patient undergoes general anesthesia, new PT order will be required to continue care: updated weight bearing status would be helpful. Pt will benefit from skilled PT intervention to increase independence and safety with basic mobility in preparation for discharge to  the venue listed below.       Follow Up Recommendations SNF    Equipment Recommendations  None recommended by PT    Recommendations for Other Services       Precautions / Restrictions Precautions Precautions: Fall Precaution Comments: Left heel ulcer, unclear weight bearing status Restrictions Weight Bearing Restrictions: Yes LLE Weight Bearing:  (unclear at this time)      Mobility  Bed Mobility Overal bed mobility: Modified Independent Bed Mobility: Rolling Rolling: Modified independent (Device/Increase time)         General bed mobility comments: rolling strength is good, limb movement in bed is good.   Transfers Overall transfer level:  (not attempted, as no weight bearing status is established and patient is c/o being very fatigued from recent trip to MRI )                  Ambulation/Gait                Stairs            Wheelchair Mobility    Modified Rankin (Stroke Patients Only)       Balance                                             Pertinent Vitals/Pain Pain Assessment: Faces Faces Pain Scale: Hurts a little bit Pain Location: left heel ulcer  Pain Intervention(s): Limited activity within patient's tolerance;Monitored during session    Home Living Family/patient expects to be discharged to:: Private residence Living Arrangements: Spouse/significant other Available Help at Discharge: Available PRN/intermittently;Family (  family assists as needed, many live nearby, ) Type of Home: House Home Access: Ramped entrance     Home Layout: One level Home Equipment: Grab bars - tub/shower;Tub bench;Bedside commode;Walker - 2 wheels;Walker - 4 wheels;Hospital bed;Wheelchair - manual      Prior Function           Comments: family has been helping with bathing; pt has not AMB out side of house except for medical appointments      Hand Dominance   Dominant Hand: Right    Extremity/Trunk Assessment    Upper Extremity Assessment Upper Extremity Assessment: Generalized weakness (grips mildly weak and equal; BUE grossly 4/5 at the elbows )    Lower Extremity Assessment Lower Extremity Assessment: Generalized weakness (hip grossly 4+/5 in sagittal plane)    Cervical / Trunk Assessment Cervical / Trunk Assessment: Kyphotic  Communication   Communication: HOH  Cognition Arousal/Alertness: Lethargic Behavior During Therapy: Agitated (fatigued and frustrated due to fatigue) Overall Cognitive Status:  (mild to [employer] term memory limitations )                                 General Comments: easily frustrated with questioning for additional detail       General Comments      Exercises     Assessment/Plan    PT Assessment Patient needs continued PT services  PT Problem List Decreased strength;Decreased knowledge of use of DME;Decreased activity tolerance;Decreased balance;Pain;Decreased mobility       PT Treatment Interventions Gait training;Functional mobility training;Therapeutic activities;Therapeutic exercise;Balance training;Patient/family education    PT Goals (Current goals can be found in the Care Plan section)  Acute Rehab PT Goals Patient Stated Goal: decresae pain, regain strength  PT Goal Formulation: With patient Time For Goal Achievement: 03/22/17 Potential to Achieve Goals: Fair    Frequency Min 2X/week   Barriers to discharge        Co-evaluation               AM-PAC PT "6 Clicks" Daily Activity  Outcome Measure Difficulty turning over in bed (including adjusting bedclothes, sheets and blankets)?: A Little Difficulty moving from lying on back to sitting on the side of the bed? : A Lot Difficulty sitting down on and standing up from a chair with arms (e.g., wheelchair, bedside commode, etc,.)?: Unable Help needed moving to and from a bed to chair (including a wheelchair)?: Total Help needed walking in hospital room?:  Total Help needed climbing 3-5 steps with a railing? : Total 6 Click Score: 9    End of Session   Activity Tolerance: Patient tolerated treatment well;Patient limited by fatigue Patient left: in bed;with call bell/phone within reach;with bed alarm set;Other (comment) (bed is moved to a chair-recumbent postion) Nurse Communication: Mobility status PT Visit Diagnosis: Muscle weakness (generalized) (M62.81);Other abnormalities of gait and mobility (R26.89);Repeated falls (R29.6)    Time: 1610-9604 PT Time Calculation (min) (ACUTE ONLY): 16 min   Charges:   PT Evaluation $PT Eval Moderate Complexity: 1 Mod     PT G Codes:        3:15 PM, 15-Mar-2017 Rosamaria Lints, PT, DPT Physical Therapist - [place] [phone] (Office)    Buccola,Allan C March 15, 2017, 3:10 PM

## 2017-03-08 NOTE — H&P (Signed)
James [name] MRN: [id number] DOB/AGE: 02/10/1922 83 y.o. Primary Care Physician:Orian Amberg, Ramon Dredge, MD Admit date: 03/07/2017 Chief Complaint: Heel ulcer HPI: This is documentation of history and physical done in my office yesterday 03/07/2017. He came into the office as a work in because the ulceration on his left heel which is been being treated for over a month had changed. It had more erosion of the skin and had a foul odor. I was concerned about some gangrenous changes so he was admitted. He has multiple other medical problems including a fairly recent fractured pelvis, permanent atrial fib, previous stroke, chronic anticoagulation, GERD, hyperlipidemia, hypertension, history of pulmonary embolus. His wife has had increasing problems trying to take care of him at home. They have been putting dressings on his heel but as mentioned in the last 48 hours or has been significant change. He's poorly ambulatory at baseline. He's not been eating very well. No chest pain. No rapid atrial fib. He still has some swelling and he does have a history of chronic diastolic heart failure. No fever or chills.  Past Medical History:  Diagnosis Date  . Anemia    H&H of 12.4/38.5 with MCV of 110 in /2012  . Arthritis   . Atrial fibrillation, chronic (HCC)    Permanent atrial fibrillation  . Cerebrovascular disease    Left posterior parietal infarction  . Chronic anticoagulation    H&H of 12.4/38.5 in 05/2011; negative stool Hemoccults in 05/2009  . Deep vein thrombosis (DVT) (HCC)   . Dysrhythmia   . Fasting hyperglycemia    Glucose values of 70-135 in 2010-2012  . GERD (gastroesophageal reflux disease)   . Herpes zoster 2011  . History of kidney stones   . Hyperlipidemia    Lipid profile in 05/2011:138, 120, 34, 80  . Hypertension    Lab 05/2011: Normal CMet except glucose of 121 and creatinine of 1.37  . Pulmonary embolism (HCC)   . Stroke (HCC)   . TOTAL KNEE REVISIONLeft    Past Surgical History:   Procedure Laterality Date  . APPENDECTOMY    . CHOLECYSTECTOMY    . COLONOSCOPY    . ESOPHAGEAL DILATION N/A 07/30/2016   Procedure: ESOPHAGEAL DILATION;  Surgeon: Malissa Hippo, MD;  Location: [facility];  Service: Endoscopy;  Laterality: N/A;  . ESOPHAGOGASTRODUODENOSCOPY N/A 07/30/2016   Procedure: ESOPHAGOGASTRODUODENOSCOPY (EGD);  Surgeon: Malissa Hippo, MD;  Location: [facility];  Service: Endoscopy;  Laterality: N/A;  730  . EYE SURGERY Bilateral   . HEMORRHOID SURGERY    . HIP FRACTURE SURGERY    . JOINT REPLACEMENT    . RENAL ARTERY STENT    . TOTAL KNEE REVISION Left 03/18/2015   Procedure: TOTAL KNEE REVISION;  Surgeon: Valeria Batman, MD;  Location: [facility] OR;  Service: Orthopedics;  Laterality: Left;  . WISDOM TOOTH EXTRACTION          Family History  Problem Relation Age of Onset  . Cancer Mother    His brother died of cardiac issues and another brother had Down syndrome Social History:  reports that he has never smoked. He has never used smokeless tobacco. He reports that he does not drink alcohol or use drugs. He lives at home with his wife  Allergies:  Allergies  Allergen Reactions  . No Known Allergies     Medications Prior to Admission  Medication Sig Dispense Refill  . allopurinol (ZYLOPRIM) 300 MG tablet Take 300 mg by mouth daily with supper.     Marland Kitchen  furosemide (LASIX) 40 MG tablet TAKE 1/2 TABLET BY MOUTH DAILY. 45 tablet 2  . HYDROcodone-acetaminophen (NORCO/VICODIN) 5-325 MG tablet Take 1-2 tablets by mouth every 4 (four) hours as needed for moderate pain. 30 tablet 0  . lisinopril (PRINIVIL,ZESTRIL) 40 MG tablet TAKE ONE TABLET BY MOUTH DAILY. 30 tablet 11  . metoprolol (LOPRESSOR) 50 MG tablet TAKE (1) TABLET BY MOUTH TWICE DAILY. 60 tablet 11  . Multiple Vitamin (MULTIVITAMIN) tablet Take 1 tablet by mouth daily.      . Multiple Vitamins-Minerals (PRESERVISION AREDS PO) Take 1 tablet by mouth 2 (two) times daily.     . pantoprazole  (PROTONIX) 40 MG tablet Take 40 mg by mouth daily.    . polyethylene glycol powder (GLYCOLAX/MIRALAX) powder Take 17 g by mouth at bedtime.  0  . potassium chloride (K-DUR) 10 MEQ tablet TAKE ONE TABLET BY MOUTH DAILY. 30 tablet 11  . simvastatin (ZOCOR) 40 MG tablet TAKE (1) TABLET BY MOUTH AT BEDTIME FOR CHOLESTEROL. 30 tablet 3  . tamsulosin (FLOMAX) 0.4 MG CAPS capsule Take 0.4 mg by mouth daily.  11  . warfarin (COUMADIN) 2.5 MG tablet Take 1.25-2.5 mg by mouth daily. Take 1.25mg  (one-half tablet) on MWF, then take 2.5mg  (one whole tablet) on all other days as directed per [facility]         QMV:HQIONROS:apart from the symptoms mentioned above,there are no other symptoms referable to all systems reviewed.10 point review of systems otherwise negative  Physical Exam: Blood pressure (!) 158/78, pulse 60, temperature 97.8 F (36.6 C), temperature source Oral, resp. rate 16, height 5\' 4"  (1.626 m), weight 72 kg (158 lb 11.7 oz), SpO2 98 %. Constitutional: He is awake and alert. Eyes: Pupils react. EOMI. Ears nose mouth and throat: His throat is clear. He is hard of hearing. His neck is supple. Cardiovascular: His heart is irregularly irregular with a soft systolic murmur. No gallop. 2+ edema bilaterally. Respiratory: His respiratory effort is normal and his lungs are clear. Gastrointestinal: His abdomen is soft with no masses. Skin: He has a stage II decubitus on his sacrum and he has an ulceration on his left heel which has an eschar and has a foul odor. Musculoskeletal: He is very weak in general and is sitting in a wheelchair. Neurological: No focal abnormalities. Psychiatric: Normal mood and affect    Recent Labs  03/07/17 1626  WBC 7.8  NEUTROABS 4.5  HGB 12.2*  HCT 37.6*  MCV 94.2  PLT 165    Recent Labs  03/07/17 1626 03/08/17 0503  NA 135 134*  K 4.2 3.6  CL 98* 97*  CO2 28 29  GLUCOSE 138* 89  BUN 19 18  CREATININE 0.92 1.02  CALCIUM 8.9 8.7*   lablast2(ast:2,ALT:2,alkphos:2,bilitot:2,prot:2,albumin:2)@    Recent Results (from the past 240 hour(s))  Culture, blood (routine x 2)     Status: None (Preliminary result)   Collection Time: 03/07/17  4:26 PM  Result Value Ref Range Status   Specimen Description BLOOD LEFT HAND  Final   Special Requests   Final    BOTTLES DRAWN AEROBIC AND ANAEROBIC Blood Culture results may not be optimal due to an inadequate volume of blood received in culture bottles   Culture PENDING  Incomplete   Report Status PENDING  Incomplete  Culture, blood (routine x 2)     Status: None (Preliminary result)   Collection Time: 03/07/17  4:26 PM  Result Value Ref Range Status   Specimen Description RIGHT ANTECUBITAL  Final   Special Requests   Final    BOTTLES DRAWN AEROBIC AND ANAEROBIC Blood Culture results may not be optimal due to an inadequate volume of blood received in culture bottles   Culture PENDING  Incomplete   Report Status PENDING  Incomplete  Aerobic Culture (superficial specimen)     Status: None (Preliminary result)   Collection Time: 03/07/17  4:50 PM  Result Value Ref Range Status   Specimen Description ABSCESS  Final   Special Requests NONE  Final   Gram Stain   Final    NO WBC SEEN FEW GRAM NEGATIVE RODS FEW GRAM POSITIVE COCCI Performed at [facility], [address] [place], [place] [address]    Culture PENDING  Incomplete   Report Status PENDING  Incomplete     No results found. Impression: He has cellulitis and abscess of his heel. He is going to be started on IV antibiotics because of concerns about gangrene. He will have MRI to rule out osteomyelitis. He will have vascular study. Wound care consultation. Active Problems:   Pressure injury of skin   Cellulitis and abscess of left leg     Plan: As above      James [name] L   03/08/2017, 8:13 AM

## 2017-03-08 NOTE — Consult Note (Signed)
WOC Nurse wound consult note Consultation completed by use of remote telehealth camera cart and with assistance from bedside nurse Reason for Consult: heel ulcer Wound type: Unstageable Pressure Injury: left heel Stage 2 Pressure injuries: sacrum and left buttock Pressure Injury POA: Yes Measurement: Left heel: 2.9cm x 3.8cm x 0.6cm; 100% grey/black soft eschar Sacrum: 0.2cm x 0.2cm x 0.1cm: 100% pink Left buttock: 0.1cm x 0.1cm x 0.1cm and 0.1cm x0.1cm x 0.1cm with largest proximal left buttock-2cm x 1.5cm x 0.1cm  All are clean, pink, dry Wound bed: see above Drainage (amount, consistency, odor) none from the sacrum, moderate left heel with odor reported in Dr. Juanetta GoslingHawkins notes and bedside RN report Periwound: intact, blanchable redness around the sacral/buttock wounds, extends 3-4 cm circumferentially  Dressing procedure/placement/frequency: Add low air loss mattress for pressure redistribution and moisture management No incontinence briefs while inpatient  Add Prevalon boot to offload the left heel, I have explained rationale to patient's wife and made sure she understands to take with them at DC for use.  Bedside RN to teach wife application. Add enzymatic debridement ointment for the left heel, however I feel that surgical consultation for either bedside debridement or OR debridement would be better in the light of wound drainage odor and fluctuance of eschar.  Continue silicone foam for protection of the sacrum/buttock ulcers, change every 3 days and PRN soilage. Managing incontinence with condom catheter  Contacted Dr. Juanetta GoslingHawkins office left VM with LPN voice mail line to suggest surgical debridement for the left heel.  Discussed POC with patient's wife and patient and bedside nurse.  Re consult if needed, will not follow at this time. Thanks  Alonia Dibuono [employer] MSN, RN,CWOCN, CNS, CWON-[facility] [phone])

## 2017-03-08 NOTE — Progress Notes (Signed)
ANTICOAGULATION CONSULT NOTE - Initial Consult  Pharmacy Consult for Coumadin Indication: atrial fibrillation  Allergies  Allergen Reactions  . No Known Allergies     Patient Measurements: Height:  (162.6 cm) Weight: 158 lb 11.7 oz (72 kg) IBW/kg (Calculated) : 59.2  Vital Signs: Temp: 97.8 F (36.6 C) (09/11 0541) Temp Source: Oral (09/11 0541) BP: 158/78 (09/11 0541) Pulse Rate: 60 (09/11 0541)  Labs:  Recent Labs  03/07/17 1626 03/08/17 0503  HGB 12.2*  --   HCT 37.6*  --   PLT 165  --   LABPROT 26.3*  --   INR 2.44  --   CREATININE 0.92 1.02    Estimated Creatinine Clearance: 41.2 mL/min (by C-G formula based on SCr of 1.02 mg/dL).   Medical History: Past Medical History:  Diagnosis Date  . Anemia    H&H of 12.4/38.5 with MCV of 110 in /2012  . Arthritis   . Atrial fibrillation, chronic (HCC)    Permanent atrial fibrillation  . Cerebrovascular disease    Left posterior parietal infarction  . Chronic anticoagulation    H&H of 12.4/38.5 in 05/2011; negative stool Hemoccults in 05/2009  . Deep vein thrombosis (DVT) (HCC)   . Dysrhythmia   . Fasting hyperglycemia    Glucose values of 70-135 in 2010-2012  . GERD (gastroesophageal reflux disease)   . Herpes zoster 2011  . History of kidney stones   . Hyperlipidemia    Lipid profile in 05/2011:138, 120, 34, 80  . Hypertension    Lab 05/2011: Normal CMet except glucose of 121 and creatinine of 1.37  . Pulmonary embolism (HCC)   . Stroke (HCC)   . TOTAL KNEE REVISIONLeft     Medications:  Prescriptions Prior to Admission  Medication Sig Dispense Refill Last Dose  . allopurinol (ZYLOPRIM) 300 MG tablet Take 300 mg by mouth daily with supper.    03/07/2017 at Unknown time  . furosemide (LASIX) 40 MG tablet TAKE 1/2 TABLET BY MOUTH DAILY. 45 tablet 2 03/07/2017 at Unknown time  . HYDROcodone-acetaminophen (NORCO/VICODIN) 5-325 MG tablet Take 1-2 tablets by mouth every 4 (four) hours as needed for  moderate pain. 30 tablet 0 unknown  . lisinopril (PRINIVIL,ZESTRIL) 40 MG tablet TAKE ONE TABLET BY MOUTH DAILY. (Patient taking differently: TAKE ONE TABLET BY MOUTH at bedtime) 30 tablet 11 03/07/2017 at Unknown time  . metoprolol (LOPRESSOR) 50 MG tablet TAKE (1) TABLET BY MOUTH TWICE DAILY. 60 tablet 11 03/07/2017 at 2200  . Multiple Vitamin (MULTIVITAMIN) tablet Take 1 tablet by mouth daily.     03/07/2017 at Unknown time  . Multiple Vitamins-Minerals (PRESERVISION AREDS PO) Take 1 tablet by mouth 2 (two) times daily.    03/07/2017 at Unknown time  . pantoprazole (PROTONIX) 40 MG tablet Take 40 mg by mouth daily.   unknown  . polyethylene glycol powder (GLYCOLAX/MIRALAX) powder Take 17 g by mouth at bedtime.  0 unknown  . potassium chloride (K-DUR) 10 MEQ tablet TAKE ONE TABLET BY MOUTH DAILY. 30 tablet 11 03/07/2017 at Unknown time  . simvastatin (ZOCOR) 40 MG tablet TAKE (1) TABLET BY MOUTH AT BEDTIME FOR CHOLESTEROL. 30 tablet 3 03/07/2017 at Unknown time  . tamsulosin (FLOMAX) 0.4 MG CAPS capsule Take 0.4 mg by mouth daily.  11 03/07/2017 at Unknown time  . warfarin (COUMADIN) 2.5 MG tablet Take 2.5 mg by mouth daily. Except on Mondays   03/07/2017 at Unknown time    Assessment: 83 yo male admitted with cellulitis. He is  seen in the anticoagulation clinic and on chronic coumadin for atrial fibrillation. Pharmacy asked to continue coumadin and dosing while inpatient. INR was therapeutic 9/10.  Goal of Therapy:  INR 2-3 Monitor platelets by anticoagulation protocol: Yes   Plan:  Coumadin 1.25mg  x today Daily PT-INR Monitor for S/S of bleeding  Elder CyphersLorie Daneil Beem, BS Loura BackPharm D, BCPS Clinical Pharmacist Pager [phone] 03/08/2017,11:42 AM

## 2017-03-08 NOTE — Progress Notes (Signed)
Subjective: He says he feels okay. No new complaints. No fever overnight. His breathing is okay. No chest pain. He's had difficulty with IV access.  Objective: Vital signs in last 24 hours: Temp:  [97.5 F (36.4 C)-98.1 F (36.7 C)] 97.8 F (36.6 C) (09/11 0541) Pulse Rate:  [55-69] 60 (09/11 0541) Resp:  [16-18] 16 (09/11 0541) BP: (139-170)/(78-91) 158/78 (09/11 0541) SpO2:  [97 %-100 %] 98 % (09/11 0541) Weight:  [72 kg (158 lb 11.7 oz)] 72 kg (158 lb 11.7 oz) (09/10 1431) Weight change:     Intake/Output from previous day: 09/10 0701 - 09/11 0700 In: 300 [IV Piggyback:300] Out: 200 [Urine:200]  PHYSICAL EXAM General appearance: alert, cooperative and mild distress Resp: clear to auscultation bilaterally Cardio: irregularly irregular rhythm GI: soft, non-tender; bowel sounds normal; no masses,  no organomegaly Extremities: The ulceration on his heel still has what looks like an eschar has some drainage and has a foul smell He has a stage II decubitus on his sacrum  Lab Results:  Results for orders placed or performed during the hospital encounter of 03/07/17 (from the past 48 hour(s))  Comprehensive metabolic panel     Status: Abnormal   Collection Time: 03/07/17  4:26 PM  Result Value Ref Range   Sodium 135 135 - 145 mmol/L   Potassium 4.2 3.5 - 5.1 mmol/L   Chloride 98 (L) 101 - 111 mmol/L   CO2 28 22 - 32 mmol/L   Glucose, Bld 138 (H) 65 - 99 mg/dL   BUN 19 6 - 20 mg/dL   Creatinine, Ser 0.92 0.61 - 1.24 mg/dL   Calcium 8.9 8.9 - 10.3 mg/dL   Total Protein 6.9 6.5 - 8.1 g/dL   Albumin 2.9 (L) 3.5 - 5.0 g/dL   AST 40 15 - 41 U/L   ALT 30 17 - 63 U/L   Alkaline Phosphatase 172 (H) 38 - 126 U/L   Total Bilirubin 0.9 0.3 - 1.2 mg/dL   GFR calc non Af Amer >60 >60 mL/min   GFR calc Af Amer >60 >60 mL/min    Comment: (NOTE) The eGFR has been calculated using the CKD EPI equation. This calculation has not been validated in all clinical situations. eGFR's  persistently <60 mL/min signify possible Chronic Kidney Disease.    Anion gap 9 5 - 15  CBC WITH DIFFERENTIAL     Status: Abnormal   Collection Time: 03/07/17  4:26 PM  Result Value Ref Range   WBC 7.8 4.0 - 10.5 K/uL   RBC 3.99 (L) 4.22 - 5.81 MIL/uL   Hemoglobin 12.2 (L) 13.0 - 17.0 g/dL   HCT 37.6 (L) 39.0 - 52.0 %   MCV 94.2 78.0 - 100.0 fL   MCH 30.6 26.0 - 34.0 pg   MCHC 32.4 30.0 - 36.0 g/dL   RDW 15.1 11.5 - 15.5 %   Platelets 165 150 - 400 K/uL   Neutrophils Relative % 58 %   Neutro Abs 4.5 1.7 - 7.7 K/uL   Lymphocytes Relative 20 %   Lymphs Abs 1.5 0.7 - 4.0 K/uL   Monocytes Relative 15 %   Monocytes Absolute 1.2 (H) 0.1 - 1.0 K/uL   Eosinophils Relative 6 %   Eosinophils Absolute 0.5 0.0 - 0.7 K/uL   Basophils Relative 1 %   Basophils Absolute 0.0 0.0 - 0.1 K/uL  Protime-INR     Status: Abnormal   Collection Time: 03/07/17  4:26 PM  Result Value Ref Range   Prothrombin  Time 26.3 (H) 11.4 - 15.2 seconds   INR 2.44   Culture, blood (routine x 2)     Status: None (Preliminary result)   Collection Time: 03/07/17  4:26 PM  Result Value Ref Range   Specimen Description BLOOD LEFT HAND    Special Requests      BOTTLES DRAWN AEROBIC AND ANAEROBIC Blood Culture results may not be optimal due to an inadequate volume of blood received in culture bottles   Culture PENDING    Report Status PENDING   Culture, blood (routine x 2)     Status: None (Preliminary result)   Collection Time: 03/07/17  4:26 PM  Result Value Ref Range   Specimen Description RIGHT ANTECUBITAL    Special Requests      BOTTLES DRAWN AEROBIC AND ANAEROBIC Blood Culture results may not be optimal due to an inadequate volume of blood received in culture bottles   Culture PENDING    Report Status PENDING   Aerobic Culture (superficial specimen)     Status: None (Preliminary result)   Collection Time: 03/07/17  4:50 PM  Result Value Ref Range   Specimen Description ABSCESS    Special Requests NONE     Gram Stain      NO WBC SEEN FEW GRAM NEGATIVE RODS FEW GRAM POSITIVE COCCI Performed at [facility], [place] [address] [place], [place] [address]    Culture PENDING    Report Status PENDING   Basic metabolic panel     Status: Abnormal   Collection Time: 03/08/17  5:03 AM  Result Value Ref Range   Sodium 134 (L) 135 - 145 mmol/L   Potassium 3.6 3.5 - 5.1 mmol/L   Chloride 97 (L) 101 - 111 mmol/L   CO2 29 22 - 32 mmol/L   Glucose, Bld 89 65 - 99 mg/dL   BUN 18 6 - 20 mg/dL   Creatinine, Ser 1.02 0.61 - 1.24 mg/dL   Calcium 8.7 (L) 8.9 - 10.3 mg/dL   GFR calc non Af Amer >60 >60 mL/min   GFR calc Af Amer >60 >60 mL/min    Comment: (NOTE) The eGFR has been calculated using the CKD EPI equation. This calculation has not been validated in all clinical situations. eGFR's persistently <60 mL/min signify possible Chronic Kidney Disease.    Anion gap 8 5 - 15    ABGS No results for input(s): PHART, PO2ART, TCO2, HCO3 in the last 72 hours.  Invalid input(s): PCO2 CULTURES Recent Results (from the past 240 hour(s))  Culture, blood (routine x 2)     Status: None (Preliminary result)   Collection Time: 03/07/17  4:26 PM  Result Value Ref Range Status   Specimen Description BLOOD LEFT HAND  Final   Special Requests   Final    BOTTLES DRAWN AEROBIC AND ANAEROBIC Blood Culture results may not be optimal due to an inadequate volume of blood received in culture bottles   Culture PENDING  Incomplete   Report Status PENDING  Incomplete  Culture, blood (routine x 2)     Status: None (Preliminary result)   Collection Time: 03/07/17  4:26 PM  Result Value Ref Range Status   Specimen Description RIGHT ANTECUBITAL  Final   Special Requests   Final    BOTTLES DRAWN AEROBIC AND ANAEROBIC Blood Culture results may not be optimal due to an inadequate volume of blood received in culture bottles   Culture PENDING  Incomplete   Report Status PENDING  Incomplete  Aerobic Culture (superficial  specimen)     Status: None (Preliminary result)   Collection Time: 03/07/17  4:50 PM  Result Value Ref Range Status   Specimen Description ABSCESS  Final   Special Requests NONE  Final   Gram Stain   Final    NO WBC SEEN FEW GRAM NEGATIVE RODS FEW GRAM POSITIVE COCCI Performed at [facility], [address] [place], [place] [address]    Culture PENDING  Incomplete   Report Status PENDING  Incomplete   Studies/Results: No results found.  Medications:  Prior to Admission:  Prescriptions Prior to Admission  Medication Sig Dispense Refill Last Dose  . allopurinol (ZYLOPRIM) 300 MG tablet Take 300 mg by mouth daily with supper.    10/31/2016 at Unknown time  . furosemide (LASIX) 40 MG tablet TAKE 1/2 TABLET BY MOUTH DAILY. 45 tablet 2 10/31/2016 at Unknown time  . HYDROcodone-acetaminophen (NORCO/VICODIN) 5-325 MG tablet Take 1-2 tablets by mouth every 4 (four) hours as needed for moderate pain. 30 tablet 0   . lisinopril (PRINIVIL,ZESTRIL) 40 MG tablet TAKE ONE TABLET BY MOUTH DAILY. 30 tablet 11 10/31/2016 at Unknown time  . metoprolol (LOPRESSOR) 50 MG tablet TAKE (1) TABLET BY MOUTH TWICE DAILY. 60 tablet 11 10/31/2016 at 2100  . Multiple Vitamin (MULTIVITAMIN) tablet Take 1 tablet by mouth daily.     10/31/2016 at Unknown time  . Multiple Vitamins-Minerals (PRESERVISION AREDS PO) Take 1 tablet by mouth 2 (two) times daily.    10/31/2016 at Unknown time  . pantoprazole (PROTONIX) 40 MG tablet Take 40 mg by mouth daily.   10/31/2016 at Unknown time  . polyethylene glycol powder (GLYCOLAX/MIRALAX) powder Take 17 g by mouth at bedtime.  0 10/31/2016 at Unknown time  . potassium chloride (K-DUR) 10 MEQ tablet TAKE ONE TABLET BY MOUTH DAILY. 30 tablet 11 10/31/2016 at Unknown time  . simvastatin (ZOCOR) 40 MG tablet TAKE (1) TABLET BY MOUTH AT BEDTIME FOR CHOLESTEROL. 30 tablet 3   . tamsulosin (FLOMAX) 0.4 MG CAPS capsule Take 0.4 mg by mouth daily.  11 10/31/2016 at Unknown time  . warfarin  (COUMADIN) 2.5 MG tablet Take 1.25-2.5 mg by mouth daily. Take 1.'25mg'$  (one-half tablet) on MWF, then take 2.'5mg'$  (one whole tablet) on all other days as directed per [facility]   10/31/2016 at Unknown time   Scheduled: . allopurinol  300 mg Oral Q supper  . furosemide  20 mg Oral Daily  . lisinopril  40 mg Oral Daily  . metoprolol tartrate  50 mg Oral BID  . multivitamin-lutein  1 capsule Oral BID  . pantoprazole  40 mg Oral Daily  . polyethylene glycol  17 g Oral QHS  . potassium chloride  10 mEq Oral Daily  . simvastatin  20 mg Oral q1800  . sodium chloride flush  3 mL Intravenous Q12H  . tamsulosin  0.4 mg Oral Daily   Continuous: . piperacillin-tazobactam (ZOSYN)  IV 3.375 g (03/08/17 0534)  . vancomycin Stopped (03/08/17 7026)   VZC:HYIFOYDXAJOIN **OR** acetaminophen, HYDROcodone-acetaminophen, ondansetron **OR** ondansetron (ZOFRAN) IV  Assesment: He was admitted with cellulitis and abscess of his left heel. He has started on IV antibiotics. His white blood count is not up. He's not had any fever. He has poor venous access and is on vancomycin so I'm going to see about getting a PICC line placed. I'm concerned that he may need fairly prolonged IV antibiotics for this. Wound care consultation has been requested for local  care. He has chronic atrial fib. This is stable. He is chronically anticoagulated. He is very weak and I will request PT evaluation because I think he may end up needing rehabilitation again. Active Problems:   Atrial fibrillation, chronic (HCC)   Hypertension   CKD (chronic kidney disease), stage III   Cerebrovascular disease   Chronic anticoagulation   Pressure injury of skin   Cellulitis and abscess of left leg    Plan: PT evaluation. I'm going to get a MRI of his foot to see if he has osteomyelitis. Vascular study of his leg to see if he has significant vascular disease that may make healing more difficult. Continue other treatments.    LOS: 1 day    Hether Anselmo L 03/08/2017, 8:36 AM

## 2017-03-09 DIAGNOSIS — L97422 Non-pressure chronic ulcer of left heel and midfoot with fat layer exposed: Secondary | ICD-10-CM | POA: Diagnosis present

## 2017-03-09 LAB — BASIC METABOLIC PANEL
Anion gap: 7 (ref 5–15)
BUN: 16 mg/dL (ref 6–20)
CHLORIDE: 97 mmol/L — AB (ref 101–111)
CO2: 31 mmol/L (ref 22–32)
CREATININE: 1.02 mg/dL (ref 0.61–1.24)
Calcium: 8.8 mg/dL — ABNORMAL LOW (ref 8.9–10.3)
GFR calc Af Amer: >60 mL/min (ref >60)
GFR calc non Af Amer: >60 mL/min (ref >60)
Glucose, Bld: 87 mg/dL (ref 65–99)
POTASSIUM: 3.3 mmol/L — AB (ref 3.5–5.1)
SODIUM: 135 mmol/L (ref 135–145)

## 2017-03-09 LAB — PROTIME-INR
INR: 2.8
PROTHROMBIN TIME: 29.3 s — AB (ref 11.4–15.2)

## 2017-03-09 MED ORDER — WARFARIN SODIUM 1 MG PO TABS
1.0000 mg | ORAL_TABLET | Freq: Once | ORAL | Status: AC
  Administered 2017-03-09: 1 mg via ORAL
  Filled 2017-03-09: qty 1

## 2017-03-09 MED ORDER — POTASSIUM CHLORIDE CRYS ER 20 MEQ PO TBCR
20.0000 meq | EXTENDED_RELEASE_TABLET | Freq: Every day | ORAL | Status: DC
  Administered 2017-03-09 – 2017-03-11 (×3): 20 meq via ORAL
  Filled 2017-03-09 (×3): qty 1

## 2017-03-09 NOTE — Progress Notes (Signed)
ANTICOAGULATION CONSULT NOTE   Pharmacy Consult for Coumadin Indication: atrial fibrillation  Allergies  Allergen Reactions  . No Known Allergies    Patient Measurements: Height:  (162.6 cm) Weight: 158 lb 11.7 oz (72 kg) IBW/kg (Calculated) : 59.2  Vital Signs: Temp: 97.8 F (36.6 C) (09/12 0548) Temp Source: Oral (09/12 0548) BP: 184/95 (09/12 0548) Pulse Rate: 92 (09/12 0548)  Labs:  Recent Labs  03/07/17 1626 03/08/17 0503 03/09/17 0413  HGB 12.2*  --   --   HCT 37.6*  --   --   PLT 165  --   --   LABPROT 26.3*  --  29.3*  INR 2.44  --  2.80  CREATININE 0.92 1.02 1.02   Estimated Creatinine Clearance: 41.2 mL/min (by C-G formula based on SCr of 1.02 mg/dL).  Medical History: Past Medical History:  Diagnosis Date  . Anemia    H&H of 12.4/38.5 with MCV of 110 in /2012  . Arthritis   . Atrial fibrillation, chronic (HCC)    Permanent atrial fibrillation  . Cerebrovascular disease    Left posterior parietal infarction  . Chronic anticoagulation    H&H of 12.4/38.5 in 05/2011; negative stool Hemoccults in 05/2009  . Deep vein thrombosis (DVT) (HCC)   . Dysrhythmia   . Fasting hyperglycemia    Glucose values of 70-135 in 2010-2012  . GERD (gastroesophageal reflux disease)   . Herpes zoster 2011  . History of kidney stones   . Hyperlipidemia    Lipid profile in 05/2011:138, 120, 34, 80  . Hypertension    Lab 05/2011: Normal CMet except glucose of 121 and creatinine of 1.37  . Pulmonary embolism (HCC)   . Stroke (HCC)   . TOTAL KNEE REVISIONLeft    Medications:  Prescriptions Prior to Admission  Medication Sig Dispense Refill Last Dose  . allopurinol (ZYLOPRIM) 300 MG tablet Take 300 mg by mouth daily with supper.    03/07/2017 at Unknown time  . furosemide (LASIX) 40 MG tablet TAKE 1/2 TABLET BY MOUTH DAILY. 45 tablet 2 03/07/2017 at Unknown time  . HYDROcodone-acetaminophen (NORCO/VICODIN) 5-325 MG tablet Take 1-2 tablets by mouth every 4 (four)  hours as needed for moderate pain. 30 tablet 0 unknown  . lisinopril (PRINIVIL,ZESTRIL) 40 MG tablet TAKE ONE TABLET BY MOUTH DAILY. (Patient taking differently: TAKE ONE TABLET BY MOUTH at bedtime) 30 tablet 11 03/07/2017 at Unknown time  . metoprolol (LOPRESSOR) 50 MG tablet TAKE (1) TABLET BY MOUTH TWICE DAILY. 60 tablet 11 03/07/2017 at 2200  . Multiple Vitamin (MULTIVITAMIN) tablet Take 1 tablet by mouth daily.     03/07/2017 at Unknown time  . Multiple Vitamins-Minerals (PRESERVISION AREDS PO) Take 1 tablet by mouth 2 (two) times daily.    03/07/2017 at Unknown time  . pantoprazole (PROTONIX) 40 MG tablet Take 40 mg by mouth daily.   unknown  . polyethylene glycol powder (GLYCOLAX/MIRALAX) powder Take 17 g by mouth at bedtime.  0 unknown  . potassium chloride (K-DUR) 10 MEQ tablet TAKE ONE TABLET BY MOUTH DAILY. 30 tablet 11 03/07/2017 at Unknown time  . simvastatin (ZOCOR) 40 MG tablet TAKE (1) TABLET BY MOUTH AT BEDTIME FOR CHOLESTEROL. 30 tablet 3 03/07/2017 at Unknown time  . tamsulosin (FLOMAX) 0.4 MG CAPS capsule Take 0.4 mg by mouth daily.  11 03/07/2017 at Unknown time  . warfarin (COUMADIN) 2.5 MG tablet Take 2.5 mg by mouth daily. Except on Mondays   03/07/2017 at Unknown time   Assessment: 83  yo male admitted with cellulitis. He is seen in the anticoagulation clinic and on chronic coumadin for atrial fibrillation. Pharmacy asked to continue coumadin and dosing while inpatient. INR is therapeutic on high end of range.  Goal of Therapy:  INR 2-3 Monitor platelets by anticoagulation protocol: Yes   Plan:  Coumadin 1mg  today Daily PT-INR Monitor for S/S of bleeding  Valrie HartScott Annalise Mcdiarmid, PharmD Clinical Pharmacist Pager:  [phone] 03/09/2017   03/09/2017,7:54 AM

## 2017-03-09 NOTE — NC FL2 (Signed)
[place] MEDICAID FL2 LEVEL OF CARE SCREENING TOOL     IDENTIFICATION  Patient Name: James [name] Birthdate: Jan 18, 1922 Sex: male Admission Date (Current Location): 03/07/2017  [facility] [place]   Facility and Address:  [facility],  [address], [place] [id number]      Provider Number: [id number]  Attending Physician Name and Address:  Kari BaarsHawkins, Edward, MD  Relative Name and Phone Number:       Current Level of Care: Hospital Recommended Level of Care: Skilled Nursing Facility Prior Approval Number:    Date Approved/Denied:   PASRR Number: [id number] [phone] ([id number])  Discharge Plan: SNF    Current Diagnoses: Patient Active Problem List   Diagnosis Date Noted  . Skin ulcer of left heel with fat layer exposed (HCC)   . Cellulitis and abscess of left leg 03/07/2017  . CAP (community acquired pneumonia) 11/03/2016  . Pressure injury of skin 11/02/2016  . Pelvis fracture (HCC) 11/02/2016  . Pelvic fracture (HCC) 11/01/2016  . Dysphagia 07/26/2016  . Systolic murmur 02/25/2016  . 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 S/P total knee replacement using cement 03/18/2015  . Encounter for therapeutic drug monitoring 08/08/2013  . Atrial fibrillation, chronic (HCC)   . Anemia   . Hypertension   . CKD (chronic kidney disease), stage III   . Hyperlipidemia   . Cerebrovascular disease   . Chronic anticoagulation   . Fasting hyperglycemia   . HERPES ZOSTER 06/18/2009  . Gout 06/16/2009    Orientation RESPIRATION BLADDER Height & Weight     Self, Time, Place, Situation  Normal Incontinent Weight: 158 lb 11.7 oz (72 kg) Height:  5\' 4"  (162.6 cm)  BEHAVIORAL SYMPTOMS/MOOD NEUROLOGICAL BOWEL NUTRITION STATUS      Continent Diet (Heart Healthy)  AMBULATORY STATUS COMMUNICATION OF NEEDS Skin   Limited Assist Verbally PU Stage and Appropriate Care (State II Sacrum; ulceration left heel)                        Personal Care Assistance Level of Assistance  Bathing, Dressing, Feeding Bathing Assistance: Limited assistance   Dressing Assistance: Limited assistance     Functional Limitations Info  Sight, Hearing, Speech Sight Info: Adequate Hearing Info: Adequate Speech Info: Adequate    SPECIAL CARE FACTORS FREQUENCY  PT (By licensed PT)     PT Frequency: 5x/week              Contractures Contractures Info: Not present    Additional Factors Info  Allergies, Code Status Code Status Info: DNR Allergies Info: NKA           Current Medications (03/09/2017):  This is the current hospital active medication list Current Facility-Administered Medications  Medication Dose Route Frequency Provider Last Rate Last Dose  . acetaminophen (TYLENOL) tablet 650 mg  650 mg Oral Q6H PRN Kari BaarsHawkins, Edward, MD       Or  . acetaminophen (TYLENOL) suppository 650 mg  650 mg Rectal Q6H PRN Kari BaarsHawkins, Edward, MD      . allopurinol (ZYLOPRIM) tablet 300 mg  300 mg Oral Q supper Kari BaarsHawkins, Edward, MD   300 mg at 03/08/17 1624  . collagenase (SANTYL) ointment   Topical Daily Kari BaarsHawkins, Edward, MD      . furosemide (LASIX) tablet 20 mg  20 mg Oral Daily Kari BaarsHawkins, Edward, MD   20 mg at 03/09/17 0956  . HYDROcodone-acetaminophen (NORCO/VICODIN) 5-325 MG per tablet 1-2 tablet  1-2 tablet Oral Q4H PRN Kari Baars, MD   2 tablet at 03/07/17 1626  . lisinopril (PRINIVIL,ZESTRIL) tablet 40 mg  40 mg Oral Daily Kari Baars, MD   40 mg at 03/09/17 0955  . metoprolol tartrate (LOPRESSOR) tablet 50 mg  50 mg Oral BID Kari Baars, MD   50 mg at 03/09/17 0956  . multivitamin-lutein (OCUVITE-LUTEIN) capsule 1 capsule  1 capsule Oral BID Kari Baars, MD   1 capsule at 03/09/17 0956  . ondansetron (ZOFRAN) tablet 4 mg  4 mg Oral Q6H PRN Kari Baars, MD       Or  . ondansetron [facility]) injection 4 mg  4 mg Intravenous Q6H PRN Kari Baars, MD      . pantoprazole (PROTONIX) EC tablet 40 mg  40 mg Oral  Daily Kari Baars, MD   40 mg at 03/09/17 0955  . piperacillin-tazobactam (ZOSYN) IVPB 3.375 g  3.375 g Intravenous Louis Matte, MD   Stopped at 03/09/17 1009  . polyethylene glycol (MIRALAX / GLYCOLAX) packet 17 g  17 g Oral QHS Kari Baars, MD   17 g at 03/08/17 2224  . potassium chloride SA (K-DUR,KLOR-CON) CR tablet 20 mEq  20 mEq Oral Daily Kari Baars, MD   20 mEq at 03/09/17 0956  . simvastatin (ZOCOR) tablet 20 mg  20 mg Oral q1800 Kari Baars, MD   20 mg at 03/08/17 1806  . sodium chloride flush (NS) 0.9 % injection 3 mL  3 mL Intravenous Q12H Kari Baars, MD   3 mL at 03/09/17 1223  . tamsulosin (FLOMAX) capsule 0.4 mg  0.4 mg Oral Daily Kari Baars, MD   0.4 mg at 03/09/17 0956  . warfarin (COUMADIN) tablet 1 mg  1 mg Oral Once Kari Baars, MD      . Warfarin - Pharmacist Dosing Inpatient   Does not apply q1800 Kari Baars, MD         Discharge Medications: Please see discharge summary for a list of discharge medications.  Relevant Imaging Results:  Relevant Lab Results:   Additional Information SSN [id number] Juleen China, LCSW

## 2017-03-09 NOTE — Consult Note (Addendum)
Reason for Consult: Left posterior heel ulceration  Referring Physician: Dr. Luan Pulling, MD   James [name] is an 83 y.o. male.  HPI: [name] is a pleasant man with a history of A fib and PE on coumadin, stroke GERD, HLD, HTN, who has been dealing with a chronic left heel ulcer for several weeks/months based on his report and report from his wife. He has been at home with his elderly wife after coming home from a care facility and she has been helping to take care of the wound with the help of home health agency.   The wife reports that the patient walks minimally, mostly to the restroom with his walker, and stays in bed most of the day or in a wheelchair.  He is becoming more debilitated and was seen in Dr. Kathaleen Grinder office to evaluate the heel and then due to the foul odor and medical issues he was admitted to the hospital.    An MRI revealed osteo of the heel and doppler studies demonstrate non compressible vessels with falsely elevated ABIs. He has no formal history of any diabetes.   Past Medical History:  Diagnosis Date  . Anemia    H&H of 12.4/38.5 with MCV of 110 in /2012  . Arthritis   . Atrial fibrillation, chronic (HCC)    Permanent atrial fibrillation  . Cerebrovascular disease    Left posterior parietal infarction  . Chronic anticoagulation    H&H of 12.4/38.5 in 05/2011; negative stool Hemoccults in 05/2009  . Deep vein thrombosis (DVT) ([place])   . Dysrhythmia   . Fasting hyperglycemia    Glucose values of 70-135 in 2010-2012  . GERD (gastroesophageal reflux disease)   . Herpes zoster 2011  . History of kidney stones   . Hyperlipidemia    Lipid profile in 05/2011:138, 120, 34, 80  . Hypertension    Lab 05/2011: Normal CMet except glucose of 121 and creatinine of 1.37  . Pulmonary embolism ([place])   . Stroke ([place])   . TOTAL KNEE REVISIONLeft     Past Surgical History:  Procedure Laterality Date  . APPENDECTOMY    . CHOLECYSTECTOMY    . COLONOSCOPY    . ESOPHAGEAL  DILATION N/A 07/30/2016   Procedure: ESOPHAGEAL DILATION;  Surgeon: Rogene Houston, MD;  [facility];  Service: Endoscopy;  Laterality: N/A;  . ESOPHAGOGASTRODUODENOSCOPY N/A 07/30/2016   Procedure: ESOPHAGOGASTRODUODENOSCOPY (EGD);  Surgeon: Rogene Houston, MD;  Location: [facility];  Service: Endoscopy;  Laterality: N/A;  730  . EYE SURGERY Bilateral   . HEMORRHOID SURGERY    . HIP FRACTURE SURGERY    . JOINT REPLACEMENT    . RENAL ARTERY STENT    . TOTAL KNEE REVISION Left 03/18/2015   Procedure: TOTAL KNEE REVISION;  Surgeon: Garald Balding, MD;  Location: [facility];  Service: Orthopedics;  Laterality: Left;  . WISDOM TOOTH EXTRACTION      Family History  Problem Relation Age of Onset  . Cancer Mother     Social History:  reports that he has never smoked. He has never used smokeless tobacco. He reports that he does not drink alcohol or use drugs.  Allergies:  Allergies  Allergen Reactions  . No Known Allergies     Scheduled Meds: . allopurinol  300 mg Oral Q supper  . collagenase   Topical Daily  . furosemide  20 mg Oral Daily  . lisinopril  40 mg Oral Daily  . metoprolol tartrate  50 mg Oral BID  . multivitamin-lutein  1 capsule Oral BID  . pantoprazole  40 mg Oral Daily  . polyethylene glycol  17 g Oral QHS  . potassium chloride  20 mEq Oral Daily  . simvastatin  20 mg Oral q1800  . sodium chloride flush  3 mL Intravenous Q12H  . tamsulosin  0.4 mg Oral Daily  . warfarin  1 mg Oral Once  . Warfarin - Pharmacist Dosing Inpatient   Does not apply q1800   Continuous Infusions: . piperacillin-tazobactam (ZOSYN)  IV 3.375 g (03/09/17 7846)     Results for orders placed or performed during the hospital encounter of 03/07/17 (from the past 48 hour(s))  Comprehensive metabolic panel     Status: Abnormal   Collection Time: 03/07/17  4:26 PM  Result Value Ref Range   Sodium 135 135 - 145 mmol/L   Potassium 4.2 3.5 - 5.1 mmol/L   Chloride 98 (L) 101 - 111  mmol/L   CO2 28 22 - 32 mmol/L   Glucose, Bld 138 (H) 65 - 99 mg/dL   BUN 19 6 - 20 mg/dL   Creatinine, Ser 0.92 0.61 - 1.24 mg/dL   Calcium 8.9 8.9 - 10.3 mg/dL   Total Protein 6.9 6.5 - 8.1 g/dL   Albumin 2.9 (L) 3.5 - 5.0 g/dL   AST 40 15 - 41 U/L   ALT 30 17 - 63 U/L   Alkaline Phosphatase 172 (H) 38 - 126 U/L   Total Bilirubin 0.9 0.3 - 1.2 mg/dL   GFR calc non Af Amer >60 >60 mL/min   GFR calc Af Amer >60 >60 mL/min    Comment: (NOTE) The eGFR has been calculated using the CKD EPI equation. This calculation has not been validated in all clinical situations. eGFR's persistently <60 mL/min signify possible Chronic Kidney Disease.    Anion gap 9 5 - 15  CBC WITH DIFFERENTIAL     Status: Abnormal   Collection Time: 03/07/17  4:26 PM  Result Value Ref Range   WBC 7.8 4.0 - 10.5 K/uL   RBC 3.99 (L) 4.22 - 5.81 MIL/uL   Hemoglobin 12.2 (L) 13.0 - 17.0 g/dL   HCT 37.6 (L) 39.0 - 52.0 %   MCV 94.2 78.0 - 100.0 fL   MCH 30.6 26.0 - 34.0 pg   MCHC 32.4 30.0 - 36.0 g/dL   RDW 15.1 11.5 - 15.5 %   Platelets 165 150 - 400 K/uL   Neutrophils Relative % 58 %   Neutro Abs 4.5 1.7 - 7.7 K/uL   Lymphocytes Relative 20 %   Lymphs Abs 1.5 0.7 - 4.0 K/uL   Monocytes Relative 15 %   Monocytes Absolute 1.2 (H) 0.1 - 1.0 K/uL   Eosinophils Relative 6 %   Eosinophils Absolute 0.5 0.0 - 0.7 K/uL   Basophils Relative 1 %   Basophils Absolute 0.0 0.0 - 0.1 K/uL  Protime-INR     Status: Abnormal   Collection Time: 03/07/17  4:26 PM  Result Value Ref Range   Prothrombin Time 26.3 (H) 11.4 - 15.2 seconds   INR 2.44   Culture, blood (routine x 2)     Status: None (Preliminary result)   Collection Time: 03/07/17  4:26 PM  Result Value Ref Range   Specimen Description BLOOD LEFT HAND    Special Requests      BOTTLES DRAWN AEROBIC AND ANAEROBIC Blood Culture results may not be optimal due to an inadequate volume of blood  received in culture bottles   Culture NO GROWTH < 24 HOURS    Report  Status PENDING   Culture, blood (routine x 2)     Status: None (Preliminary result)   Collection Time: 03/07/17  4:26 PM  Result Value Ref Range   Specimen Description RIGHT ANTECUBITAL    Special Requests      BOTTLES DRAWN AEROBIC AND ANAEROBIC Blood Culture results may not be optimal due to an inadequate volume of blood received in culture bottles   Culture NO GROWTH < 24 HOURS    Report Status PENDING   Aerobic Culture (superficial specimen)     Status: None (Preliminary result)   Collection Time: 03/07/17  4:50 PM  Result Value Ref Range   Specimen Description ABSCESS    Special Requests NONE    Gram Stain      NO WBC SEEN FEW GRAM NEGATIVE RODS FEW GRAM POSITIVE COCCI    Culture      CULTURE REINCUBATED FOR BETTER GROWTH Performed at [facility], [place] [address] [place], [place] [address]    Report Status PENDING   Basic metabolic panel     Status: Abnormal   Collection Time: 03/08/17  5:03 AM  Result Value Ref Range   Sodium 134 (L) 135 - 145 mmol/L   Potassium 3.6 3.5 - 5.1 mmol/L   Chloride 97 (L) 101 - 111 mmol/L   CO2 29 22 - 32 mmol/L   Glucose, Bld 89 65 - 99 mg/dL   BUN 18 6 - 20 mg/dL   Creatinine, Ser 1.02 0.61 - 1.24 mg/dL   Calcium 8.7 (L) 8.9 - 10.3 mg/dL   GFR calc non Af Amer >60 >60 mL/min   GFR calc Af Amer >60 >60 mL/min    Comment: (NOTE) The eGFR has been calculated using the CKD EPI equation. This calculation has not been validated in all clinical situations. eGFR's persistently <60 mL/min signify possible Chronic Kidney Disease.    Anion gap 8 5 - 15  Basic metabolic panel     Status: Abnormal   Collection Time: 03/09/17  4:13 AM  Result Value Ref Range   Sodium 135 135 - 145 mmol/L   Potassium 3.3 (L) 3.5 - 5.1 mmol/L   Chloride 97 (L) 101 - 111 mmol/L   CO2 31 22 - 32 mmol/L   Glucose, Bld 87 65 - 99 mg/dL   BUN 16 6 - 20 mg/dL   Creatinine, Ser 1.02 0.61 - 1.24 mg/dL   Calcium 8.8 (L) 8.9 - 10.3 mg/dL   GFR calc non Af Amer  >60 >60 mL/min   GFR calc Af Amer >60 >60 mL/min    Comment: (NOTE) The eGFR has been calculated using the CKD EPI equation. This calculation has not been validated in all clinical situations. eGFR's persistently <60 mL/min signify possible Chronic Kidney Disease.    Anion gap 7 5 - 15  Protime-INR     Status: Abnormal   Collection Time: 03/09/17  4:13 AM  Result Value Ref Range   Prothrombin Time 29.3 (H) 11.4 - 15.2 seconds   INR 2.80     Mr Foot Left W Wo Contrast  Result Date: 03/08/2017 CLINICAL DATA:  Left heel wound.  Evaluate for osteomyelitis. EXAM: MRI OF THE LEFT ANKLE WITHOUT AND WITH CONTRAST TECHNIQUE: Multiplanar, multisequence MR imaging of the left ankle was performed both before and after administration of intravenous contrast. CONTRAST:  68m MULTIHANCE GADOBENATE DIMEGLUMINE 529 MG/ML IV SOLN  COMPARISON:  None. FINDINGS: TENDONS Peroneal: Intact and normally positioned. Posteromedial: Intact and normally positioned. Anterior: Intact and normally positioned. Achilles: Intact.  Mild tendinosis. Plantar Fascia: Intact. LIGAMENTS Lateral: The anterior and posterior talofibular and calcaneofibular ligaments are intact. Medial: The deltoid and visualized portions of the spring ligament appear intact. CARTILAGE AND BONES Ankle Joint: Trace ankle joint effusion. The talar dome and tibial plafond are intact. Subtalar Joints/Sinus Tarsi: Trace posterior subtalar joint effusion. Normal signal within the sinus tarsi. Bones: Deep to the soft tissue ulceration, there is abnormal increased T2 marrow signal with corresponding T1 marrow hypointensity and cortical destruction of the posterior calcaneus, consistent with osteomyelitis. Bone marrow signal is otherwise unremarkable. Other: Soft tissue ulceration over the posterior heel. Diffuse soft tissue edema about the ankle and hindfoot. No drainable fluid collection. IMPRESSION: 1. Soft tissue ulceration over the posterior heel with underlying  osteomyelitis of the adjacent posterior calcaneus. No drainable fluid collection. Electronically Signed   By: Titus Dubin M.D.   On: 03/08/2017 13:51   US Arterial Abi (screening Lower Extremity)  Result Date: 03/08/2017 CLINICAL DATA:  Nonhealing left heel ulceration with visible osteomyelitis by MRI EXAM: NONINVASIVE PHYSIOLOGIC VASCULAR STUDY OF BILATERAL LOWER EXTREMITIES TECHNIQUE: Evaluation of both lower extremities were performed at rest, including calculation of ankle-brachial indices with single level Doppler, pressure and pulse volume recording. COMPARISON:  03/08/2017 FINDINGS: Right ABI:  1.16 Left ABI:  0.72 Right Lower Extremity: Irregular monophasic flow. Dorsalis pedis vasculature is noncompressible. Right ABI is likely overestimated secondary to peripheral vascular disease. Left Lower Extremity: Irregular and monophasic flow. Left dorsalis pedis vasculature is also noncompressible. Decreased ABI as above. IMPRESSION: Irregular monophasic flow at the ankle vasculature bilaterally suggesting moderate range peripheral arterial disease. Right ABI is likely falsely elevated. Consider further evaluation with vascular consultation and CTA extremity runoff Electronically Signed   By: Jerilynn Mages.  Shick M.D.   On: 03/08/2017 14:48    ROS:  + left posterior heel ulcer + weight loss/ anorexia  No chest pain, SOB, fever or chills Review of Systems  All other systems reviewed and are negative.   Blood pressure 138/69, pulse 66, temperature 97.8 F (36.6 C), temperature source Oral, resp. rate 18, height '5\' 4"'$  (1.626 m), weight 158 lb 11.7 oz (72 kg), SpO2 96 %. Physical Exam  Constitutional: He is well-developed, well-nourished, and in no distress.  HENT:  Head: Normocephalic and atraumatic.  Eyes: Pupils are equal, round, and reactive to light.  Cardiovascular: Normal rate.   Pulmonary/Chest: Effort normal.  Abdominal: Soft. He exhibits no distension. There is no tenderness.  Scar RLQ and  RUQ  Musculoskeletal: He exhibits no edema.  Neurological:  Oriented to self, not oriented to time (8786) or location  Skin: Skin is warm.  Shins without hair, some skin discoloration bilaterally, right 2nd toe with small scab, left posterior heel with 2X3cm ulcer with black sloughing eschar, no erythema surrounding  Sacrum- with non-blanchable erythema, no skin breaks   Wound debridement performed with scissors.  Excisional debridement performed to adipose soft tissue. No bone debrided. Predebridement-    Post debridement     Assessment/Plan: Mr. Keltz is a 83 yo with increasing weakness and debilitation that presents with a chronic left heel ulcer that has failed to heel despite efforts for dressing changes and off loading by the wife and home health. He has been admitted and has been found to have osteomyelitis associated with this wound.   -Local wound debridement was performed at the bedside,  and he tolerated this well. Removing the sloughing eschar.  -Continue daily santyl application to the base, should work better after eschar removed  -BID damp to dry dressing changes -Can reassess as needed for further local debridement, limited due to coumadin use and sensation -Off load of the left heel  -IV Antibiotics for osteo per Dr. Luan Pulling   Dr. Curlene Labrum, MD

## 2017-03-09 NOTE — Clinical Social Work Note (Signed)
Clinical Social Work Assessment  Patient Details  Name: James [name] MRN: [id number] Date of Birth: Jan 05, 1922  Date of referral:  03/09/17               Reason for consult:  Discharge Planning                Permission sought to share information with:    Permission granted to share information::     Name::        Agency::     Relationship::     Contact Information:     Housing/Transportation Living arrangements for the past 2 months:  Single Family Home Source of Information:  Patient Patient Interpreter Needed:  None Criminal Activity/Legal Involvement Pertinent to Current Situation/Hospitalization:  No - Comment as needed Significant Relationships:  None Lives with:  Spouse Do you feel safe going back to the place where you live?  Yes Need for family participation in patient care:  Yes (Comment)  Care giving concerns:  ADLs are assisted by son and nephew.    Social Worker assessment / plan:  Patient lives in the home with his wife and ambulates with a walker at baseline. Patient stated that he typcially sits in a wheelchair. Patient stated "I don't know" in regards to going to SNF. Patient stated "I'm about at the end of a journey." LCSW left a message for patient's wife requesting return contact in regards to discharge planning.   Employment status:  Retired Database administratornsurance information:  Managed Medicare PT Recommendations:  Skilled Nursing Facility Information / Referral to community resources:  Skilled Nursing Facility  Patient/Family's Response to care:  Patient is unsure as to whether he wants to go to treatment.   Patient/Family's Understanding of and Emotional Response to Diagnosis, Current Treatment, and Prognosis:  Patient understands his diagnosis, treatment and prognosis.   Emotional Assessment Appearance:  Appears stated age Attitude/Demeanor/Rapport:    Affect (typically observed):  Calm Orientation:  Oriented to Self, Oriented to Place, Oriented to  Time,  Oriented to Situation Alcohol / Substance use:  Not Applicable Psych involvement (Current and /or in the community):  No (Comment)  Discharge Needs  Concerns to be addressed:  Discharge Planning Concerns Readmission within the last 30 days:  No Current discharge risk:  None Barriers to Discharge:  Insurance Authorization   James [name], James Stemmer D, LCSW 03/09/2017, 2:33 PM

## 2017-03-09 NOTE — Progress Notes (Addendum)
Subjective: He says he feels okay. He's a little bit confused this morning but I don't think much worse than usual. Wound care and PT consultations noted and appreciated. Surgical consultation has been ordered. His arterial Doppler not surprisingly shows that he has some arterial disease in his legs. His MRI is consistent with osteomyelitis in the heel.  Objective: Vital signs in last 24 hours: Temp:  [97.5 F (36.4 C)-98.4 F (36.9 C)] 97.8 F (36.6 C) (09/12 0548) Pulse Rate:  [56-92] 92 (09/12 0548) Resp:  [16-18] 18 (09/12 0548) BP: (144-184)/(71-95) 184/95 (09/12 0548) SpO2:  [96 %] 96 % (09/12 0548) Weight change:  Last BM Date: 03/07/17  Intake/Output from previous day: 09/11 0701 - 09/12 0700 In: 360 [P.O.:360] Out: 1175 [Urine:1175]  PHYSICAL EXAM General appearance: alert, cooperative and no distress Resp: clear to auscultation bilaterally Cardio: irregularly irregular rhythm GI: soft, non-tender; bowel sounds normal; no masses,  no organomegaly Extremities: The ulceration on his heel looks about the same. There is still some foul odor and some drainage He has a separate skin lesion on his sacrum  Lab Results:  Results for orders placed or performed during the hospital encounter of 03/07/17 (from the past 48 hour(s))  Comprehensive metabolic panel     Status: Abnormal   Collection Time: 03/07/17  4:26 PM  Result Value Ref Range   Sodium 135 135 - 145 mmol/L   Potassium 4.2 3.5 - 5.1 mmol/L   Chloride 98 (L) 101 - 111 mmol/L   CO2 28 22 - 32 mmol/L   Glucose, Bld 138 (H) 65 - 99 mg/dL   BUN 19 6 - 20 mg/dL   Creatinine, Ser 0.92 0.61 - 1.24 mg/dL   Calcium 8.9 8.9 - 10.3 mg/dL   Total Protein 6.9 6.5 - 8.1 g/dL   Albumin 2.9 (L) 3.5 - 5.0 g/dL   AST 40 15 - 41 U/L   ALT 30 17 - 63 U/L   Alkaline Phosphatase 172 (H) 38 - 126 U/L   Total Bilirubin 0.9 0.3 - 1.2 mg/dL   GFR calc non Af Amer >60 >60 mL/min   GFR calc Af Amer >60 >60 mL/min    Comment:  (NOTE) The eGFR has been calculated using the CKD EPI equation. This calculation has not been validated in all clinical situations. eGFR's persistently <60 mL/min signify possible Chronic Kidney Disease.    Anion gap 9 5 - 15  CBC WITH DIFFERENTIAL     Status: Abnormal   Collection Time: 03/07/17  4:26 PM  Result Value Ref Range   WBC 7.8 4.0 - 10.5 K/uL   RBC 3.99 (L) 4.22 - 5.81 MIL/uL   Hemoglobin 12.2 (L) 13.0 - 17.0 g/dL   HCT 37.6 (L) 39.0 - 52.0 %   MCV 94.2 78.0 - 100.0 fL   MCH 30.6 26.0 - 34.0 pg   MCHC 32.4 30.0 - 36.0 g/dL   RDW 15.1 11.5 - 15.5 %   Platelets 165 150 - 400 K/uL   Neutrophils Relative % 58 %   Neutro Abs 4.5 1.7 - 7.7 K/uL   Lymphocytes Relative 20 %   Lymphs Abs 1.5 0.7 - 4.0 K/uL   Monocytes Relative 15 %   Monocytes Absolute 1.2 (H) 0.1 - 1.0 K/uL   Eosinophils Relative 6 %   Eosinophils Absolute 0.5 0.0 - 0.7 K/uL   Basophils Relative 1 %   Basophils Absolute 0.0 0.0 - 0.1 K/uL  Protime-INR     Status: Abnormal  Collection Time: 03/07/17  4:26 PM  Result Value Ref Range   Prothrombin Time 26.3 (H) 11.4 - 15.2 seconds   INR 2.44   Culture, blood (routine x 2)     Status: None (Preliminary result)   Collection Time: 03/07/17  4:26 PM  Result Value Ref Range   Specimen Description BLOOD LEFT HAND    Special Requests      BOTTLES DRAWN AEROBIC AND ANAEROBIC Blood Culture results may not be optimal due to an inadequate volume of blood received in culture bottles   Culture NO GROWTH < 24 HOURS    Report Status PENDING   Culture, blood (routine x 2)     Status: None (Preliminary result)   Collection Time: 03/07/17  4:26 PM  Result Value Ref Range   Specimen Description RIGHT ANTECUBITAL    Special Requests      BOTTLES DRAWN AEROBIC AND ANAEROBIC Blood Culture results may not be optimal due to an inadequate volume of blood received in culture bottles   Culture NO GROWTH < 24 HOURS    Report Status PENDING   Aerobic Culture (superficial  specimen)     Status: None (Preliminary result)   Collection Time: 03/07/17  4:50 PM  Result Value Ref Range   Specimen Description ABSCESS    Special Requests NONE    Gram Stain      NO WBC SEEN FEW GRAM NEGATIVE RODS FEW GRAM POSITIVE COCCI    Culture      CULTURE REINCUBATED FOR BETTER GROWTH Performed at [facility], [place] [address], [place], [place] [address]    Report Status PENDING   Basic metabolic panel     Status: Abnormal   Collection Time: 03/08/17  5:03 AM  Result Value Ref Range   Sodium 134 (L) 135 - 145 mmol/L   Potassium 3.6 3.5 - 5.1 mmol/L   Chloride 97 (L) 101 - 111 mmol/L   CO2 29 22 - 32 mmol/L   Glucose, Bld 89 65 - 99 mg/dL   BUN 18 6 - 20 mg/dL   Creatinine, Ser 1.02 0.61 - 1.24 mg/dL   Calcium 8.7 (L) 8.9 - 10.3 mg/dL   GFR calc non Af Amer >60 >60 mL/min   GFR calc Af Amer >60 >60 mL/min    Comment: (NOTE) The eGFR has been calculated using the CKD EPI equation. This calculation has not been validated in all clinical situations. eGFR's persistently <60 mL/min signify possible Chronic Kidney Disease.    Anion gap 8 5 - 15  Basic metabolic panel     Status: Abnormal   Collection Time: 03/09/17  4:13 AM  Result Value Ref Range   Sodium 135 135 - 145 mmol/L   Potassium 3.3 (L) 3.5 - 5.1 mmol/L   Chloride 97 (L) 101 - 111 mmol/L   CO2 31 22 - 32 mmol/L   Glucose, Bld 87 65 - 99 mg/dL   BUN 16 6 - 20 mg/dL   Creatinine, Ser 1.02 0.61 - 1.24 mg/dL   Calcium 8.8 (L) 8.9 - 10.3 mg/dL   GFR calc non Af Amer >60 >60 mL/min   GFR calc Af Amer >60 >60 mL/min    Comment: (NOTE) The eGFR has been calculated using the CKD EPI equation. This calculation has not been validated in all clinical situations. eGFR's persistently <60 mL/min signify possible Chronic Kidney Disease.    Anion gap 7 5 - 15  Protime-INR     Status: Abnormal  Collection Time: 03/09/17  4:13 AM  Result Value Ref Range   Prothrombin Time 29.3 (H) 11.4 - 15.2 seconds   INR  2.80     ABGS No results for input(s): PHART, PO2ART, TCO2, HCO3 in the last 72 hours.  Invalid input(s): PCO2 CULTURES Recent Results (from the past 240 hour(s))  Culture, blood (routine x 2)     Status: None (Preliminary result)   Collection Time: 03/07/17  4:26 PM  Result Value Ref Range Status   Specimen Description BLOOD LEFT HAND  Final   Special Requests   Final    BOTTLES DRAWN AEROBIC AND ANAEROBIC Blood Culture results may not be optimal due to an inadequate volume of blood received in culture bottles   Culture NO GROWTH < 24 HOURS  Final   Report Status PENDING  Incomplete  Culture, blood (routine x 2)     Status: None (Preliminary result)   Collection Time: 03/07/17  4:26 PM  Result Value Ref Range Status   Specimen Description RIGHT ANTECUBITAL  Final   Special Requests   Final    BOTTLES DRAWN AEROBIC AND ANAEROBIC Blood Culture results may not be optimal due to an inadequate volume of blood received in culture bottles   Culture NO GROWTH < 24 HOURS  Final   Report Status PENDING  Incomplete  Aerobic Culture (superficial specimen)     Status: None (Preliminary result)   Collection Time: 03/07/17  4:50 PM  Result Value Ref Range Status   Specimen Description ABSCESS  Final   Special Requests NONE  Final   Gram Stain   Final    NO WBC SEEN FEW GRAM NEGATIVE RODS FEW GRAM POSITIVE COCCI    Culture   Final    CULTURE REINCUBATED FOR BETTER GROWTH Performed at [facility], [place] [address] [place], [place] [address]    Report Status PENDING  Incomplete   Studies/Results: Mr Foot Left W Wo Contrast  Result Date: 03/08/2017 CLINICAL DATA:  Left heel wound.  Evaluate for osteomyelitis. EXAM: MRI OF THE LEFT ANKLE WITHOUT AND WITH CONTRAST TECHNIQUE: Multiplanar, multisequence MR imaging of the left ankle was performed both before and after administration of intravenous contrast. CONTRAST:  80m MULTIHANCE GADOBENATE DIMEGLUMINE 529 MG/ML IV SOLN COMPARISON:   None. FINDINGS: TENDONS Peroneal: Intact and normally positioned. Posteromedial: Intact and normally positioned. Anterior: Intact and normally positioned. Achilles: Intact.  Mild tendinosis. Plantar Fascia: Intact. LIGAMENTS Lateral: The anterior and posterior talofibular and calcaneofibular ligaments are intact. Medial: The deltoid and visualized portions of the spring ligament appear intact. CARTILAGE AND BONES Ankle Joint: Trace ankle joint effusion. The talar dome and tibial plafond are intact. Subtalar Joints/Sinus Tarsi: Trace posterior subtalar joint effusion. Normal signal within the sinus tarsi. Bones: Deep to the soft tissue ulceration, there is abnormal increased T2 marrow signal with corresponding T1 marrow hypointensity and cortical destruction of the posterior calcaneus, consistent with osteomyelitis. Bone marrow signal is otherwise unremarkable. Other: Soft tissue ulceration over the posterior heel. Diffuse soft tissue edema about the ankle and hindfoot. No drainable fluid collection. IMPRESSION: 1. Soft tissue ulceration over the posterior heel with underlying osteomyelitis of the adjacent posterior calcaneus. No drainable fluid collection. Electronically Signed   By: WTitus DubinM.D.   On: 03/08/2017 13:51   UKoreaArterial Abi (screening Lower Extremity)  Result Date: 03/08/2017 CLINICAL DATA:  Nonhealing left heel ulceration with visible osteomyelitis by MRI EXAM: NONINVASIVE PHYSIOLOGIC VASCULAR STUDY OF BILATERAL LOWER EXTREMITIES TECHNIQUE: Evaluation of  both lower extremities were performed at rest, including calculation of ankle-brachial indices with single level Doppler, pressure and pulse volume recording. COMPARISON:  03/08/2017 FINDINGS: Right ABI:  1.16 Left ABI:  0.72 Right Lower Extremity: Irregular monophasic flow. Dorsalis pedis vasculature is noncompressible. Right ABI is likely overestimated secondary to peripheral vascular disease. Left Lower Extremity: Irregular and  monophasic flow. Left dorsalis pedis vasculature is also noncompressible. Decreased ABI as above. IMPRESSION: Irregular monophasic flow at the ankle vasculature bilaterally suggesting moderate range peripheral arterial disease. Right ABI is likely falsely elevated. Consider further evaluation with vascular consultation and CTA extremity runoff Electronically Signed   By: Jerilynn Mages.  Shick M.D.   On: 03/08/2017 14:48    Medications:  Prior to Admission:  Prescriptions Prior to Admission  Medication Sig Dispense Refill Last Dose  . allopurinol (ZYLOPRIM) 300 MG tablet Take 300 mg by mouth daily with supper.    03/07/2017 at Unknown time  . furosemide (LASIX) 40 MG tablet TAKE 1/2 TABLET BY MOUTH DAILY. 45 tablet 2 03/07/2017 at Unknown time  . HYDROcodone-acetaminophen (NORCO/VICODIN) 5-325 MG tablet Take 1-2 tablets by mouth every 4 (four) hours as needed for moderate pain. 30 tablet 0 unknown  . lisinopril (PRINIVIL,ZESTRIL) 40 MG tablet TAKE ONE TABLET BY MOUTH DAILY. (Patient taking differently: TAKE ONE TABLET BY MOUTH at bedtime) 30 tablet 11 03/07/2017 at Unknown time  . metoprolol (LOPRESSOR) 50 MG tablet TAKE (1) TABLET BY MOUTH TWICE DAILY. 60 tablet 11 03/07/2017 at 2200  . Multiple Vitamin (MULTIVITAMIN) tablet Take 1 tablet by mouth daily.     03/07/2017 at Unknown time  . Multiple Vitamins-Minerals (PRESERVISION AREDS PO) Take 1 tablet by mouth 2 (two) times daily.    03/07/2017 at Unknown time  . pantoprazole (PROTONIX) 40 MG tablet Take 40 mg by mouth daily.   unknown  . polyethylene glycol powder (GLYCOLAX/MIRALAX) powder Take 17 g by mouth at bedtime.  0 unknown  . potassium chloride (K-DUR) 10 MEQ tablet TAKE ONE TABLET BY MOUTH DAILY. 30 tablet 11 03/07/2017 at Unknown time  . simvastatin (ZOCOR) 40 MG tablet TAKE (1) TABLET BY MOUTH AT BEDTIME FOR CHOLESTEROL. 30 tablet 3 03/07/2017 at Unknown time  . tamsulosin (FLOMAX) 0.4 MG CAPS capsule Take 0.4 mg by mouth daily.  11 03/07/2017 at Unknown  time  . warfarin (COUMADIN) 2.5 MG tablet Take 2.5 mg by mouth daily. Except on Mondays   03/07/2017 at Unknown time   Scheduled: . allopurinol  300 mg Oral Q supper  . collagenase   Topical Daily  . furosemide  20 mg Oral Daily  . lisinopril  40 mg Oral Daily  . metoprolol tartrate  50 mg Oral BID  . multivitamin-lutein  1 capsule Oral BID  . pantoprazole  40 mg Oral Daily  . polyethylene glycol  17 g Oral QHS  . potassium chloride  20 mEq Oral Daily  . simvastatin  20 mg Oral q1800  . sodium chloride flush  3 mL Intravenous Q12H  . tamsulosin  0.4 mg Oral Daily  . warfarin  1 mg Oral Once  . Warfarin - Pharmacist Dosing Inpatient   Does not apply q1800   Continuous: . piperacillin-tazobactam (ZOSYN)  IV 3.375 g (03/09/17 0609)  . vancomycin Stopped (03/09/17 0709)   HYW:VPXTGGYIRSWNI **OR** acetaminophen, HYDROcodone-acetaminophen, ondansetron **OR** ondansetron (ZOFRAN) IV  Assesment: He was admitted with a heel ulcer. This looks like he has osteomyelitis by MRI. Telephone consultation with infectious disease has been initiated but not completed yet.  He is known to have chronic atrial fib and he is chronically anticoagulated. He also has a stage II decubitus on his sacrum. He has personal history of stroke and he appears to have peripheral arterial disease from his Doppler. Active Problems:   Atrial fibrillation, chronic (HCC)   Hypertension   CKD (chronic kidney disease), stage III   Cerebrovascular disease   Chronic anticoagulation   Pressure injury of skin   Cellulitis and abscess of left leg    Plan: Continue treatments. I presume he is going to need long-term antibiotics will discuss with infectious disease. Addendum: Discussed with Dr. Baxter Flattery. She suggests because of the concerns about renal dysfunction that we discontinue vancomycin for now since we do not have definite MRSA. She would like to have debridement done if possible. Repeat culture tomorrow after off  vancomycin. The current culture has been reintubated. Further recommendations depending on results   LOS: 2 days   Thamar Holik L 03/09/2017, 8:29 AM

## 2017-03-09 NOTE — Progress Notes (Signed)
Physical Therapy Treatment Patient Details Name: James KindleJames L [name] MRN: [id number] DOB: [birth year] Today's Date: 03/09/2017    History of Present Illness James [name] is a 83yo [facility] after progression of leftheal ulcer, now more painful, odorous, and changes to wound margins consistent with diseas progression. MRI on 9/11 shows findings consistent with calcaneal osteomyelitis. PMH: CVA, AF, recent pelvic fracture, PE, HTN. At baseline, pt performs limited community AMB with RW and is independnet with ADL, however as of recent has required more ssistance for dressing changes, bathing, etc and has not toelrated mobility as well due to wound.     PT Comments    Patient unable to stand or attempt bed<>chair transfers using RW due to BLE weakness, inability to keep body weight off left heel wound, required stand pivot with therapist keeping left heel off to floor to transfer to chair.  Patient tolerated sitting up for > 1 hour before requesting to lie down due to c/o fatigue.  Patient will benefit from continued physical therapy in hospital and recommended venue below to increase strength, balance, endurance for safe ADLs and transfers.   Follow Up Recommendations  SNF;Supervision/Assistance - 24 hour     Equipment Recommendations  None recommended by PT    Recommendations for Other Services       Precautions / Restrictions Precautions Precautions: Fall Precaution Comments: left heel ulcer Restrictions Weight Bearing Restrictions: Yes (NWB LLE due to heel wound, s/p debridemet)    Mobility  Bed Mobility Overal bed mobility: Needs Assistance Bed Mobility: Supine to Sit;Sit to Supine Rolling: Max assist   Supine to sit: Mod assist        Transfers Overall transfer level: Needs assistance Equipment used: 1 person hand held assist Transfers: Stand Pivot Transfers   Stand pivot transfers: Max assist       General transfer comment: Required Max  assist mostly due to keeping left heel off floor and patient becoming anxious during functional mobility  Ambulation/Gait                 Stairs            Wheelchair Mobility    Modified Rankin (Stroke Patients Only)       Balance Overall balance assessment: Needs assistance Sitting-balance support: No upper extremity supported;Feet unsupported Sitting balance-Leahy Scale: Fair     Standing balance support: During functional activity;Bilateral upper extremity supported Standing balance-Leahy Scale: Poor                              Cognition Arousal/Alertness: Awake/alert Behavior During Therapy: WFL for tasks assessed/performed Overall Cognitive Status: Within Functional Limits for tasks assessed                                 General Comments: Patient very hard of hearing, requires repeated verbal cues and becomes frustrated      Exercises General Exercises - Lower Extremity Ankle Circles/Pumps: Seated;AROM;Both;10 reps Long Arc Quad: Seated;AROM;Both;10 reps Hip Flexion/Marching: Seated;AROM;Both;10 reps    General Comments        Pertinent Vitals/Pain Pain Assessment: Faces Faces Pain Scale: Hurts even more Pain Location: left heel Pain Descriptors / Indicators: Pressure;Grimacing Pain Intervention(s): Limited activity within patient's tolerance;Monitored during session    Home Living  Prior Function            PT Goals (current goals can now be found in the care plan section) Acute Rehab PT Goals Patient Stated Goal: Return home with his spouse to assist PT Goal Formulation: With patient/family Time For Goal Achievement: 03/22/17 Potential to Achieve Goals: Fair Progress towards PT goals: Progressing toward goals    Frequency    Min 3X/week      PT Plan Current plan remains appropriate    Co-evaluation              AM-PAC PT "6 Clicks" Daily Activity  Outcome  Measure  Difficulty turning over in bed (including adjusting bedclothes, sheets and blankets)?: Unable Difficulty moving from lying on back to sitting on the side of the bed? : Unable Difficulty sitting down on and standing up from a chair with arms (e.g., wheelchair, bedside commode, etc,.)?: Unable Help needed moving to and from a bed to chair (including a wheelchair)?: A Lot Help needed walking in hospital room?: Total Help needed climbing 3-5 steps with a railing? : Total 6 Click Score: 7    End of Session Equipment Utilized During Treatment: Gait belt Activity Tolerance: Patient limited by fatigue Patient left: in bed;with call bell/phone within reach;with family/visitor present Nurse Communication: Mobility status PT Visit Diagnosis: Unsteadiness on feet (R26.81);Other abnormalities of gait and mobility (R26.89);Muscle weakness (generalized) (M62.81)     Time: 1050-1120 PT Time Calculation (min) (ACUTE ONLY): 30 min  Charges:  $Therapeutic Activity: 23-37 mins                    G Codes:       2:17 PM, Mar 24, 2017 Ocie Bob, MPT Physical Therapist with [facility] [phone] office [phone] mobile phone

## 2017-03-09 NOTE — Clinical Social Work Placement (Signed)
   CLINICAL SOCIAL WORK PLACEMENT  NOTE  Date:  03/09/2017  Patient Details  Name: [name]: [id number] Date of Birth: December 20, 1921  Clinical Social Work is seeking post-discharge placement for this patient at the Skilled  Nursing Facility level of care (*CSW will initial, date and re-position this form in  chart as items are completed):  Yes   Patient/family provided with [facility] Clinical Social Work Department's list of facilities offering this level of care within the geographic area requested by the patient (or if unable, by the patient's family).  Yes   Patient/family informed of their freedom to choose among providers that offer the needed level of care, that participate in Medicare, Medicaid or managed care program needed by the patient, have an available bed and are willing to accept the patient.  Yes   Patient/family informed of '[facility] interest [facility] and [facility], as well as of the fact that they are under no obligation to receive care at these facilities.  PASRR submitted to EDS on       PASRR number received on       Existing PASRR number confirmed on 03/09/17     FL2 transmitted to all facilities in geographic area requested by pt/family on       FL2 transmitted to all facilities within larger geographic area on       Patient informed that his/her managed care company has contracts with or will negotiate with certain facilities, including the following:            Patient/family informed of bed offers received.  Patient chooses bed at       Physician recommends and patient chooses bed at      Patient to be transferred to   on  .  Patient to be transferred to facility by       Patient family notified on   of transfer.  Name of family member notified:        PHYSICIAN       Additional Comment:    _______________________________________________ [name], Congetta Odriscoll D, LCSW 03/09/2017, 2:36 PM

## 2017-03-10 LAB — PROTIME-INR
INR: 2.68
PROTHROMBIN TIME: 28.3 s — AB (ref 11.4–15.2)

## 2017-03-10 MED ORDER — WARFARIN SODIUM 1 MG PO TABS
1.0000 mg | ORAL_TABLET | Freq: Once | ORAL | Status: AC
Start: 2017-03-10 — End: 2017-03-10
  Administered 2017-03-10: 1 mg via ORAL
  Filled 2017-03-10: qty 1

## 2017-03-10 MED ORDER — INFLUENZA VAC SPLIT HIGH-DOSE 0.5 ML IM SUSY
0.5000 mL | PREFILLED_SYRINGE | INTRAMUSCULAR | Status: AC
  Administered 2017-03-11: 0.5 mL via INTRAMUSCULAR
  Filled 2017-03-10: qty 0.5

## 2017-03-10 NOTE — Care Management Important Message (Signed)
Important Message  Patient Details  Name: James [name] MRN: [id number] Date of Birth: [birth year]   Medicare Important Message Given:  Yes    Malcolm MetroChildress, Jasimine Simms Demske, RN 03/10/2017, 2:56 PM

## 2017-03-10 NOTE — Progress Notes (Signed)
Subjective: He is sleepy this morning. No other new complaints. He had debridement yesterday.  Objective: Vital signs in last 24 hours: Temp:  [97.7 F (36.5 C)-98.3 F (36.8 C)] 98 F (36.7 C) (09/13 0645) Pulse Rate:  [66-76] 71 (09/13 0645) Resp:  [16-18] 16 (09/13 0645) BP: (136-182)/(57-90) 182/83 (09/13 0645) SpO2:  [95 %-98 %] 98 % (09/13 0645) Weight change:  Last BM Date: 03/09/17  Intake/Output from previous day: 09/12 0701 - 09/13 0700 In: 470 [P.O.:120; IV Piggyback:350] Out: 2200 [Urine:2200]  PHYSICAL EXAM General appearance: alert, cooperative and no distress Resp: clear to auscultation bilaterally Cardio: irregularly irregular rhythm GI: soft, non-tender; bowel sounds normal; no masses,  no organomegaly Extremities: wound looks better skin turgor ok  Lab Results:  Results for orders placed or performed during the hospital encounter of 03/07/17 (from the past 48 hour(s))  Basic metabolic panel     Status: Abnormal   Collection Time: 03/09/17  4:13 AM  Result Value Ref Range   Sodium 135 135 - 145 mmol/L   Potassium 3.3 (L) 3.5 - 5.1 mmol/L   Chloride 97 (L) 101 - 111 mmol/L   CO2 31 22 - 32 mmol/L   Glucose, Bld 87 65 - 99 mg/dL   BUN 16 6 - 20 mg/dL   Creatinine, Ser 1.02 0.61 - 1.24 mg/dL   Calcium 8.8 (L) 8.9 - 10.3 mg/dL   GFR calc non Af Amer >60 >60 mL/min   GFR calc Af Amer >60 >60 mL/min    Comment: (NOTE) The eGFR has been calculated using the CKD EPI equation. This calculation has not been validated in all clinical situations. eGFR's persistently <60 mL/min signify possible Chronic Kidney Disease.    Anion gap 7 5 - 15  Protime-INR     Status: Abnormal   Collection Time: 03/09/17  4:13 AM  Result Value Ref Range   Prothrombin Time 29.3 (H) 11.4 - 15.2 seconds   INR 2.80   Protime-INR     Status: Abnormal   Collection Time: 03/10/17  4:57 AM  Result Value Ref Range   Prothrombin Time 28.3 (H) 11.4 - 15.2 seconds   INR 2.68      ABGS No results for input(s): PHART, PO2ART, TCO2, HCO3 in the last 72 hours.  Invalid input(s): PCO2 CULTURES Recent Results (from the past 240 hour(s))  Culture, blood (routine x 2)     Status: None (Preliminary result)   Collection Time: 03/07/17  4:26 PM  Result Value Ref Range Status   Specimen Description BLOOD LEFT HAND  Final   Special Requests   Final    BOTTLES DRAWN AEROBIC AND ANAEROBIC Blood Culture results may not be optimal due to an inadequate volume of blood received in culture bottles   Culture NO GROWTH 2 DAYS  Final   Report Status PENDING  Incomplete  Culture, blood (routine x 2)     Status: None (Preliminary result)   Collection Time: 03/07/17  4:26 PM  Result Value Ref Range Status   Specimen Description RIGHT ANTECUBITAL  Final   Special Requests   Final    BOTTLES DRAWN AEROBIC AND ANAEROBIC Blood Culture results may not be optimal due to an inadequate volume of blood received in culture bottles   Culture NO GROWTH 2 DAYS  Final   Report Status PENDING  Incomplete  Aerobic Culture (superficial specimen)     Status: None (Preliminary result)   Collection Time: 03/07/17  4:50 PM  Result Value Ref Range  Status   Specimen Description ABSCESS  Final   Special Requests NONE  Final   Gram Stain   Final    NO WBC SEEN FEW GRAM NEGATIVE RODS FEW GRAM POSITIVE COCCI    Culture   Final    CULTURE REINCUBATED FOR BETTER GROWTH Performed at [facility], [place] [address], [place], [place] [address]    Report Status PENDING  Incomplete   Studies/Results: Mr Foot Left W Wo Contrast  Result Date: 03/08/2017 CLINICAL DATA:  Left heel wound.  Evaluate for osteomyelitis. EXAM: MRI OF THE LEFT ANKLE WITHOUT AND WITH CONTRAST TECHNIQUE: Multiplanar, multisequence MR imaging of the left ankle was performed both before and after administration of intravenous contrast. CONTRAST:  29m MULTIHANCE GADOBENATE DIMEGLUMINE 529 MG/ML IV SOLN COMPARISON:  None. FINDINGS:  TENDONS Peroneal: Intact and normally positioned. Posteromedial: Intact and normally positioned. Anterior: Intact and normally positioned. Achilles: Intact.  Mild tendinosis. Plantar Fascia: Intact. LIGAMENTS Lateral: The anterior and posterior talofibular and calcaneofibular ligaments are intact. Medial: The deltoid and visualized portions of the spring ligament appear intact. CARTILAGE AND BONES Ankle Joint: Trace ankle joint effusion. The talar dome and tibial plafond are intact. Subtalar Joints/Sinus Tarsi: Trace posterior subtalar joint effusion. Normal signal within the sinus tarsi. Bones: Deep to the soft tissue ulceration, there is abnormal increased T2 marrow signal with corresponding T1 marrow hypointensity and cortical destruction of the posterior calcaneus, consistent with osteomyelitis. Bone marrow signal is otherwise unremarkable. Other: Soft tissue ulceration over the posterior heel. Diffuse soft tissue edema about the ankle and hindfoot. No drainable fluid collection. IMPRESSION: 1. Soft tissue ulceration over the posterior heel with underlying osteomyelitis of the adjacent posterior calcaneus. No drainable fluid collection. Electronically Signed   By: WTitus DubinM.D.   On: 03/08/2017 13:51   UKoreaArterial Abi (screening Lower Extremity)  Result Date: 03/08/2017 CLINICAL DATA:  Nonhealing left heel ulceration with visible osteomyelitis by MRI EXAM: NONINVASIVE PHYSIOLOGIC VASCULAR STUDY OF BILATERAL LOWER EXTREMITIES TECHNIQUE: Evaluation of both lower extremities were performed at rest, including calculation of ankle-brachial indices with single level Doppler, pressure and pulse volume recording. COMPARISON:  03/08/2017 FINDINGS: Right ABI:  1.16 Left ABI:  0.72 Right Lower Extremity: Irregular monophasic flow. Dorsalis pedis vasculature is noncompressible. Right ABI is likely overestimated secondary to peripheral vascular disease. Left Lower Extremity: Irregular and monophasic flow. Left  dorsalis pedis vasculature is also noncompressible. Decreased ABI as above. IMPRESSION: Irregular monophasic flow at the ankle vasculature bilaterally suggesting moderate range peripheral arterial disease. Right ABI is likely falsely elevated. Consider further evaluation with vascular consultation and CTA extremity runoff Electronically Signed   By: MJerilynn Mages  Shick M.D.   On: 03/08/2017 14:48    Medications:  Prior to Admission:  Prescriptions Prior to Admission  Medication Sig Dispense Refill Last Dose  . allopurinol (ZYLOPRIM) 300 MG tablet Take 300 mg by mouth daily with supper.    03/07/2017 at Unknown time  . furosemide (LASIX) 40 MG tablet TAKE 1/2 TABLET BY MOUTH DAILY. 45 tablet 2 03/07/2017 at Unknown time  . HYDROcodone-acetaminophen (NORCO/VICODIN) 5-325 MG tablet Take 1-2 tablets by mouth every 4 (four) hours as needed for moderate pain. 30 tablet 0 unknown  . lisinopril (PRINIVIL,ZESTRIL) 40 MG tablet TAKE ONE TABLET BY MOUTH DAILY. (Patient taking differently: TAKE ONE TABLET BY MOUTH at bedtime) 30 tablet 11 03/07/2017 at Unknown time  . metoprolol (LOPRESSOR) 50 MG tablet TAKE (1) TABLET BY MOUTH TWICE DAILY. 60 tablet 11 03/07/2017 at 2200  .  Multiple Vitamin (MULTIVITAMIN) tablet Take 1 tablet by mouth daily.     03/07/2017 at Unknown time  . Multiple Vitamins-Minerals (PRESERVISION AREDS PO) Take 1 tablet by mouth 2 (two) times daily.    03/07/2017 at Unknown time  . pantoprazole (PROTONIX) 40 MG tablet Take 40 mg by mouth daily.   unknown  . polyethylene glycol powder (GLYCOLAX/MIRALAX) powder Take 17 g by mouth at bedtime.  0 unknown  . potassium chloride (K-DUR) 10 MEQ tablet TAKE ONE TABLET BY MOUTH DAILY. 30 tablet 11 03/07/2017 at Unknown time  . simvastatin (ZOCOR) 40 MG tablet TAKE (1) TABLET BY MOUTH AT BEDTIME FOR CHOLESTEROL. 30 tablet 3 03/07/2017 at Unknown time  . tamsulosin (FLOMAX) 0.4 MG CAPS capsule Take 0.4 mg by mouth daily.  11 03/07/2017 at Unknown time  . warfarin  (COUMADIN) 2.5 MG tablet Take 2.5 mg by mouth daily. Except on Mondays   03/07/2017 at Unknown time   Scheduled: . allopurinol  300 mg Oral Q supper  . collagenase   Topical Daily  . furosemide  20 mg Oral Daily  . lisinopril  40 mg Oral Daily  . metoprolol tartrate  50 mg Oral BID  . multivitamin-lutein  1 capsule Oral BID  . pantoprazole  40 mg Oral Daily  . polyethylene glycol  17 g Oral QHS  . potassium chloride  20 mEq Oral Daily  . simvastatin  20 mg Oral q1800  . sodium chloride flush  3 mL Intravenous Q12H  . tamsulosin  0.4 mg Oral Daily  . Warfarin - Pharmacist Dosing Inpatient   Does not apply q1800   Continuous: . piperacillin-tazobactam (ZOSYN)  IV 3.375 g (03/10/17 0617)   TOI:[id number] **OR** acetaminophen, HYDROcodone-acetaminophen, ondansetron **OR** ondansetron (ZOFRAN) IV  Assesment: He has osteomyelitis of his heel. He had debridement of the ulcer yesterday and it looks better. He has chronic atrial fib which is stable. He is chronically anticoagulated which is stable. He is on IV antibiotics. He will require a prolonged course of IV antibiotics. Just disease consultation per phone and their recommendations are being instituted Active Problems:   Atrial fibrillation, chronic (HCC)   Hypertension   CKD (chronic kidney disease), stage III   Cerebrovascular disease   Chronic anticoagulation   Pressure injury of skin   Cellulitis and abscess of left leg   Skin ulcer of left heel with fat layer exposed ([place])    Plan: Reculture today. Continue current IV antibiotics probably to nursing home tomorrow    LOS: 3 days   Safwan Tomei L 03/10/2017, 8:44 AM

## 2017-03-10 NOTE — Progress Notes (Signed)
ANTICOAGULATION CONSULT NOTE   Pharmacy Consult for Coumadin Indication: atrial fibrillation  Allergies  Allergen Reactions  . No Known Allergies    Patient Measurements: Height: 5\' 4"  (162.6 cm) Weight: 158 lb 11.7 oz (72 kg) IBW/kg (Calculated) : 59.2  Vital Signs: Temp: 98 F (36.7 C) (09/13 0645) Temp Source: Oral (09/12 2107) BP: 182/83 (09/13 0645) Pulse Rate: 71 (09/13 0645)  Labs:  Recent Labs  03/07/17 1626 03/08/17 0503 03/09/17 0413 03/10/17 0457  HGB 12.2*  --   --   --   HCT 37.6*  --   --   --   PLT 165  --   --   --   LABPROT 26.3*  --  29.3* 28.3*  INR 2.44  --  2.80 2.68  CREATININE 0.92 1.02 1.02  --    Estimated Creatinine Clearance: 41.2 mL/min (by C-G formula based on SCr of 1.02 mg/dL).  Medical History: Past Medical History:  Diagnosis Date  . Anemia    H&H of 12.4/38.5 with MCV of 110 in /2012  . Arthritis   . Atrial fibrillation, chronic (HCC)    Permanent atrial fibrillation  . Cerebrovascular disease    Left posterior parietal infarction  . Chronic anticoagulation    H&H of 12.4/38.5 in 05/2011; negative stool Hemoccults in 05/2009  . Deep vein thrombosis (DVT) (HCC)   . Dysrhythmia   . Fasting hyperglycemia    Glucose values of 70-135 in 2010-2012  . GERD (gastroesophageal reflux disease)   . Herpes zoster 2011  . History of kidney stones   . Hyperlipidemia    Lipid profile in 05/2011:138, 120, 34, 80  . Hypertension    Lab 05/2011: Normal CMet except glucose of 121 and creatinine of 1.37  . Pulmonary embolism (HCC)   . Stroke (HCC)   . TOTAL KNEE REVISIONLeft    Medications:  Prescriptions Prior to Admission  Medication Sig Dispense Refill Last Dose  . allopurinol (ZYLOPRIM) 300 MG tablet Take 300 mg by mouth daily with supper.    03/07/2017 at Unknown time  . furosemide (LASIX) 40 MG tablet TAKE 1/2 TABLET BY MOUTH DAILY. 45 tablet 2 03/07/2017 at Unknown time  . HYDROcodone-acetaminophen (NORCO/VICODIN) 5-325 MG tablet  Take 1-2 tablets by mouth every 4 (four) hours as needed for moderate pain. 30 tablet 0 unknown  . lisinopril (PRINIVIL,ZESTRIL) 40 MG tablet TAKE ONE TABLET BY MOUTH DAILY. (Patient taking differently: TAKE ONE TABLET BY MOUTH at bedtime) 30 tablet 11 03/07/2017 at Unknown time  . metoprolol (LOPRESSOR) 50 MG tablet TAKE (1) TABLET BY MOUTH TWICE DAILY. 60 tablet 11 03/07/2017 at 2200  . Multiple Vitamin (MULTIVITAMIN) tablet Take 1 tablet by mouth daily.     03/07/2017 at Unknown time  . Multiple Vitamins-Minerals (PRESERVISION AREDS PO) Take 1 tablet by mouth 2 (two) times daily.    03/07/2017 at Unknown time  . pantoprazole (PROTONIX) 40 MG tablet Take 40 mg by mouth daily.   unknown  . polyethylene glycol powder (GLYCOLAX/MIRALAX) powder Take 17 g by mouth at bedtime.  0 unknown  . potassium chloride (K-DUR) 10 MEQ tablet TAKE ONE TABLET BY MOUTH DAILY. 30 tablet 11 03/07/2017 at Unknown time  . simvastatin (ZOCOR) 40 MG tablet TAKE (1) TABLET BY MOUTH AT BEDTIME FOR CHOLESTEROL. 30 tablet 3 03/07/2017 at Unknown time  . tamsulosin (FLOMAX) 0.4 MG CAPS capsule Take 0.4 mg by mouth daily.  11 03/07/2017 at Unknown time  . warfarin (COUMADIN) 2.5 MG tablet Take 2.5 mg  by mouth daily. Except on Mondays   03/07/2017 at Unknown time   Assessment: 83 yo male admitted with cellulitis. He is seen in the anticoagulation clinic and on chronic coumadin for atrial fibrillation. Pharmacy asked to continue coumadin and dosing while inpatient. INR is therapeutic  Goal of Therapy:  INR 2-3 Monitor platelets by anticoagulation protocol: Yes   Plan:  Coumadin  today Daily PT-INR Monitor for S/S of bleeding  Talbert Cage PharmD Clinical Pharmacist  03/10/2017,8:53 AM

## 2017-03-10 NOTE — Clinical Social Work Note (Signed)
[facility]     Cristin Penaflor, Juleen ChinaHeather D, LCSW

## 2017-03-10 NOTE — Progress Notes (Deleted)
.  h 

## 2017-03-10 NOTE — Progress Notes (Signed)
PT Cancellation Note  Patient Details Name: James [name] MRN: [id number] DOB: [birth year]   Cancelled Treatment:    Reason Eval/Treat Not Completed: Other (comment);Fatigue/lethargy limiting ability to participate Attempted PT session.  Upon arrival pt and his wife are sleeping.  Attempted to waken with light sound (trying not to wake wife) and touch, unable to waken.    9502 Cherry StreetCasey Rayn Enderson, LPTA; CBIS [phone] Juel BurrowCockerham, Jassica Zazueta Jo 03/10/2017, 2:29 PM

## 2017-03-11 DIAGNOSIS — M86272 Subacute osteomyelitis, left ankle and foot: Secondary | ICD-10-CM | POA: Diagnosis present

## 2017-03-11 DIAGNOSIS — L89302 Pressure ulcer of unspecified buttock, stage 2: Secondary | ICD-10-CM | POA: Diagnosis present

## 2017-03-11 LAB — AEROBIC CULTURE W GRAM STAIN (SUPERFICIAL SPECIMEN): Gram Stain: NONE SEEN

## 2017-03-11 LAB — BASIC METABOLIC PANEL
Anion gap: 10 (ref 5–15)
BUN: 15 mg/dL (ref 6–20)
CHLORIDE: 96 mmol/L — AB (ref 101–111)
CO2: 27 mmol/L (ref 22–32)
Calcium: 8.7 mg/dL — ABNORMAL LOW (ref 8.9–10.3)
Creatinine, Ser: 1.02 mg/dL (ref 0.61–1.24)
GFR calc non Af Amer: >60 mL/min (ref >60)
Glucose, Bld: 107 mg/dL — ABNORMAL HIGH (ref 65–99)
POTASSIUM: 3.8 mmol/L (ref 3.5–5.1)
SODIUM: 133 mmol/L — AB (ref 135–145)

## 2017-03-11 LAB — AEROBIC CULTURE  (SUPERFICIAL SPECIMEN)

## 2017-03-11 LAB — PROTIME-INR
INR: 3.09
PROTHROMBIN TIME: 31.6 s — AB (ref 11.4–15.2)

## 2017-03-11 MED ORDER — FUROSEMIDE 20 MG PO TABS
20.0000 mg | ORAL_TABLET | Freq: Every day | ORAL | Status: DC
  Filled 2017-03-11 (×3): qty 1

## 2017-03-11 MED ORDER — SIMVASTATIN 20 MG PO TABS
40.0000 mg | ORAL_TABLET | Freq: Every day | ORAL | Status: DC
  Filled 2017-03-11 (×3): qty 2

## 2017-03-11 MED ORDER — ADULT MULTIVITAMIN W/MINERALS CH: 1.0000 | ORAL_TABLET | Freq: Every day | ORAL | Status: DC

## 2017-03-11 MED ORDER — TAMSULOSIN HCL 0.4 MG PO CAPS
0.4000 mg | ORAL_CAPSULE | Freq: Every day | ORAL | Status: DC
  Filled 2017-03-11 (×3): qty 1

## 2017-03-11 MED ORDER — LISINOPRIL 10 MG PO TABS
40.0000 mg | ORAL_TABLET | Freq: Every day | ORAL | Status: DC
  Filled 2017-03-11 (×3): qty 4

## 2017-03-11 MED ORDER — POLYETHYLENE GLYCOL 3350 17 G PO PACK
17.0000 g | PACK | Freq: Every day | ORAL | Status: DC
  Filled 2017-03-11 (×3): qty 1

## 2017-03-11 MED ORDER — CEFAZOLIN SODIUM-DEXTROSE 2-4 GM/100ML-% IV SOLN
2.0000 g | Freq: Three times a day (TID) | INTRAVENOUS | Status: DC
  Filled 2017-03-11 (×3): qty 100

## 2017-03-11 MED ORDER — ALLOPURINOL 300 MG PO TABS
300.0000 mg | ORAL_TABLET | Freq: Every day | ORAL | Status: DC
  Filled 2017-03-11 (×3): qty 1

## 2017-03-11 MED ORDER — CEFAZOLIN SODIUM-DEXTROSE 2-4 GM/100ML-% IV SOLN: 2.0000 g | Freq: Three times a day (TID) | INTRAVENOUS | Status: AC

## 2017-03-11 MED ORDER — METOPROLOL TARTRATE 50 MG PO TABS: 50.0000 mg | ORAL_TABLET | Freq: Two times a day (BID) | ORAL | Status: DC

## 2017-03-11 MED ORDER — HYDROCODONE-ACETAMINOPHEN 5-325 MG PO TABS
1.0000 | ORAL_TABLET | ORAL | Status: DC | PRN
  Filled 2017-03-11 (×6): qty 1

## 2017-03-11 MED ORDER — HEPARIN SOD (PORK) LOCK FLUSH 100 UNIT/ML IV SOLN
250.0000 [IU] | INTRAVENOUS | Status: AC | PRN
  Administered 2017-03-11: 250 [IU]
  Filled 2017-03-11: qty 5

## 2017-03-11 MED ORDER — PANTOPRAZOLE SODIUM 40 MG PO TBEC
40.0000 mg | DELAYED_RELEASE_TABLET | Freq: Every day | ORAL | Status: DC
  Filled 2017-03-11 (×3): qty 1

## 2017-03-11 MED ORDER — WARFARIN SODIUM 2.5 MG PO TABS
2.5000 mg | ORAL_TABLET | ORAL | Status: DC
  Filled 2017-03-11 (×3): qty 1

## 2017-03-11 MED ORDER — CEFAZOLIN SODIUM-DEXTROSE 2-4 GM/100ML-% IV SOLN
2.0000 g | Freq: Three times a day (TID) | INTRAVENOUS | Status: DC
  Administered 2017-03-11: 2 g via INTRAVENOUS
  Filled 2017-03-11 (×10): qty 100

## 2017-03-11 MED ORDER — OCUVITE-LUTEIN PO CAPS
1.0000 | ORAL_CAPSULE | Freq: Two times a day (BID) | ORAL | Status: DC
  Filled 2017-03-11 (×6): qty 1

## 2017-03-11 MED ORDER — POTASSIUM CHLORIDE CRYS ER 10 MEQ PO TBCR
10.0000 meq | EXTENDED_RELEASE_TABLET | Freq: Every day | ORAL | Status: DC
  Filled 2017-03-11 (×3): qty 1

## 2017-03-11 MED ORDER — WARFARIN - PHARMACIST DOSING INPATIENT: Status: DC

## 2017-03-11 MED FILL — Cefazolin Sodium for IV Soln 2 GM and Dextrose 3% (50 ML): INTRAVENOUS | Qty: 150 | Status: AC

## 2017-03-11 NOTE — Progress Notes (Signed)
Report called to [facility] at the [facility].

## 2017-03-11 NOTE — Progress Notes (Signed)
ANTICOAGULATION CONSULT NOTE   Pharmacy Consult for Coumadin Indication: atrial fibrillation  Allergies  Allergen Reactions  . No Known Allergies    Patient Measurements: Height:  (162.6 cm) Weight: 158 lb 11.7 oz (72 kg) IBW/kg (Calculated) : 59.2  Vital Signs: Temp: 97.5 F (36.4 C) (09/14 0520) Temp Source: Oral (09/14 0520) BP: 173/83 (09/14 0520) Pulse Rate: 61 (09/14 0520)  Labs:  Recent Labs  03/09/17 0413 03/10/17 0457 03/11/17 0432  LABPROT 29.3* 28.3* 31.6*  INR 2.80 2.68 3.09  CREATININE 1.02  --  1.02   Estimated Creatinine Clearance: 41.2 mL/min (by C-G formula based on SCr of 1.02 mg/dL).  Medical History: Past Medical History:  Diagnosis Date  . Anemia    H&H of 12.4/38.5 with MCV of 110 in /2012  . Arthritis   . Atrial fibrillation, chronic (HCC)    Permanent atrial fibrillation  . Cerebrovascular disease    Left posterior parietal infarction  . Chronic anticoagulation    H&H of 12.4/38.5 in 05/2011; negative stool Hemoccults in 05/2009  . Deep vein thrombosis (DVT) (HCC)   . Dysrhythmia   . Fasting hyperglycemia    Glucose values of 70-135 in 2010-2012  . GERD (gastroesophageal reflux disease)   . Herpes zoster 2011  . History of kidney stones   . Hyperlipidemia    Lipid profile in 05/2011:138, 120, 34, 80  . Hypertension    Lab 05/2011: Normal CMet except glucose of 121 and creatinine of 1.37  . Pulmonary embolism (HCC)   . Stroke (HCC)   . TOTAL KNEE REVISIONLeft    Medications:  Prescriptions Prior to Admission  Medication Sig Dispense Refill Last Dose  . allopurinol (ZYLOPRIM) 300 MG tablet Take 300 mg by mouth daily with supper.    03/07/2017 at Unknown time  . furosemide (LASIX) 40 MG tablet TAKE 1/2 TABLET BY MOUTH DAILY. 45 tablet 2 03/07/2017 at Unknown time  . HYDROcodone-acetaminophen (NORCO/VICODIN) 5-325 MG tablet Take 1-2 tablets by mouth every 4 (four) hours as needed for moderate pain. 30 tablet 0 unknown  .  lisinopril (PRINIVIL,ZESTRIL) 40 MG tablet TAKE ONE TABLET BY MOUTH DAILY. (Patient taking differently: TAKE ONE TABLET BY MOUTH at bedtime) 30 tablet 11 03/07/2017 at Unknown time  . metoprolol (LOPRESSOR) 50 MG tablet TAKE (1) TABLET BY MOUTH TWICE DAILY. 60 tablet 11 03/07/2017 at 2200  . Multiple Vitamin (MULTIVITAMIN) tablet Take 1 tablet by mouth daily.     03/07/2017 at Unknown time  . Multiple Vitamins-Minerals (PRESERVISION AREDS PO) Take 1 tablet by mouth 2 (two) times daily.    03/07/2017 at Unknown time  . pantoprazole (PROTONIX) 40 MG tablet Take 40 mg by mouth daily.   unknown  . polyethylene glycol powder (GLYCOLAX/MIRALAX) powder Take 17 g by mouth at bedtime.  0 unknown  . potassium chloride (K-DUR) 10 MEQ tablet TAKE ONE TABLET BY MOUTH DAILY. 30 tablet 11 03/07/2017 at Unknown time  . simvastatin (ZOCOR) 40 MG tablet TAKE (1) TABLET BY MOUTH AT BEDTIME FOR CHOLESTEROL. 30 tablet 3 03/07/2017 at Unknown time  . tamsulosin (FLOMAX) 0.4 MG CAPS capsule Take 0.4 mg by mouth daily.  11 03/07/2017 at Unknown time  . warfarin (COUMADIN) 2.5 MG tablet Take 2.5 mg by mouth daily. Except on Mondays   03/07/2017 at Unknown time   Assessment: 83 yo male admitted with cellulitis. He is seen in the anticoagulation clinic and on chronic coumadin for atrial fibrillation. Pharmacy asked to continue coumadin and dosing while inpatient. INR  is supra therapeutic today.  Goal of Therapy:  INR 2-3 Monitor platelets by anticoagulation protocol: Yes   Plan:  No warfarin today. Daily PT-INR Monitor for S/S of bleeding  Mady Gemma, [facility]  03/11/2017,8:52 AM

## 2017-03-11 NOTE — Clinical Social Work Placement (Signed)
   CLINICAL SOCIAL WORK PLACEMENT  NOTE  Date:  03/11/2017  Patient Details  Name: James [name] MRN: [id number] Date of Birth: 09/04/1921  Clinical Social Work is seeking post-discharge placement for this patient at the Skilled  Nursing Facility level of care (*CSW will initial, date and re-position this form in  chart as items are completed):  Yes   Patient/family provided with [place] Clinical Social Work Department's list of facilities offering this level of care within the geographic area requested by the patient (or if unable, by the patient's family).  Yes   Patient/family informed of their freedom to choose among providers that offer the needed level of care, that participate in Medicare, Medicaid or managed care program needed by the patient, have an available bed and are willing to accept the patient.  Yes   Patient/family informed of [facility]'s ownership interest in [facility] and [facility], as well as of the fact that they are under no obligation to receive care at these facilities.  PASRR submitted to EDS on       PASRR number received on       Existing PASRR number confirmed on 03/09/17     FL2 transmitted to all facilities in geographic area requested by pt/family on       FL2 transmitted to all facilities within larger geographic area on       Patient informed that his/her managed care company has contracts with or will negotiate with certain facilities, including the following:            Patient/family informed of bed offers received.  Patient chooses bed at [facility]     Physician recommends and patient chooses bed at      Patient to be transferred to [facility] on 03/11/17.  Patient to be transferred to facility by [facility]     Patient family notified on 03/11/17 of transfer.  Name of family member notified:  Son, Willeen Cass;      PHYSICIAN       Additional Comment:     _______________________________________________ Annice Needy, LCSW 03/11/2017, 9:16 AM
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
## 2017-03-11 NOTE — Clinical Social Work Note (Signed)
Patient authorized by James [name]. Authorization number is [id number]. Facility aware and family aware of authorization and discharge today.    Ashlynne Shetterly, Juleen China, LCSW

## 2017-03-11 NOTE — Discharge Summary (Signed)
Physician Discharge Summary  Patient ID: James [name] MRN: [id number] DOB/AGE: 81-Aug-1923 81 y.o. Primary Care Physician:Desta Bujak, Ramon Dredge, MD Admit date: 03/07/2017 Discharge date: 03/11/2017    Discharge Diagnoses:   Active Problems:   Atrial fibrillation, chronic (HCC)   Hypertension   CKD (chronic kidney disease), stage III   Cerebrovascular disease   Chronic anticoagulation   Pressure injury of skin   Cellulitis and abscess of left leg   Skin ulcer of left heel with fat layer exposed (HCC)   Subacute osteomyelitis, left ankle and foot (HCC)   Decubitus ulcer of buttock, stage 2   Allergies as of 03/11/2017      Reactions   No Known Allergies       Medication List    TAKE these medications   allopurinol 300 MG tablet Commonly known as:  ZYLOPRIM Take 300 mg by mouth daily with supper.   ceFAZolin 2-4 GM/100ML-% IVPB Commonly known as:  ANCEF Inject 100 mLs (2 g total) into the vein every 8 (eight) hours.   furosemide 40 MG tablet Commonly known as:  LASIX TAKE 1/2 TABLET BY MOUTH DAILY.   HYDROcodone-acetaminophen 5-325 MG tablet Commonly known as:  NORCO/VICODIN Take 1-2 tablets by mouth every 4 (four) hours as needed for moderate pain.   lisinopril 40 MG tablet Commonly known as:  PRINIVIL,ZESTRIL TAKE ONE TABLET BY MOUTH DAILY. What changed:  See the new instructions.   metoprolol tartrate 50 MG tablet Commonly known as:  LOPRESSOR TAKE (1) TABLET BY MOUTH TWICE DAILY.   multivitamin tablet Take 1 tablet by mouth daily.   pantoprazole 40 MG tablet Commonly known as:  PROTONIX Take 40 mg by mouth daily.   polyethylene glycol powder powder Commonly known as:  GLYCOLAX/MIRALAX Take 17 g by mouth at bedtime.   potassium chloride 10 MEQ tablet Commonly known as:  K-DUR TAKE ONE TABLET BY MOUTH DAILY.   PRESERVISION AREDS PO Take 1 tablet by mouth 2 (two) times daily.   simvastatin 40 MG tablet Commonly known as:  ZOCOR TAKE (1) TABLET BY  MOUTH AT BEDTIME FOR CHOLESTEROL.   tamsulosin 0.4 MG Caps capsule Commonly known as:  FLOMAX Take 0.4 mg by mouth daily.   warfarin 2.5 MG tablet Commonly known as:  COUMADIN Take 2.5 mg by mouth daily. Except on Mondays            Discharge Care Instructions        Start     Ordered   03/11/17 0000  ceFAZolin (ANCEF) 2-4 GM/100ML-% IVPB  Every 8 hours     03/11/17 0903   03/11/17 0000  Increase activity slowly     03/11/17 0903   03/11/17 0000  Diet - low sodium heart healthy     03/11/17 7829      Discharged Condition:Improved    Consults: Gen. surgery/infectious disease by telephone  Significant Diagnostic Studies: Mr Foot Left W Wo Contrast  Result Date: 03/08/2017 CLINICAL DATA:  Left heel wound.  Evaluate for osteomyelitis. EXAM: MRI OF THE LEFT ANKLE WITHOUT AND WITH CONTRAST TECHNIQUE: Multiplanar, multisequence MR imaging of the left ankle was performed both before and after administration of intravenous contrast. CONTRAST:  15mL MULTIHANCE GADOBENATE DIMEGLUMINE 529 MG/ML IV SOLN COMPARISON:  None. FINDINGS: TENDONS Peroneal: Intact and normally positioned. Posteromedial: Intact and normally positioned. Anterior: Intact and normally positioned. Achilles: Intact.  Mild tendinosis. Plantar Fascia: Intact. LIGAMENTS Lateral: The anterior and posterior talofibular and calcaneofibular ligaments are intact. Medial: The deltoid and visualized  portions of the spring ligament appear intact. CARTILAGE AND BONES Ankle Joint: Trace ankle joint effusion. The talar dome and tibial plafond are intact. Subtalar Joints/Sinus Tarsi: Trace posterior subtalar joint effusion. Normal signal within the sinus tarsi. Bones: Deep to the soft tissue ulceration, there is abnormal increased T2 marrow signal with corresponding T1 marrow hypointensity and cortical destruction of the posterior calcaneus, consistent with osteomyelitis. Bone marrow signal is otherwise unremarkable. Other: Soft tissue  ulceration over the posterior heel. Diffuse soft tissue edema about the ankle and hindfoot. No drainable fluid collection. IMPRESSION: 1. Soft tissue ulceration over the posterior heel with underlying osteomyelitis of the adjacent posterior calcaneus. No drainable fluid collection. Electronically Signed   By: Obie Dredge M.D.   On: 03/08/2017 13:51   US Arterial Abi (screening Lower Extremity)  Result Date: 03/08/2017 CLINICAL DATA:  Nonhealing left heel ulceration with visible osteomyelitis by MRI EXAM: NONINVASIVE PHYSIOLOGIC VASCULAR STUDY OF BILATERAL LOWER EXTREMITIES TECHNIQUE: Evaluation of both lower extremities were performed at rest, including calculation of ankle-brachial indices with single level Doppler, pressure and pulse volume recording. COMPARISON:  03/08/2017 FINDINGS: Right ABI:  1.16 Left ABI:  0.72 Right Lower Extremity: Irregular monophasic flow. Dorsalis pedis vasculature is noncompressible. Right ABI is likely overestimated secondary to peripheral vascular disease. Left Lower Extremity: Irregular and monophasic flow. Left dorsalis pedis vasculature is also noncompressible. Decreased ABI as above. IMPRESSION: Irregular monophasic flow at the ankle vasculature bilaterally suggesting moderate range peripheral arterial disease. Right ABI is likely falsely elevated. Consider further evaluation with vascular consultation and CTA extremity runoff Electronically Signed   By: Judie Petit.  Shick M.D.   On: 03/08/2017 14:48    Lab Results: Basic Metabolic Panel:  Recent Labs  11/91/47 0413 03/11/17 0432  NA 135 133*  K 3.3* 3.8  CL 97* 96*  CO2 31 27  GLUCOSE 87 107*  BUN 16 15  CREATININE 1.02 1.02  CALCIUM 8.8* 8.7*   Liver Function Tests: No results for input(s): AST, ALT, ALKPHOS, BILITOT, PROT, ALBUMIN in the last 72 hours.   CBC: No results for input(s): WBC, NEUTROABS, HGB, HCT, MCV, PLT in the last 72 hours.  Recent Results (from the past 240 hour(s))  Culture, blood  (routine x 2)     Status: None (Preliminary result)   Collection Time: 03/07/17  4:26 PM  Result Value Ref Range Status   Specimen Description BLOOD LEFT HAND  Final   Special Requests   Final    BOTTLES DRAWN AEROBIC AND ANAEROBIC Blood Culture results may not be optimal due to an inadequate volume of blood received in culture bottles   Culture NO GROWTH 4 DAYS  Final   Report Status PENDING  Incomplete  Culture, blood (routine x 2)     Status: None (Preliminary result)   Collection Time: 03/07/17  4:26 PM  Result Value Ref Range Status   Specimen Description RIGHT ANTECUBITAL  Final   Special Requests   Final    BOTTLES DRAWN AEROBIC AND ANAEROBIC Blood Culture results may not be optimal due to an inadequate volume of blood received in culture bottles   Culture NO GROWTH 4 DAYS  Final   Report Status PENDING  Incomplete  Aerobic Culture (superficial specimen)     Status: None (Preliminary result)   Collection Time: 03/07/17  4:50 PM  Result Value Ref Range Status   Specimen Description ABSCESS  Final   Special Requests NONE  Final   Gram Stain   Final  NO WBC SEEN FEW GRAM NEGATIVE RODS FEW GRAM POSITIVE COCCI    Culture   Final    CULTURE REINCUBATED FOR BETTER GROWTH Performed at [facility], [address], [place], [place] [address]    Report Status PENDING  Incomplete  Aerobic Culture (superficial specimen)     Status: None (Preliminary result)   Collection Time: 03/10/17  1:00 PM  Result Value Ref Range Status   Specimen Description HEEL  Final   Special Requests NONE  Final   Gram Stain   Final    MODERATE WBC PRESENT, PREDOMINANTLY PMN NO SQUAMOUS EPITHELIAL CELLS SEEN FEW GRAM POSITIVE COCCI IN PAIRS Performed at [facility], [address], [place], [place] [address]    Culture PENDING  Incomplete   Report Status PENDING  Incomplete     Hospital Course: This is a 83 year old who's had trouble with ambulation since he fell and broke his  pelvis about 2 months ago. He has been in a skilled care facility and eventually was transferred home but has developed a decubitus on his left heel. This is been being treated locally but his family brought him in because a decubitus looked different and when I checked him in my office there was a soup venous to this area and it had a foul smell so I admitted him to the hospital for IV antibiotics. Workup included MRI of the foot which showed he had osteomyelitis which is felt to be probably subacute. He was started on IV antibiotics. Culture was done but is still pending. Telephone consultation with infectious disease was obtained and he is going to be on Ancef 2 g IV every 8 hours for 6 weeks for this. He also had a vascular study of his leg that showed he had poor circulation into his leg which is not unexpected. He had debridement of the area by general surgery and his ulcerated area looks better at the time of discharge.  Discharge Exam: Blood pressure (!) 173/83, pulse 61, temperature (!) 97.5 F (36.4 C), temperature source Oral, resp. rate 15, height  (1.626 m), weight 72 kg (158 lb 11.7 oz), SpO2 100 %. He's awake and alert. He is in atrial fib. His chest is clear. He has a stage II decubitus on his buttocks. He has the ulcerated area on his left heel.  Disposition: To skilled care facility for for further IV antibiotics. He has a PICC line in place. He will need facility protocol PICC line care. He will need to have PT/INR done on Monday, 03/14/2017 and this should be called to the [facility] Coumadin clinic in [place]. He will have PT OT and speech as needed. He will be on heart healthy diet.  Discharge Instructions    Diet - low sodium heart healthy    Complete by:  As directed    Increase activity slowly    Complete by:  As directed         Signed: Ogle Hoeffner L   03/11/2017, 9:03 AM

## 2017-03-11 NOTE — Clinical Social Work Note (Signed)
Chris at [facility] advised that [facility] would have to send patient's prescriptions with him for three days due to facility pharmacy being in [place] and not delivering due to the weather as well as being out of electricity and unable to send to another pharmacy.  Thayer Ohm advised that [facility] could bill the facility for the medications.     Barbi Kumagai, Juleen China, LCSW

## 2017-03-11 NOTE — Progress Notes (Signed)
Subjective: He has no new complaints. He's been afebrile. We are working on placement issues.  Objective: Vital signs in last 24 hours: Temp:  [97.5 F (36.4 C)-98 F (36.7 C)] 97.5 F (36.4 C) (09/14 0520) Pulse Rate:  [61-66] 61 (09/14 0520) Resp:  [15-20] 15 (09/14 0520) BP: (151-173)/(83-91) 173/83 (09/14 0520) SpO2:  [97 %-100 %] 100 % (09/14 0520) Weight change:  Last BM Date: 03/10/17  Intake/Output from previous day: 09/13 0701 - 09/14 0700 In: 770 [P.O.:720; IV Piggyback:50] Out: -   PHYSICAL EXAM General appearance: alert, cooperative and no distress Resp: clear to auscultation bilaterally Cardio: irregularly irregular rhythm GI: soft, non-tender; bowel sounds normal; no masses,  no organomegaly Extremities: heel ulcer looks cleaner. Very hard of hearing  Lab Results:  Results for orders placed or performed during the hospital encounter of 03/07/17 (from the past 48 hour(s))  Protime-INR     Status: Abnormal   Collection Time: 03/10/17  4:57 AM  Result Value Ref Range   Prothrombin Time 28.3 (H) 11.4 - 15.2 seconds   INR 2.68   Aerobic Culture (superficial specimen)     Status: None (Preliminary result)   Collection Time: 03/10/17  1:00 PM  Result Value Ref Range   Specimen Description HEEL    Special Requests NONE    Gram Stain      MODERATE WBC PRESENT, PREDOMINANTLY PMN NO SQUAMOUS EPITHELIAL CELLS SEEN FEW GRAM POSITIVE COCCI IN PAIRS Performed at [facility], [address], [place],  [address]    Culture PENDING    Report Status PENDING   Basic metabolic panel     Status: Abnormal   Collection Time: 03/11/17  4:32 AM  Result Value Ref Range   Sodium 133 (L) 135 - 145 mmol/L   Potassium 3.8 3.5 - 5.1 mmol/L   Chloride 96 (L) 101 - 111 mmol/L   CO2 27 22 - 32 mmol/L   Glucose, Bld 107 (H) 65 - 99 mg/dL   BUN 15 6 - 20 mg/dL   Creatinine, Ser 1.02 0.61 - 1.24 mg/dL   Calcium 8.7 (L) 8.9 - 10.3 mg/dL   GFR calc non Af Amer >60  >60 mL/min   GFR calc Af Amer >60 >60 mL/min    Comment: (NOTE) The eGFR has been calculated using the CKD EPI equation. This calculation has not been validated in all clinical situations. eGFR's persistently <60 mL/min signify possible Chronic Kidney Disease.    Anion gap 10 5 - 15  Protime-INR     Status: Abnormal   Collection Time: 03/11/17  4:32 AM  Result Value Ref Range   Prothrombin Time 31.6 (H) 11.4 - 15.2 seconds   INR 3.09     ABGS No results for input(s): PHART, PO2ART, TCO2, HCO3 in the last 72 hours.  Invalid input(s): PCO2 CULTURES Recent Results (from the past 240 hour(s))  Culture, blood (routine x 2)     Status: None (Preliminary result)   Collection Time: 03/07/17  4:26 PM  Result Value Ref Range Status   Specimen Description BLOOD LEFT HAND  Final   Special Requests   Final    BOTTLES DRAWN AEROBIC AND ANAEROBIC Blood Culture results may not be optimal due to an inadequate volume of blood received in culture bottles   Culture NO GROWTH 4 DAYS  Final   Report Status PENDING  Incomplete  Culture, blood (routine x 2)     Status: None (Preliminary result)   Collection Time: 03/07/17  4:26 PM  Result Value Ref Range Status   Specimen Description RIGHT ANTECUBITAL  Final   Special Requests   Final    BOTTLES DRAWN AEROBIC AND ANAEROBIC Blood Culture results may not be optimal due to an inadequate volume of blood received in culture bottles   Culture NO GROWTH 4 DAYS  Final   Report Status PENDING  Incomplete  Aerobic Culture (superficial specimen)     Status: None (Preliminary result)   Collection Time: 03/07/17  4:50 PM  Result Value Ref Range Status   Specimen Description ABSCESS  Final   Special Requests NONE  Final   Gram Stain   Final    NO WBC SEEN FEW GRAM NEGATIVE RODS FEW GRAM POSITIVE COCCI    Culture   Final    CULTURE REINCUBATED FOR BETTER GROWTH Performed at [facility], [place] [address], [place], [place] [address]    Report Status  PENDING  Incomplete  Aerobic Culture (superficial specimen)     Status: None (Preliminary result)   Collection Time: 03/10/17  1:00 PM  Result Value Ref Range Status   Specimen Description HEEL  Final   Special Requests NONE  Final   Gram Stain   Final    MODERATE WBC PRESENT, PREDOMINANTLY PMN NO SQUAMOUS EPITHELIAL CELLS SEEN FEW GRAM POSITIVE COCCI IN PAIRS Performed at [facility], [place] [address], [place], [place] [address]    Culture PENDING  Incomplete   Report Status PENDING  Incomplete   Studies/Results: No results found.  Medications:  Prior to Admission:  Prescriptions Prior to Admission  Medication Sig Dispense Refill Last Dose  . allopurinol (ZYLOPRIM) 300 MG tablet Take 300 mg by mouth daily with supper.    03/07/2017 at Unknown time  . furosemide (LASIX) 40 MG tablet TAKE 1/2 TABLET BY MOUTH DAILY. 45 tablet 2 03/07/2017 at Unknown time  . HYDROcodone-acetaminophen (NORCO/VICODIN) 5-325 MG tablet Take 1-2 tablets by mouth every 4 (four) hours as needed for moderate pain. 30 tablet 0 unknown  . lisinopril (PRINIVIL,ZESTRIL) 40 MG tablet TAKE ONE TABLET BY MOUTH DAILY. (Patient taking differently: TAKE ONE TABLET BY MOUTH at bedtime) 30 tablet 11 03/07/2017 at Unknown time  . metoprolol (LOPRESSOR) 50 MG tablet TAKE (1) TABLET BY MOUTH TWICE DAILY. 60 tablet 11 03/07/2017 at 2200  . Multiple Vitamin (MULTIVITAMIN) tablet Take 1 tablet by mouth daily.     03/07/2017 at Unknown time  . Multiple Vitamins-Minerals (PRESERVISION AREDS PO) Take 1 tablet by mouth 2 (two) times daily.    03/07/2017 at Unknown time  . pantoprazole (PROTONIX) 40 MG tablet Take 40 mg by mouth daily.   unknown  . polyethylene glycol powder (GLYCOLAX/MIRALAX) powder Take 17 g by mouth at bedtime.  0 unknown  . potassium chloride (K-DUR) 10 MEQ tablet TAKE ONE TABLET BY MOUTH DAILY. 30 tablet 11 03/07/2017 at Unknown time  . simvastatin (ZOCOR) 40 MG tablet TAKE (1) TABLET BY MOUTH AT BEDTIME FOR  CHOLESTEROL. 30 tablet 3 03/07/2017 at Unknown time  . tamsulosin (FLOMAX) 0.4 MG CAPS capsule Take 0.4 mg by mouth daily.  11 03/07/2017 at Unknown time  . warfarin (COUMADIN) 2.5 MG tablet Take 2.5 mg by mouth daily. Except on Mondays   03/07/2017 at Unknown time   Scheduled: . allopurinol  300 mg Oral Q supper  . collagenase   Topical Daily  . furosemide  20 mg Oral Daily  . Influenza vac split quadrivalent PF  0.5 mL Intramuscular Tomorrow-1000  .  lisinopril  40 mg Oral Daily  . metoprolol tartrate  50 mg Oral BID  . multivitamin-lutein  1 capsule Oral BID  . pantoprazole  40 mg Oral Daily  . polyethylene glycol  17 g Oral QHS  . potassium chloride  20 mEq Oral Daily  . simvastatin  20 mg Oral q1800  . sodium chloride flush  3 mL Intravenous Q12H  . tamsulosin  0.4 mg Oral Daily  . Warfarin - Pharmacist Dosing Inpatient   Does not apply q1800   Continuous: . piperacillin-tazobactam (ZOSYN)  IV 3.375 g (03/11/17 9471)   [id number] **OR** acetaminophen, HYDROcodone-acetaminophen, ondansetron **OR** ondansetron (ZOFRAN) IV  Assesment: He has subacute osteomyelitis of his left heel. Cultures thus far are not helpful.  He has chronic atrial fib and he is chronically anticoagulated  He has BPH which is stable  He has hypertension which is well controlled  He has had a lot of difficulty ambulating after a pelvic fracture and that's where he got the heel ulcer. He also has a decubitus on his buttocks Active Problems:   Atrial fibrillation, chronic (HCC)   Hypertension   CKD (chronic kidney disease), stage III   Cerebrovascular disease   Chronic anticoagulation   Pressure injury of skin   Cellulitis and abscess of left leg   Skin ulcer of left heel with fat layer exposed ([place])   Subacute osteomyelitis, left ankle and foot (HCC)   Decubitus ulcer of buttock, stage 2    Plan: Discussed with Dr. Johnnye Sima from infectious disease. He recommends Ancef 2 g IV every 8 hours for  6 weeks. This may be easier on Mr. Schnick GI tract and then Zosyn. He may be able to be transferred to nursing home today    LOS: 4 days   Cohen Doleman L 03/11/2017, 8:25 AM

## 2017-03-11 NOTE — Progress Notes (Signed)
James [name] discharged to [facility], Skilled nursing facility per MD order.   Patient transported via [facility],  no distress noted upon discharge.  James [name] 03/11/2017 1:42 PM

## 2017-03-12 LAB — CULTURE, BLOOD (ROUTINE X 2)
Culture: NO GROWTH
Culture: NO GROWTH

## 2017-03-13 LAB — AEROBIC CULTURE  (SUPERFICIAL SPECIMEN)

## 2017-03-13 LAB — AEROBIC CULTURE W GRAM STAIN (SUPERFICIAL SPECIMEN)

## 2017-03-14 ENCOUNTER — Ambulatory Visit (INDEPENDENT_AMBULATORY_CARE_PROVIDER_SITE_OTHER): Payer: Medicare Other | Admitting: *Deleted

## 2017-03-14 ENCOUNTER — Telehealth: Payer: Self-pay | Admitting: *Deleted

## 2017-03-14 DIAGNOSIS — I482 Chronic atrial fibrillation, unspecified: Secondary | ICD-10-CM

## 2017-03-14 DIAGNOSIS — Z5181 Encounter for therapeutic drug level monitoring: Secondary | ICD-10-CM | POA: Diagnosis not present

## 2017-03-14 LAB — POCT INR: INR: 2.3

## 2017-03-14 NOTE — Telephone Encounter (Signed)
Done.  See coumadin note. 

## 2017-03-14 NOTE — Telephone Encounter (Signed)
Jeral Pinch LPN @ [facility] [phone]  PT 23.5 INR 2.3

## 2017-05-06 ENCOUNTER — Encounter: Payer: Self-pay | Admitting: Cardiovascular Disease

## 2017-05-06 ENCOUNTER — Ambulatory Visit (INDEPENDENT_AMBULATORY_CARE_PROVIDER_SITE_OTHER): Payer: Medicare Other | Admitting: Cardiovascular Disease

## 2017-05-06 VITALS — BP 100/68 | HR 53 | Ht 70.0 in

## 2017-05-06 DIAGNOSIS — I340 Nonrheumatic mitral (valve) insufficiency: Secondary | ICD-10-CM

## 2017-05-06 DIAGNOSIS — I482 Chronic atrial fibrillation, unspecified: Secondary | ICD-10-CM

## 2017-05-06 DIAGNOSIS — Z7901 Long term (current) use of anticoagulants: Secondary | ICD-10-CM

## 2017-05-06 DIAGNOSIS — I10 Essential (primary) hypertension: Secondary | ICD-10-CM | POA: Diagnosis not present

## 2017-05-06 NOTE — Progress Notes (Signed)
SUBJECTIVE: The patient presents for annual follow-up.  He has chronic atrial fibrillation, hypertension, and prior CVA.  He is anticoagulated with warfarin.  Echocardiogram performed on 06/02/11 demonstrated normal left ventricular systolic function, LVEF 60-65%, with moderate biatrial enlargement and moderately elevated pulmonary pressures , 49 mmHg.  He is currently at [facility] being treated for osteomyelitis.  He had pneumonia earlier this year.  He currently denies chest pain, palpitations, bleeding problems, and shortness of breath.    Review of Systems: As per "subjective", otherwise negative.  Allergies  Allergen Reactions  . No Known Allergies     Current Outpatient Medications  Medication Sig Dispense Refill  . allopurinol (ZYLOPRIM) 300 MG tablet Take 300 mg by mouth daily with supper.     Marland Kitchen. ceFAZolin (ANCEF) 2-4 GM/100ML-% IVPB Inject 100 mLs (2 g total) into the vein every 8 (eight) hours. 1 each   . furosemide (LASIX) 20 MG tablet Take 20 mg daily by mouth.    . hydrALAZINE (APRESOLINE) 25 MG tablet Take 25 mg daily by mouth.    Marland Kitchen. HYDROcodone-acetaminophen (NORCO/VICODIN) 5-325 MG tablet Take 1-2 tablets by mouth every 4 (four) hours as needed for moderate pain. 30 tablet 0  . lisinopril (PRINIVIL,ZESTRIL) 40 MG tablet TAKE ONE TABLET BY MOUTH DAILY. (Patient taking differently: TAKE ONE TABLET BY MOUTH at bedtime) 30 tablet 11  . metoprolol (LOPRESSOR) 50 MG tablet TAKE (1) TABLET BY MOUTH TWICE DAILY. (Patient taking differently: 1.5 tablets bid) 60 tablet 11  . Multiple Vitamin (MULTIVITAMIN) tablet Take 1 tablet by mouth daily.      . Multiple Vitamins-Minerals (PRESERVISION AREDS PO) Take 1 tablet by mouth 2 (two) times daily.     . pantoprazole (PROTONIX) 40 MG tablet Take 40 mg by mouth daily.    . polyethylene glycol powder (GLYCOLAX/MIRALAX) powder Take 17 g by mouth at bedtime.  0  . potassium chloride (K-DUR) 10 MEQ tablet TAKE ONE TABLET BY  MOUTH DAILY. 30 tablet 11  . tamsulosin (FLOMAX) 0.4 MG CAPS capsule Take 0.4 mg by mouth daily.  11  . warfarin (COUMADIN) 2 MG tablet Take 2 mg daily by mouth.    . warfarin (COUMADIN) 2.5 MG tablet Take 2.5 mg daily by mouth.     No current facility-administered medications for this visit.     Past Medical History:  Diagnosis Date  . Anemia    H&H of 12.4/38.5 with MCV of 110 in /2012  . Arthritis   . Atrial fibrillation, chronic (HCC)    Permanent atrial fibrillation  . Cerebrovascular disease    Left posterior parietal infarction  . Chronic anticoagulation    H&H of 12.4/38.5 in 05/2011; negative stool Hemoccults in 05/2009  . Deep vein thrombosis (DVT) (HCC)   . Dysrhythmia   . Fasting hyperglycemia    Glucose values of 70-135 in 2010-2012  . GERD (gastroesophageal reflux disease)   . Herpes zoster 2011  . History of kidney stones   . Hyperlipidemia    Lipid profile in 05/2011:138, 120, 34, 80  . Hypertension    Lab 05/2011: Normal CMet except glucose of 121 and creatinine of 1.37  . Pulmonary embolism (HCC)   . Stroke (HCC)   . TOTAL KNEE REVISIONLeft     Past Surgical History:  Procedure Laterality Date  . APPENDECTOMY    . CHOLECYSTECTOMY    . COLONOSCOPY    . EYE SURGERY Bilateral   . HEMORRHOID SURGERY    .  HIP FRACTURE SURGERY    . JOINT REPLACEMENT    . RENAL ARTERY STENT    . WISDOM TOOTH EXTRACTION      Social History   Socioeconomic History  . Marital status: Married    Spouse name: Not on file  . Number of children: Not on file  . Years of education: Not on file  . Highest education level: Not on file  Social Needs  . Financial resource strain: Not on file  . Food insecurity - worry: Not on file  . Food insecurity - inability: Not on file  . Transportation needs - medical: Not on file  . Transportation needs - non-medical: Not on file  Occupational History  . Not on file  Tobacco Use  . Smoking status: Never Smoker  . Smokeless  tobacco: Never Used  Substance and Sexual Activity  . Alcohol use: No    Alcohol/week: 0.0 oz  . Drug use: No  . Sexual activity: No  Other Topics Concern  . Not on file  Social History Narrative  . Not on file     Vitals:   05/06/17 0953  BP: 100/68  Pulse: (!) 53  SpO2: 98%  Height: 5\' 10"  (1.778 m)    Wt Readings from Last 3 Encounters:  11/01/16 195 lb (88.5 kg)  07/30/16 185 lb (83.9 kg)  02/25/16 187 lb (84.8 kg)     PHYSICAL EXAM General: NAD HEENT: Normal. Neck: No JVD, no thyromegaly. Lungs: Clear to auscultation bilaterally with normal respiratory effort. CV: Regular rate and irregular rhythm, normal S1/S2, no S3/S4, 4/6 apical holosystolic murmur with radiation to the axilla.  Left leg bandaged. Abdomen: Soft, nontender, no distention.  Neurologic: Alert and oriented.  Psych: Normal affect. Skin: Normal.    ECG: Most recent ECG reviewed.   Labs: Lab Results  Component Value Date/Time   K 3.8 03/11/2017 04:32 AM   BUN 15 03/11/2017 04:32 AM   CREATININE 1.02 03/11/2017 04:32 AM   CREATININE 1.45 (H) 01/24/2012 11:45 AM   ALT 30 03/07/2017 04:26 PM   HGB 12.2 (L) 03/07/2017 04:26 PM     Lipids: Lab Results  Component Value Date/Time   LDLCALC 80 05/31/2011 01:25 PM   CHOL 138 05/31/2011 01:25 PM   TRIG 120 05/31/2011 01:25 PM   HDL 34 (L) 05/31/2011 01:25 PM       ASSESSMENT AND PLAN: 1.  Chronic atrial fibrillation: Symptomatically stable.  Heart rate is controlled on metoprolol.  Anticoagulated with warfarin.  No changes to therapy.  2.  Chronic hypertension: I am told blood pressure has been erratic but it is currently normal.  I am not inclined to adjust medications today.  3.  Mitral regurgitation: He is asymptomatic.  By auscultation, it sounds severe.  This will be treated medically.    Disposition: Follow up 1 year   Prentice DockerSuresh Koneswaran, M.D., F.A.C.C.

## 2017-05-06 NOTE — Patient Instructions (Signed)
Your physician wants you to follow-up in: 1 YEAR WITH DR KONESWARAN You will receive a reminder letter in the mail two months in advance. If you don't receive a letter, please call our office to schedule the follow-up appointment.  Your physician recommends that you continue on your current medications as directed. Please refer to the Current Medication list given to you today.  Thank you for choosing [facility]!!    

## 2017-08-31 ENCOUNTER — Ambulatory Visit: Payer: Self-pay | Admitting: *Deleted

## 2017-09-05 IMAGING — DX DG ELBOW COMPLETE 3+V*L*
4 series · 4 of 4 positions shown · non-contrast
Comparison: None.

CLINICAL DATA: Fall last night.  Scan laceration on elbow

EXAM:
LEFT ELBOW - COMPLETE 3+ VIEW

[elbow ap]
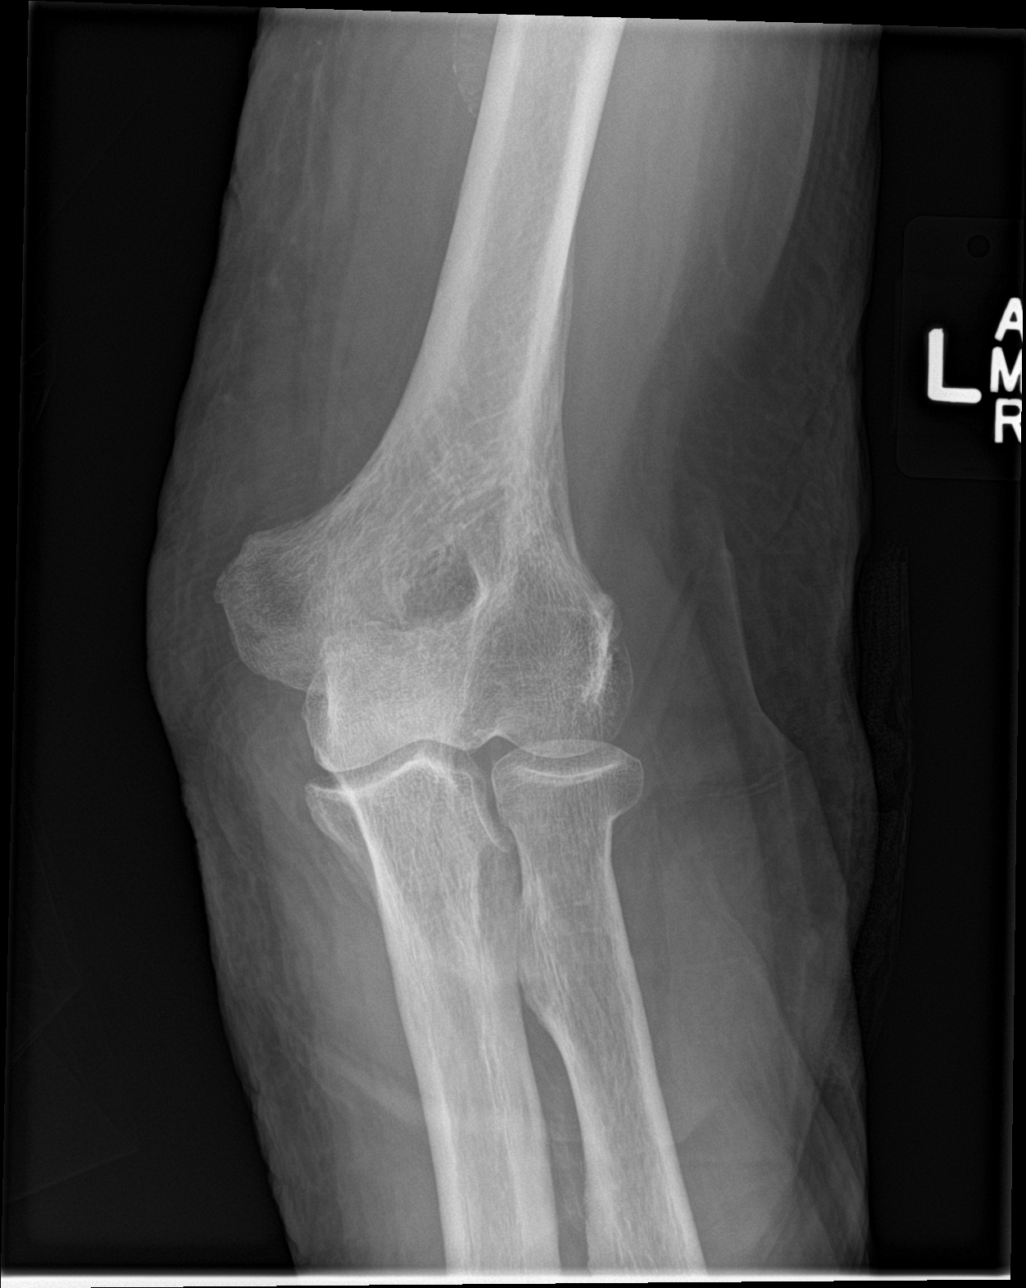

[elbow obl]
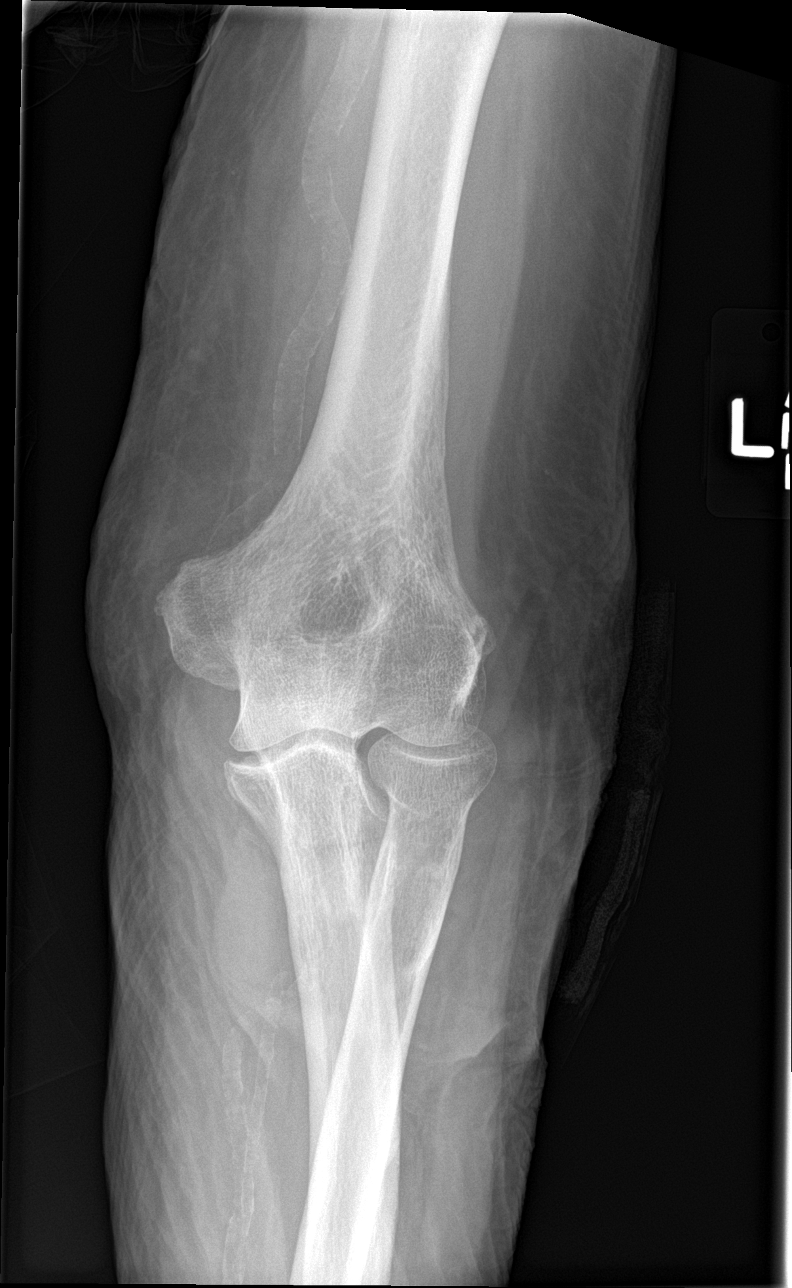

[elbow lat (1 of 2)]
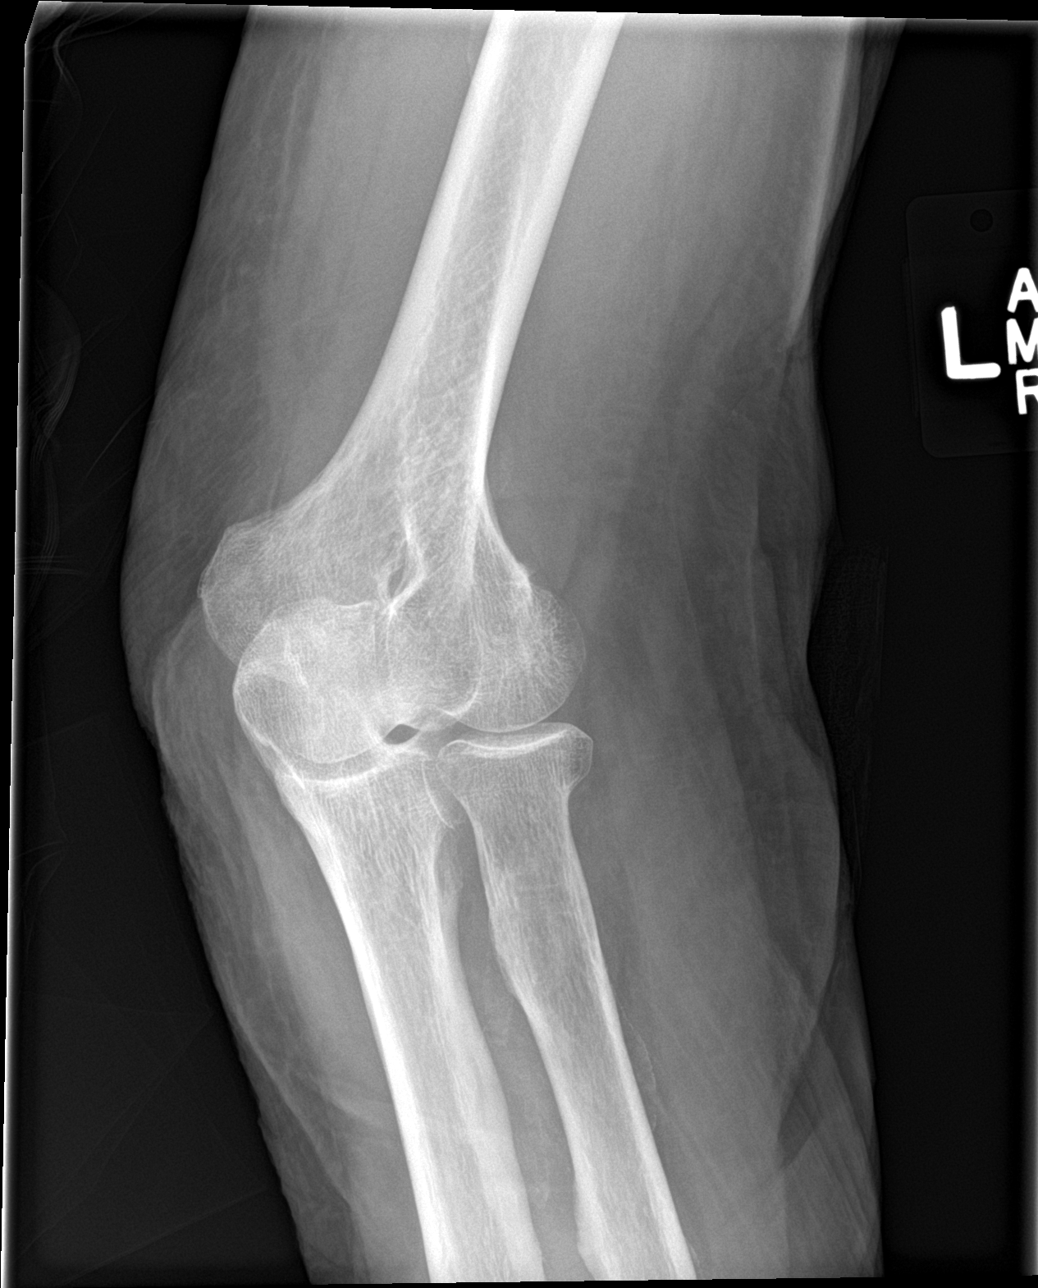

[elbow lat (2 of 2)]
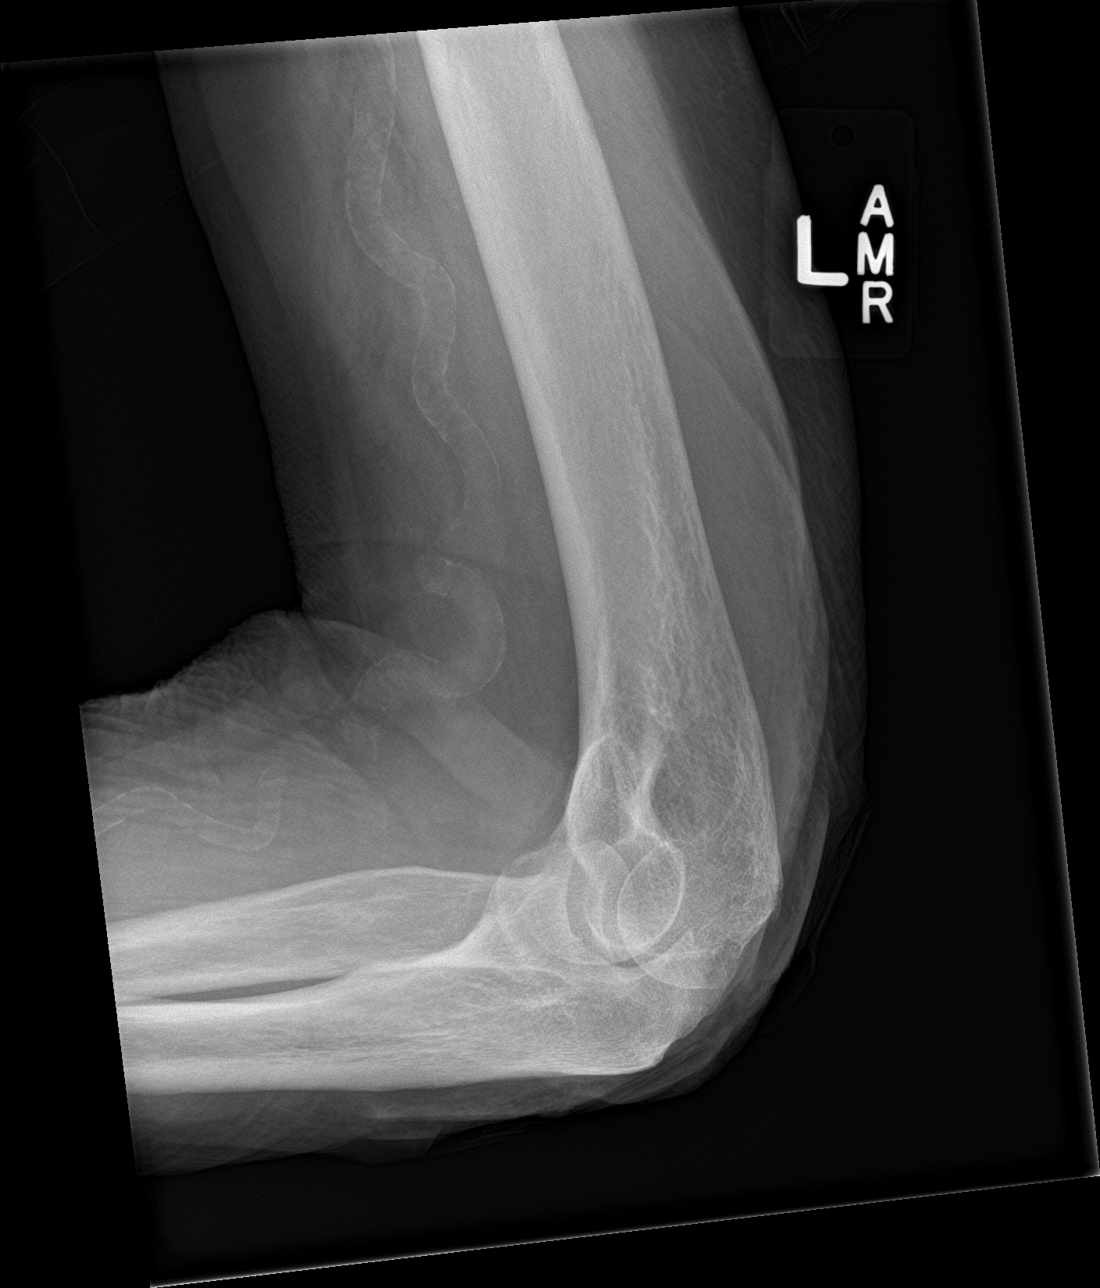

[4 of 4 positions shown; findings below may reference images not displayed]

FINDINGS: No visible fracture, subluxation or dislocation. No visible joint
effusion although the lateral view is suboptimal due to positioning.
Vascular calcifications noted.
IMPRESSION: No acute bony abnormality visualized.

## 2017-09-05 IMAGING — DX DG HIP (WITH OR WITHOUT PELVIS) 2-3V*L*
3 series · 3 of 3 positions shown · non-contrast
Comparison: Concurrently obtained radiographs of the left elbow

CLINICAL DATA: [AGE] male with left hip pain after falling
last night

EXAM:
DG HIP (WITH OR WITHOUT PELVIS) 2-3V LEFT

[pelvis ap]
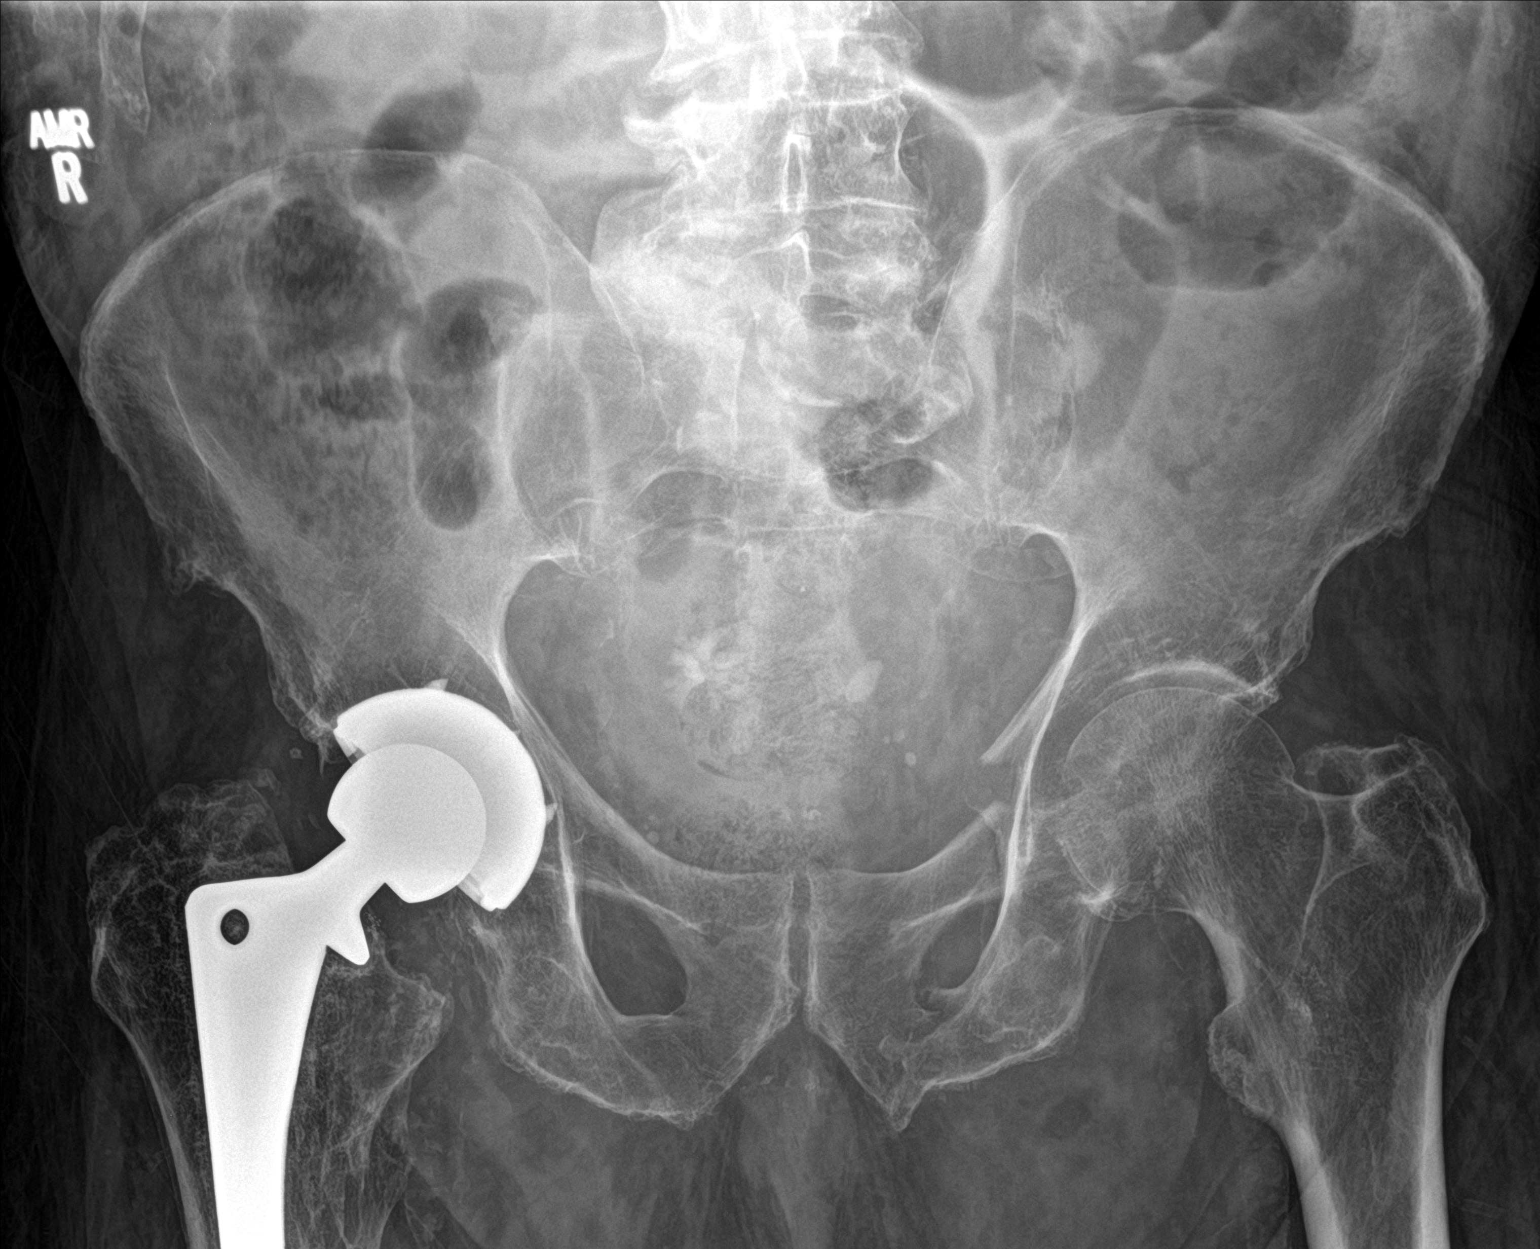

[hip ap]
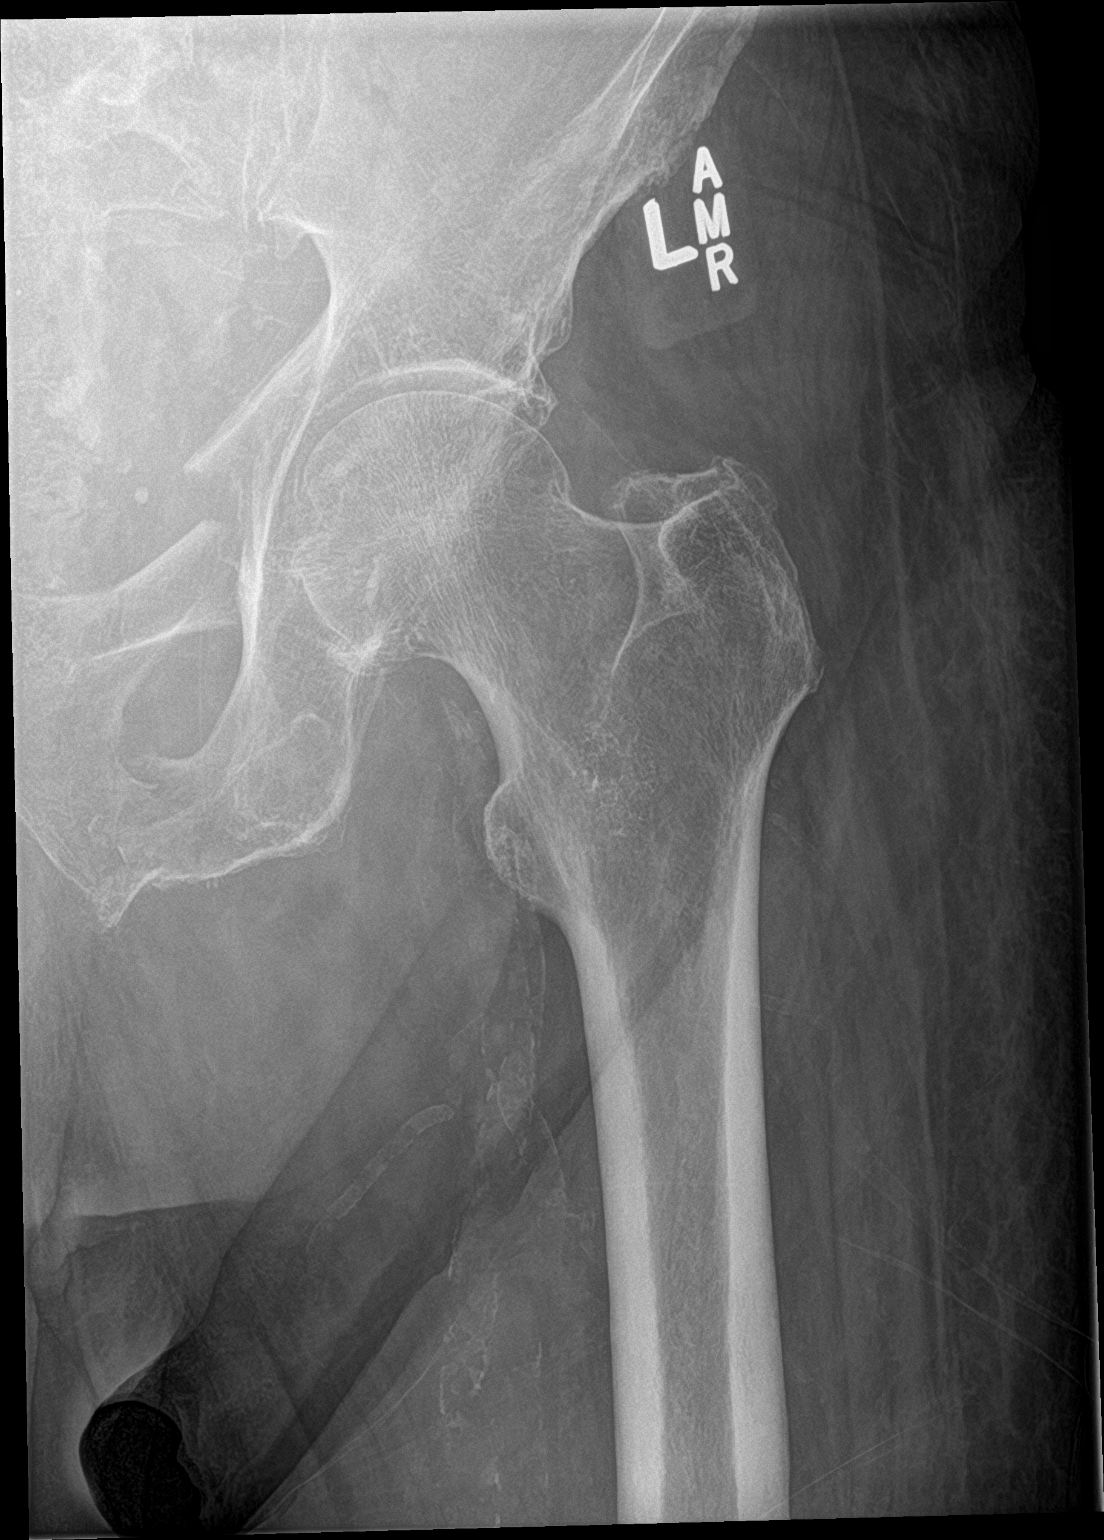

[hip lat]
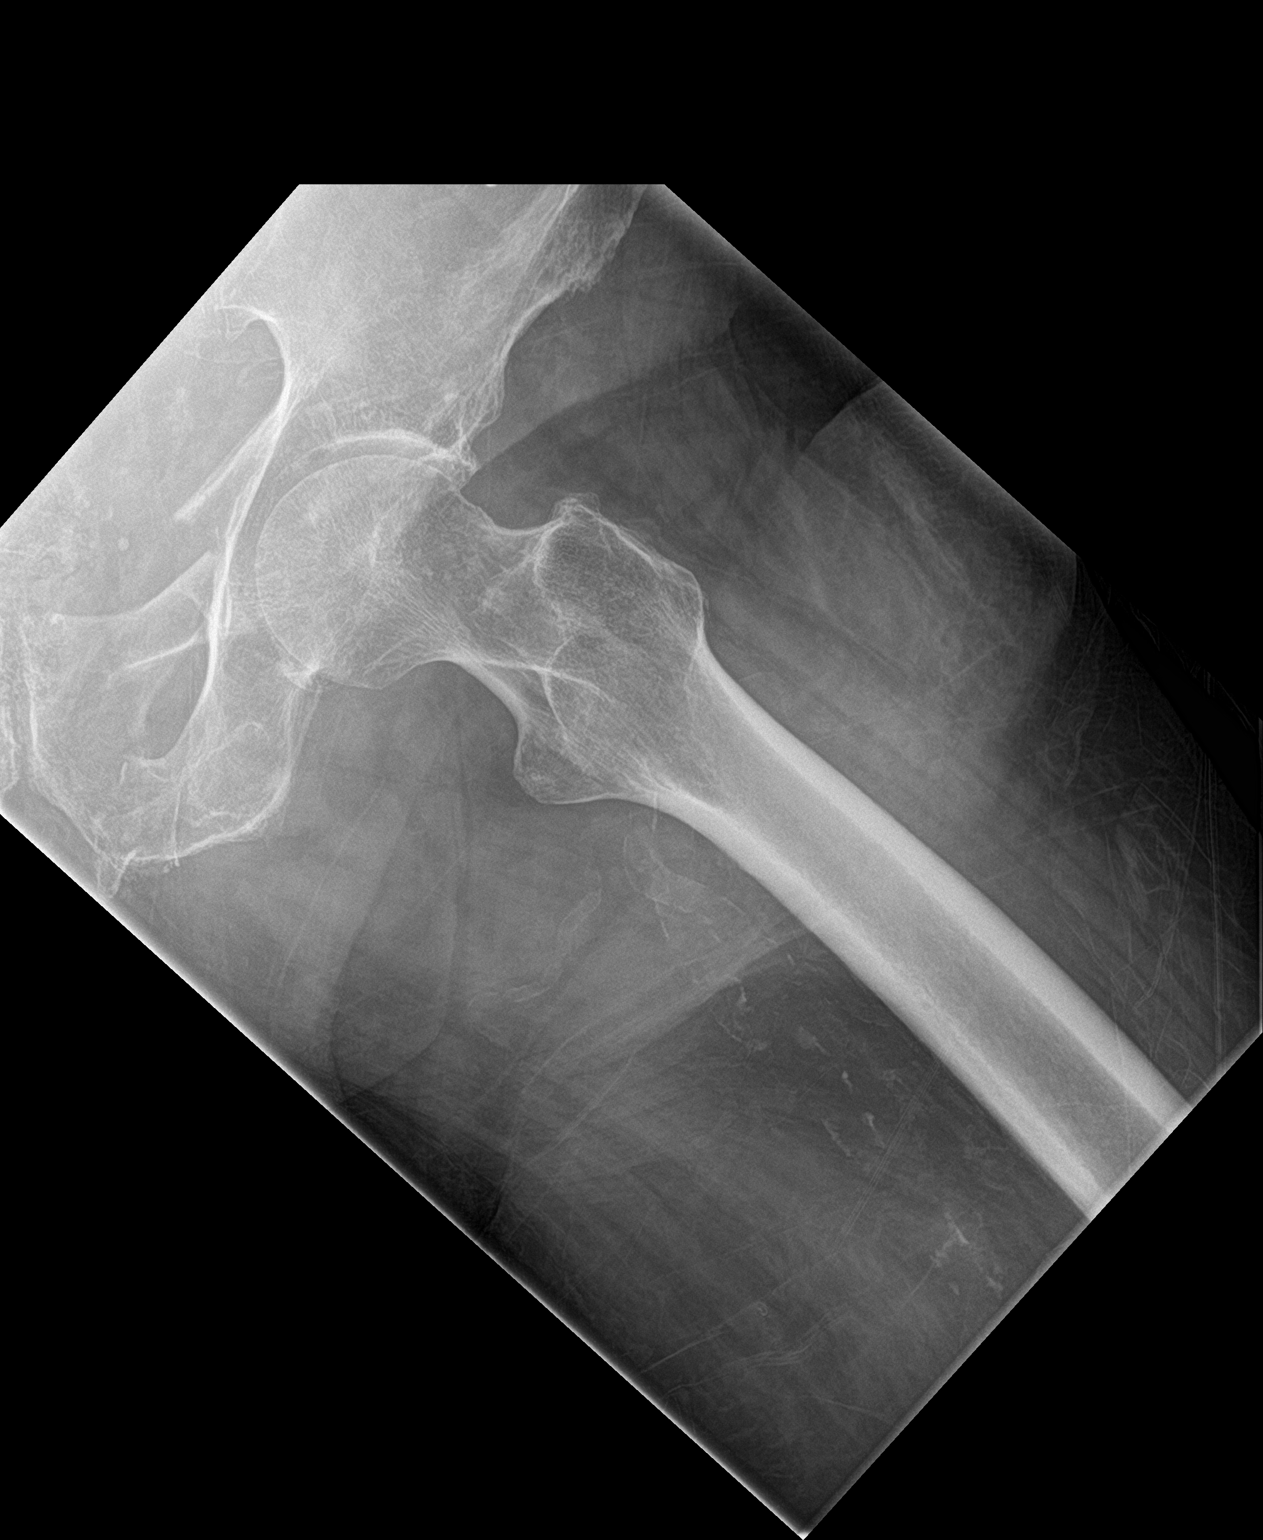

[3 of 3 positions shown; findings below may reference images not displayed]

FINDINGS: The bones appear diffusely demineralized. There is a displaced
fracture through the left superior pubic ramus and a minimally
displaced fracture through the inferior pubic ramus. The left
proximal femur remains intact and the humeral head remains located.
Surgical changes of prior right total hip arthroplasty without
evidence of hardware complication. Exuberant callus present inferior
to the lesser trochanter. Atherosclerotic calcifications present in
the bilateral common femoral arteries. Multilevel degenerative disc
disease visualized in the lumbar spine. The visualized bowel gas
pattern is unremarkable.
IMPRESSION: 1. Displaced fracture of the left superior pubic ramus.
2. Minimally displaced fracture of the left inferior pubic ramus.
3. Surgical changes of prior right total hip arthroplasty without
evidence of hardware complication.
4. The bones appear diffusely demineralized.
5. Lower lumbar degenerative disc disease.

## 2017-11-26 DEATH — deceased
